# Patient Record
Sex: Female | Born: 1960
Health system: Southern US, Community
[De-identification: ages and names within clinical notes are randomized; demographics above are authoritative.]

## PROBLEM LIST (undated history)

## (undated) DIAGNOSIS — I10 Essential (primary) hypertension: Secondary | ICD-10-CM

## (undated) DIAGNOSIS — E669 Obesity, unspecified: Secondary | ICD-10-CM

## (undated) DIAGNOSIS — M329 Systemic lupus erythematosus, unspecified: Secondary | ICD-10-CM

## (undated) DIAGNOSIS — R000 Tachycardia, unspecified: Secondary | ICD-10-CM

## (undated) DIAGNOSIS — G4733 Obstructive sleep apnea (adult) (pediatric): Secondary | ICD-10-CM

## (undated) DIAGNOSIS — Z720 Tobacco use: Secondary | ICD-10-CM

## (undated) DIAGNOSIS — I77810 Thoracic aortic ectasia: Secondary | ICD-10-CM

## (undated) DIAGNOSIS — H9190 Unspecified hearing loss, unspecified ear: Secondary | ICD-10-CM

## (undated) DIAGNOSIS — E785 Hyperlipidemia, unspecified: Secondary | ICD-10-CM

## (undated) DIAGNOSIS — I2510 Atherosclerotic heart disease of native coronary artery without angina pectoris: Secondary | ICD-10-CM

## (undated) DIAGNOSIS — M797 Fibromyalgia: Secondary | ICD-10-CM

## (undated) DIAGNOSIS — I272 Pulmonary hypertension, unspecified: Secondary | ICD-10-CM

## (undated) HISTORY — DX: Tachycardia, unspecified: R00.0

## (undated) HISTORY — DX: Hyperlipidemia, unspecified: E78.5

## (undated) HISTORY — DX: Obesity, unspecified: E66.9

## (undated) HISTORY — PX: CHOLECYSTECTOMY: SHX55

## (undated) HISTORY — DX: Obstructive sleep apnea (adult) (pediatric): G47.33

## (undated) HISTORY — DX: Thoracic aortic ectasia: I77.810

## (undated) HISTORY — DX: Atherosclerotic heart disease of native coronary artery without angina pectoris: I25.10

## (undated) HISTORY — DX: Pulmonary hypertension, unspecified: I27.20

---

## 1998-08-03 ENCOUNTER — Emergency Department (HOSPITAL_COMMUNITY): Admission: EM | Admit: 1998-08-03 | Discharge: 1998-08-03 | Payer: Self-pay | Admitting: Emergency Medicine

## 1998-08-03 ENCOUNTER — Encounter: Payer: Self-pay | Admitting: Emergency Medicine

## 2012-12-16 ENCOUNTER — Other Ambulatory Visit: Payer: Self-pay | Admitting: Rheumatology

## 2012-12-16 ENCOUNTER — Ambulatory Visit
Admission: RE | Admit: 2012-12-16 | Discharge: 2012-12-16 | Disposition: A | Discharge status: Home / Self Care | Source: Ambulatory Visit | Attending: Rheumatology | Admitting: Rheumatology

## 2012-12-16 DIAGNOSIS — M79605 Pain in left leg: Secondary | ICD-10-CM

## 2012-12-16 DIAGNOSIS — R609 Edema, unspecified: Secondary | ICD-10-CM

## 2013-07-15 ENCOUNTER — Other Ambulatory Visit: Payer: Self-pay | Admitting: Pain Medicine

## 2013-07-15 ENCOUNTER — Ambulatory Visit
Admission: RE | Admit: 2013-07-15 | Discharge: 2013-07-15 | Disposition: A | Discharge status: Home / Self Care | Source: Ambulatory Visit | Attending: Pain Medicine | Admitting: Pain Medicine

## 2013-07-15 DIAGNOSIS — M25511 Pain in right shoulder: Secondary | ICD-10-CM

## 2013-07-15 DIAGNOSIS — M25562 Pain in left knee: Secondary | ICD-10-CM

## 2013-07-15 DIAGNOSIS — M542 Cervicalgia: Secondary | ICD-10-CM

## 2013-07-15 DIAGNOSIS — M25561 Pain in right knee: Secondary | ICD-10-CM

## 2013-08-09 ENCOUNTER — Other Ambulatory Visit: Payer: Self-pay | Admitting: Pain Medicine

## 2013-08-09 DIAGNOSIS — R51 Headache: Principal | ICD-10-CM

## 2013-08-09 DIAGNOSIS — R519 Headache, unspecified: Secondary | ICD-10-CM

## 2013-08-10 ENCOUNTER — Inpatient Hospital Stay
Admission: RE | Admit: 2013-08-10 | Discharge: 2013-08-10 | Disposition: A | Discharge status: Home / Self Care | Source: Ambulatory Visit | Attending: Pain Medicine | Admitting: Pain Medicine

## 2013-08-16 ENCOUNTER — Encounter (INDEPENDENT_AMBULATORY_CARE_PROVIDER_SITE_OTHER): Payer: Self-pay

## 2013-08-16 ENCOUNTER — Ambulatory Visit
Admission: RE | Admit: 2013-08-16 | Discharge: 2013-08-16 | Disposition: A | Discharge status: Home / Self Care | Source: Ambulatory Visit | Attending: Pain Medicine | Admitting: Pain Medicine

## 2013-08-16 DIAGNOSIS — R51 Headache: Principal | ICD-10-CM

## 2013-08-16 DIAGNOSIS — R519 Headache, unspecified: Secondary | ICD-10-CM

## 2013-10-03 ENCOUNTER — Other Ambulatory Visit: Payer: Self-pay | Admitting: Physician Assistant

## 2013-10-03 ENCOUNTER — Ambulatory Visit
Admission: RE | Admit: 2013-10-03 | Discharge: 2013-10-03 | Disposition: A | Discharge status: Home / Self Care | Source: Ambulatory Visit | Attending: Physician Assistant | Admitting: Physician Assistant

## 2013-10-03 DIAGNOSIS — R52 Pain, unspecified: Secondary | ICD-10-CM

## 2013-11-30 ENCOUNTER — Other Ambulatory Visit (HOSPITAL_COMMUNITY): Payer: Self-pay | Admitting: Rheumatology

## 2013-11-30 DIAGNOSIS — R609 Edema, unspecified: Secondary | ICD-10-CM

## 2013-12-01 ENCOUNTER — Ambulatory Visit (HOSPITAL_COMMUNITY): Attending: Rheumatology | Admitting: Radiology

## 2013-12-01 DIAGNOSIS — R609 Edema, unspecified: Secondary | ICD-10-CM | POA: Insufficient documentation

## 2013-12-01 DIAGNOSIS — I10 Essential (primary) hypertension: Secondary | ICD-10-CM | POA: Insufficient documentation

## 2013-12-01 NOTE — Progress Notes (Signed)
Echocardiogram performed.  

## 2014-01-12 ENCOUNTER — Ambulatory Visit (HOSPITAL_COMMUNITY)
Admission: RE | Admit: 2014-01-12 | Discharge: 2014-01-12 | Disposition: A | Discharge status: Home / Self Care | Source: Ambulatory Visit | Attending: Internal Medicine | Admitting: Internal Medicine

## 2014-01-12 ENCOUNTER — Encounter (HOSPITAL_COMMUNITY): Payer: Self-pay

## 2014-01-12 VITALS — BP 164/102 | HR 120 | Wt 253.8 lb

## 2014-01-12 DIAGNOSIS — I471 Supraventricular tachycardia: Secondary | ICD-10-CM

## 2014-01-12 DIAGNOSIS — I272 Pulmonary hypertension, unspecified: Secondary | ICD-10-CM

## 2014-01-12 DIAGNOSIS — R000 Tachycardia, unspecified: Secondary | ICD-10-CM | POA: Insufficient documentation

## 2014-01-12 DIAGNOSIS — I270 Primary pulmonary hypertension: Secondary | ICD-10-CM

## 2014-01-12 DIAGNOSIS — R0683 Snoring: Secondary | ICD-10-CM

## 2014-01-12 HISTORY — DX: Essential (primary) hypertension: I10

## 2014-01-12 HISTORY — DX: Fibromyalgia: M79.7

## 2014-01-12 HISTORY — DX: Morbid (severe) obesity due to excess calories: E66.01

## 2014-01-12 HISTORY — DX: Tobacco use: Z72.0

## 2014-01-12 HISTORY — DX: Unspecified hearing loss, unspecified ear: H91.90

## 2014-01-12 HISTORY — DX: Systemic lupus erythematosus, unspecified: M32.9

## 2014-01-12 MED ORDER — POTASSIUM CHLORIDE ER 20 MEQ PO TBCR: 20.0000 meq | EXTENDED_RELEASE_TABLET | Freq: Every day | ORAL | Status: DC

## 2014-01-12 MED ORDER — LISINOPRIL 40 MG PO TABS: 40.0000 mg | ORAL_TABLET | Freq: Every day | ORAL | Status: DC

## 2014-01-12 MED ORDER — FUROSEMIDE 20 MG PO TABS: 20.0000 mg | ORAL_TABLET | Freq: Every day | ORAL | Status: DC

## 2014-01-12 NOTE — Progress Notes (Signed)
Patient ID: Letta KocherRobin Short, female   DOB: 08/14/1960, 53 y.o.   MRN: [id number]  Referring Physician: Dr. Dareen PianoAnderson   HPI:  [name] is a 53 y/o morbidly obese woman with severe HTN, SLE, previous tobacco use (quit 7/15) and fibromyalgia referred by Dr. Dareen PianoAnderson for further evaluation of pulmonary HTN on echo.   Says she gained a lot of weight on prednisone 10 years. Since that time has become more short of breath. And also has had LE edema. Saw PCP and started on lasix. Says she can walk whole grocery store without stopping. + Orthopnea. Snores heavily and wakes herself up. No CP.   Used to smoke 1/2 ppd but quit 7/15    Echo 11/21/13: LVEF 50-55% mild lvh with grade 1 DD. RV normal. Trivial TR. RVSP 32   Review of Systems: [y] = yes, [ ]  = no   General: Weight gain [Y ]; Weight loss [ ] ; Anorexia [ ] ; Fatigue [Y]; Fever [ ] ; Chills [ ] ; Weakness [ ]   Cardiac: Chest pain/pressure [ ] ; Resting SOB [ ] ; Exertional SOB [Y ]; Orthopnea [Y ]; Pedal Edema [Y ]; Palpitations [ ] ; Syncope [ ] ; Presyncope [ ] ; Paroxysmal nocturnal dyspnea[ ]   Pulmonary: Cough [ ] ; Wheezing[ ] ; Hemoptysis[ ] ; Sputum [ ] ; Snoring [ ]   GI: Vomiting[ ] ; Dysphagia[ ] ; Melena[ ] ; Hematochezia [ ] ; Heartburn[ ] ; Abdominal pain [ ] ; Constipation [ ] ; Diarrhea [ ] ; BRBPR [ ]   GU: Hematuria[ ] ; Dysuria [ ] ; Nocturia[ ]   Vascular: Pain in legs with walking [ ] ; Pain in feet with lying flat [ ] ; Non-healing sores [ ] ; Stroke [ ] ; TIA [ ] ; Slurred speech [ ] ;  Neuro: Headaches[ ] ; Vertigo[ ] ; Seizures[ ] ; Paresthesias[ ] ;Blurred vision [ ] ; Diplopia [ ] ; Vision changes [ ]   Ortho/Skin: Arthritis [Y ]; Joint pain [Y ]; Muscle pain [ Y]; Joint swelling [ ] ; Back Pain [ ] ; Rash [Y ]  Psych: Depression[Y ]; Anxiety[ Y]  Heme: Bleeding problems [ ] ; Clotting disorders [ ] ; Anemia [ ]   Endocrine: Diabetes [ ] ; Thyroid dysfunction[ ]    Past Medical History  Diagnosis Date  . Morbid obesity   . Hypertension   . SLE (systemic lupus  erythematosus)   . Tobacco use   . Fibromyalgia     Current Outpatient Prescriptions  Medication Sig Dispense Refill  . chlorzoxazone (PARAFON) 500 MG tablet Take 500 mg by mouth 4 (four) times daily as needed for muscle spasms.      . cholecalciferol (VITAMIN D) 1000 UNITS tablet Take 1,000 Units by mouth daily.      . DULoxetine (CYMBALTA) 60 MG capsule Take 60 mg by mouth 2 (two) times daily.      . Fish Oil-Cholecalciferol (FISH OIL + D3 PO) Take by mouth.      . furosemide (LASIX) 20 MG tablet Take 20 mg by mouth daily.      . hydroxychloroquine (PLAQUENIL) 200 MG tablet Take 200 mg by mouth daily.      Marland Kitchen. lisinopril (PRINIVIL,ZESTRIL) 20 MG tablet Take 20 mg by mouth daily.      . mirtazapine (REMERON) 30 MG tablet Take 15 mg by mouth at bedtime.      . nortriptyline (PAMELOR) 50 MG capsule Take 50 mg by mouth at bedtime.      Marland Kitchen. nystatin (MYCOSTATIN) 100000 UNIT/ML suspension Take 5 mLs by mouth 4 (four) times daily.      . potassium chloride (K-DUR) 10 MEQ tablet Take  10 mEq by mouth daily.      . predniSONE (DELTASONE) 10 MG tablet Take 10 mg by mouth 2 (two) times daily with a meal.      . tapentadol (NUCYNTA) 50 MG TABS tablet Take 25 mg by mouth every 8 (eight) hours.      . traZODone (DESYREL) 50 MG tablet Take 50 mg by mouth at bedtime.       No current facility-administered medications for this encounter.    No Known Allergies    History   Social History  . Marital Status: Legally Separated    Spouse Name: N/A    Number of Children: 1  . Years of Education: N/A   Occupational History  . Disabled    Social History Main Topics  . Smoking status: Former Games developermoker  . Smokeless tobacco: Not on file  . Alcohol Use: No  . Drug Use: No  . Sexual Activity: Not on file   Other Topics Concern  . Not on file   Social History Narrative  . No narrative on file      Family History  Problem Relation Age of Onset  . Coronary artery disease Father   Mother with  SLE  Filed Vitals:   01/12/14 1202  BP: 164/102  Pulse: 120  Weight: 253 lb 12.8 oz (115.123 kg)  SpO2: 97%   Ambulatory sats 94-97% with hall walk.  PHYSICAL EXAM: General:  Obese female No respiratory difficulty HEENT: normal Neck: supple.JVP 9  Carotids 2+ bilat; no bruits. No lymphadenopathy or thryomegaly appreciated. Airway IV Cor: PMI nonpalpable  Tachy regular. No rubs, gallops or murmurs. Lungs: clear Abdomen: obese soft, nontender, nondistended. No hepatosplenomegaly. No bruits or masses. Good bowel sounds. Extremities: no cyanosis, clubbing, rash, 1+ edema Neuro: alert & oriented x 3, cranial nerves grossly intact. moves all 4 extremities w/o difficulty. Affect pleasant.  ECG:   ASSESSMENT & PLAN: 1. Morbid obesity 2. Severe HTN 3. Pulmonary HTN by echo 4. Probable OSA  She does have mild pulmonary HTN by echo but I suspect this is more related to her obesity, diastolic HF, severe HTN and OSA than her lupus. She is currently fluid overloaded and quite hypertensive. Will do the following:  1) Increase lasix to 40 daily. Increase KCL 20 to 20 daily 2) Increase lisinopril to 40 daily 3) Refer for sleep study 4) Place support hose 5) Encouraged her to lose weight with low carb diet 6) Check PFTs  7) BMET in 1 week 8) Watch fluid intake  See back 3-4 weeks for titration of anti-HTN meds. Can repeat echo in several months and if pulmonary pressures still up can consider RHC.   Reuel BoomDaniel Bensimhon,MD 12:34 PM

## 2014-01-12 NOTE — Patient Instructions (Signed)
Increase Furosemide (Lasix) to 40 mg daily  Increase Potassium to 20 meq daily  Increase Lisinopril to 40 mg daily  Labs in 1 week  You have been referred to Pulmonary for a sleep evaluation, Dr Shelle Ironlance 02/21/14 at 3 pm, please arrive at 2:45 for paperwork  Your physician has recommended that you have a pulmonary function test. Pulmonary Function Tests are a group of tests that measure how well air moves in and out of your lungs.  Your physician recommends that you schedule a follow-up appointment in: 3-4 weeks

## 2014-01-24 ENCOUNTER — Ambulatory Visit (HOSPITAL_COMMUNITY)
Admission: RE | Admit: 2014-01-24 | Discharge: 2014-01-24 | Disposition: A | Discharge status: Home / Self Care | Source: Ambulatory Visit | Attending: Cardiology | Admitting: Cardiology

## 2014-01-24 ENCOUNTER — Ambulatory Visit (HOSPITAL_COMMUNITY)
Admission: RE | Admit: 2014-01-24 | Discharge: 2014-01-24 | Disposition: A | Discharge status: Home / Self Care | Source: Ambulatory Visit | Attending: Internal Medicine | Admitting: Internal Medicine

## 2014-01-24 DIAGNOSIS — I5022 Chronic systolic (congestive) heart failure: Secondary | ICD-10-CM | POA: Diagnosis present

## 2014-01-24 DIAGNOSIS — I270 Primary pulmonary hypertension: Secondary | ICD-10-CM | POA: Diagnosis not present

## 2014-01-24 DIAGNOSIS — I272 Pulmonary hypertension, unspecified: Secondary | ICD-10-CM

## 2014-01-24 LAB — PULMONARY FUNCTION TEST
DL/VA % pred: 105 %
DL/VA: 5.21 ml/min/mmHg/L
DLCO UNC: 22.22 ml/min/mmHg
DLCO cor % pred: 86 %
DLCO cor: 22.22 ml/min/mmHg
DLCO unc % pred: 86 %
FEF 25-75 Post: 3.02 L/sec
FEF 25-75 Pre: 2.65 L/sec
FEF2575-%Change-Post: 13 %
FEF2575-%PRED-PRE: 99 %
FEF2575-%Pred-Post: 113 %
FEV1-%Change-Post: 1 %
FEV1-%Pred-Post: 88 %
FEV1-%Pred-Pre: 86 %
FEV1-POST: 2.48 L
FEV1-Pre: 2.44 L
FEV1FVC-%Change-Post: 1 %
FEV1FVC-%Pred-Pre: 104 %
FEV6-%CHANGE-POST: 0 %
FEV6-%PRED-PRE: 84 %
FEV6-%Pred-Post: 84 %
FEV6-PRE: 2.94 L
FEV6-Post: 2.95 L
FEV6FVC-%Change-Post: 0 %
FEV6FVC-%PRED-POST: 103 %
FEV6FVC-%Pred-Pre: 102 %
FVC-%Change-Post: 0 %
FVC-%PRED-POST: 82 %
FVC-%Pred-Pre: 82 %
FVC-POST: 2.95 L
FVC-Pre: 2.95 L
PRE FEV6/FVC RATIO: 100 %
Post FEV1/FVC ratio: 84 %
Post FEV6/FVC ratio: 100 %
Pre FEV1/FVC ratio: 83 %
RV % PRED: 66 %
RV: 1.27 L
TLC % pred: 82 %
TLC: 4.27 L

## 2014-01-24 LAB — BASIC METABOLIC PANEL
ANION GAP: 10 (ref 5–15)
BUN: 13 mg/dL (ref 6–23)
CHLORIDE: 100 meq/L (ref 96–112)
CO2: 27 meq/L (ref 19–32)
Calcium: 8.9 mg/dL (ref 8.4–10.5)
Creatinine, Ser: 0.83 mg/dL (ref 0.50–1.10)
GFR calc Af Amer: >90 mL/min (ref >90)
GFR calc non Af Amer: 79 mL/min — ABNORMAL LOW (ref >90)
Glucose, Bld: 85 mg/dL (ref 70–99)
Potassium: 5.8 mEq/L — ABNORMAL HIGH (ref 3.7–5.3)
Sodium: 137 mEq/L (ref 137–147)

## 2014-01-24 MED ORDER — ALBUTEROL SULFATE (2.5 MG/3ML) 0.083% IN NEBU
2.5000 mg | INHALATION_SOLUTION | Freq: Once | RESPIRATORY_TRACT | Status: AC
  Administered 2014-01-24: 2.5 mg via RESPIRATORY_TRACT

## 2014-02-01 ENCOUNTER — Ambulatory Visit (HOSPITAL_COMMUNITY)
Admission: RE | Admit: 2014-02-01 | Discharge: 2014-02-01 | Disposition: A | Discharge status: Home / Self Care | Source: Ambulatory Visit | Attending: Cardiology | Admitting: Cardiology

## 2014-02-01 VITALS — BP 140/84 | HR 98 | Wt 257.5 lb

## 2014-02-01 DIAGNOSIS — H9190 Unspecified hearing loss, unspecified ear: Secondary | ICD-10-CM | POA: Diagnosis not present

## 2014-02-01 DIAGNOSIS — I5032 Chronic diastolic (congestive) heart failure: Secondary | ICD-10-CM | POA: Insufficient documentation

## 2014-02-01 DIAGNOSIS — I272 Other secondary pulmonary hypertension: Secondary | ICD-10-CM | POA: Diagnosis not present

## 2014-02-01 DIAGNOSIS — Z79899 Other long term (current) drug therapy: Secondary | ICD-10-CM | POA: Diagnosis not present

## 2014-02-01 DIAGNOSIS — M797 Fibromyalgia: Secondary | ICD-10-CM | POA: Insufficient documentation

## 2014-02-01 DIAGNOSIS — I5030 Unspecified diastolic (congestive) heart failure: Secondary | ICD-10-CM | POA: Insufficient documentation

## 2014-02-01 DIAGNOSIS — I10 Essential (primary) hypertension: Secondary | ICD-10-CM | POA: Insufficient documentation

## 2014-02-01 DIAGNOSIS — M329 Systemic lupus erythematosus, unspecified: Secondary | ICD-10-CM | POA: Diagnosis not present

## 2014-02-01 DIAGNOSIS — Z87891 Personal history of nicotine dependence: Secondary | ICD-10-CM | POA: Diagnosis not present

## 2014-02-01 LAB — BASIC METABOLIC PANEL
Anion gap: 14 (ref 5–15)
BUN: 9 mg/dL (ref 6–23)
CHLORIDE: 101 meq/L (ref 96–112)
CO2: 23 meq/L (ref 19–32)
Calcium: 9.6 mg/dL (ref 8.4–10.5)
Creatinine, Ser: 0.75 mg/dL (ref 0.50–1.10)
GFR calc Af Amer: >90 mL/min (ref >90)
GFR calc non Af Amer: >90 mL/min (ref >90)
GLUCOSE: 89 mg/dL (ref 70–99)
POTASSIUM: 5 meq/L (ref 3.7–5.3)
Sodium: 138 mEq/L (ref 137–147)

## 2014-02-01 LAB — PRO B NATRIURETIC PEPTIDE: PRO B NATRI PEPTIDE: 55.1 pg/mL (ref 0–125)

## 2014-02-01 MED ORDER — FUROSEMIDE 40 MG PO TABS: ORAL_TABLET | ORAL | Status: DC

## 2014-02-01 MED ORDER — FUROSEMIDE 40 MG PO TABS: 40.0000 mg | ORAL_TABLET | Freq: Every day | ORAL | Status: DC

## 2014-02-01 MED ORDER — AMLODIPINE BESYLATE 5 MG PO TABS: 5.0000 mg | ORAL_TABLET | Freq: Every day | ORAL | Status: DC

## 2014-02-01 NOTE — Patient Instructions (Signed)
Start Amlodipine 5 mg daily  Increase Furosemide (Lasix) to 40 mg (1 tab) in AM and 20 mg (1/2 tab) in PM  Labs today  Your physician recommends that you schedule a follow-up appointment in: 1 month

## 2014-02-01 NOTE — Progress Notes (Signed)
Patient ID: Kylie KocherRobin [name], female   DOB: 11/14/1960, 53 y.o.   MRN: [id number]  Referring Physician: Dr. Dareen PianoAnderson  HPI:  Kylie [name] is a 53 y/o morbidly obese woman with severe HTN, SLE, previous tobacco use (quit 7/15) and fibromyalgia referred by Dr. Dareen PianoAnderson for further evaluation of pulmonary HTN on echo.   Says she gained a lot of weight on prednisone 10 years. Since that time has become more [name]. And also has had LE edema. Saw PCP and started on lasix.   Used to smoke 1/2 ppd but quit 7/15   At last appointment, Lasix was increased to 40 mg daily due to volume overload and lisinopril was increased to control blood pressure.  She says that her lower extremity edema is improved.  She is wearing support hose.  She is [name] walking up steps or hills.  She can walk on flat ground without dyspnea.  SBP still runs in the 150s when she checks at home.   PFTs in 11/15 were normal.   Echo 11/21/13: LVEF 50-55% mild lvh with grade 1 DD. RV normal. Trivial TR. RVSP 32  Labs (11/15): K 5.8 (?hemolyzed), creatinine 0.83  Review of Systems: All systems reviewed and negative except as per HPI.   Past Medical History  Diagnosis Date  . Morbid obesity   . Hypertension   . SLE (systemic lupus erythematosus)   . Tobacco use   . Fibromyalgia   . Hearing loss     Current Outpatient Prescriptions  Medication Sig Dispense Refill  . chlorzoxazone (PARAFON) 500 MG tablet Take 500 mg by mouth 4 (four) times daily as needed for muscle spasms.    . cholecalciferol (VITAMIN D) 1000 UNITS tablet Take 1,000 Units by mouth daily.    . DULoxetine (CYMBALTA) 60 MG capsule Take 60 mg by mouth 2 (two) times daily.    . Fish Oil-Cholecalciferol (FISH OIL + D3 PO) Take by mouth.    . furosemide (LASIX) 40 MG tablet Take 1 tab in AM and 1/2 tab in PM 45 tablet 3  . hydroxychloroquine (PLAQUENIL) 200 MG tablet Take 200 mg by mouth daily.    . indomethacin (INDOCIN) 25 MG capsule Take 25 mg by mouth 3  (three) times daily as needed.    Marland Kitchen. lisinopril (PRINIVIL,ZESTRIL) 40 MG tablet Take 1 tablet (40 mg total) by mouth daily.    . mirtazapine (REMERON) 30 MG tablet Take 15 mg by mouth at bedtime.    . nortriptyline (PAMELOR) 50 MG capsule Take 50 mg by mouth at bedtime.    Marland Kitchen. nystatin (MYCOSTATIN) 100000 UNIT/ML suspension Take 5 mLs by mouth 4 (four) times daily.    . potassium chloride 20 MEQ TBCR Take 20 mEq by mouth daily.    . predniSONE (DELTASONE) 10 MG tablet Take 10 mg by mouth daily with breakfast.     . tapentadol (NUCYNTA) 50 MG TABS tablet Take 25 mg by mouth every 8 (eight) hours.    . traZODone (DESYREL) 50 MG tablet Take 50 mg by mouth at bedtime.    Marland Kitchen. amLODipine (NORVASC) 5 MG tablet Take 1 tablet (5 mg total) by mouth daily. 30 tablet 3   No current facility-administered medications for this encounter.    No Known Allergies    History   Social History  . Marital Status: Legally Separated    Spouse Name: N/A    Number of Children: 1  . Years of Education: N/A   Occupational History  .  Disabled    Social History Main Topics  . Smoking status: Former Games developermoker  . Smokeless tobacco: Not on file  . Alcohol Use: No  . Drug Use: No  . Sexual Activity: Not on file   Other Topics Concern  . Not on file   Social History Narrative  . No narrative on file      Family History  Problem Relation Age of Onset  . Coronary artery disease Father   Mother with SLE  Filed Vitals:   02/01/14 0959  BP: 140/84  Pulse: 98  Weight: 257 lb 8 oz (116.801 kg)  SpO2: 100%   Ambulatory sats 94-97% with hall walk at 10/15 appt.  PHYSICAL EXAM: General:  Obese female No respiratory difficulty HEENT: normal Neck: supple.  JVP 8 cm. Carotids 2+ bilat; no bruits. No lymphadenopathy or thryomegaly appreciated.  Cor: PMI nonpalpable  Regular S1S2. No rubs, gallops or murmurs. Lungs: clear Abdomen: obese soft, nontender, nondistended. No hepatosplenomegaly. No bruits or masses.  Good bowel sounds. Extremities: no cyanosis, clubbing, rash, 1+ edema 1/2 up lower legs bilaterally Neuro: alert & oriented x 3, cranial nerves grossly intact. moves all 4 extremities w/o difficulty. Affect pleasant.   ASSESSMENT & PLAN: 1. Morbid obesity 2. Severe HTN 3. Pulmonary HTN by echo (mild) 4. Probable OSA 5. Chronic diastolic CHF  She does have mild pulmonary HTN by echo but I suspect this is more related to her obesity, diastolic HF, severe HTN and OSA than her lupus. At last appointment, Lasix was increased and lisinopril was increased to control BP better.  She still has some volume overload on exam and BP still runs high.  I plan the following:   1) Increase lasix to 40 qam, 20 qpm. BMET/BNP today (suspect K on last BMET was hemolyzed).  2) Continue lisinopril, add amlodipine 5 mg daily and watch for worsening peripheral edema. She will take her BP daily and record, bringing BPs to next appt.  3) Awaiting sleep study.  4) Continue use of support hose.  5) Encouraged her to lose weight with low carb diet  See back 1 month for titration of anti-HTN meds and reassessment of volume. Can repeat echo in several months and if pulmonary pressures still up can consider RHC.   Michelina Mexicano,MD 02/01/2014

## 2014-02-13 ENCOUNTER — Telehealth (HOSPITAL_COMMUNITY): Payer: Self-pay | Admitting: Vascular Surgery

## 2014-02-13 DIAGNOSIS — I5022 Chronic systolic (congestive) heart failure: Secondary | ICD-10-CM

## 2014-02-13 NOTE — Telephone Encounter (Signed)
REFILL Lisinopril

## 2014-02-14 MED ORDER — LISINOPRIL 40 MG PO TABS: 40.0000 mg | ORAL_TABLET | Freq: Every day | ORAL | Status: DC

## 2014-02-21 ENCOUNTER — Institutional Professional Consult (permissible substitution): Admitting: Pulmonary Disease

## 2014-03-03 ENCOUNTER — Ambulatory Visit (HOSPITAL_COMMUNITY)
Admission: RE | Admit: 2014-03-03 | Discharge: 2014-03-03 | Disposition: A | Discharge status: Home / Self Care | Source: Ambulatory Visit | Attending: Internal Medicine | Admitting: Internal Medicine

## 2014-03-03 VITALS — BP 122/78 | HR 109 | Wt 260.8 lb

## 2014-03-03 DIAGNOSIS — Z79899 Other long term (current) drug therapy: Secondary | ICD-10-CM | POA: Insufficient documentation

## 2014-03-03 DIAGNOSIS — Z87891 Personal history of nicotine dependence: Secondary | ICD-10-CM | POA: Diagnosis not present

## 2014-03-03 DIAGNOSIS — I10 Essential (primary) hypertension: Secondary | ICD-10-CM | POA: Diagnosis not present

## 2014-03-03 DIAGNOSIS — G4733 Obstructive sleep apnea (adult) (pediatric): Secondary | ICD-10-CM | POA: Insufficient documentation

## 2014-03-03 DIAGNOSIS — I272 Other secondary pulmonary hypertension: Secondary | ICD-10-CM | POA: Insufficient documentation

## 2014-03-03 DIAGNOSIS — I5032 Chronic diastolic (congestive) heart failure: Secondary | ICD-10-CM | POA: Diagnosis present

## 2014-03-03 LAB — BASIC METABOLIC PANEL
Anion gap: 13 (ref 5–15)
BUN: 10 mg/dL (ref 6–23)
CALCIUM: 9.5 mg/dL (ref 8.4–10.5)
CO2: 28 mEq/L (ref 19–32)
Chloride: 98 mEq/L (ref 96–112)
Creatinine, Ser: 0.73 mg/dL (ref 0.50–1.10)
GLUCOSE: 85 mg/dL (ref 70–99)
POTASSIUM: 3.7 meq/L (ref 3.7–5.3)
SODIUM: 139 meq/L (ref 137–147)

## 2014-03-03 MED ORDER — FUROSEMIDE 40 MG PO TABS
40.0000 mg | ORAL_TABLET | Freq: Two times a day (BID) | ORAL | Status: DC
Start: 2014-03-03 — End: 2014-09-26

## 2014-03-03 NOTE — Patient Instructions (Signed)
Increase Furosemide (Lasix) to 40 mg Twice daily   Labs today  Lab in 2 weeks (bmet)  Limit your Sodium intake to less than 2000 mg daily, see education sheet  You have been referred to Dr [name] for a sleep study  We will contact you in 2 months to schedule your next appointment.

## 2014-03-05 NOTE — Progress Notes (Signed)
Patient ID: Kylie [name], female   DOB: 04/22/1960, 53 y.o.   MRN: [id number]  Referring Physician: Dr. Dareen PianoAnderson  HPI:  Kylie [name] is a 53 y/o morbidly obese woman with severe HTN, SLE, previous tobacco use (quit 7/15) and fibromyalgia referred by Dr. Dareen PianoAnderson for further evaluation of pulmonary HTN on echo.   Says she gained a lot of weight on prednisone 10 years. Since that time has become more [name]. And also has had LE edema. Saw PCP and started on lasix.   Used to smoke 1/2 ppd but quit 7/15   At last appt, Lasix increased to 40 qam, 20 qpm.  She actually has gained 3 lbs.  No dyspnea walking on flat ground, mild dyspnea going up steps.  BP is controlled.   ECG: NSR, normal  PFTs in 11/15 were normal.   Echo 11/21/13: LVEF 50-55% mild lvh with grade 1 DD. RV normal. Trivial TR. RVSP 32  Labs (11/15): K 5.8 (?hemolyzed) => 5.0, creatinine 0.83 => 0.75, BNP 55  Review of Systems: All systems reviewed and negative except as per HPI.   Past Medical History  Diagnosis Date  . Morbid obesity   . Hypertension   . SLE (systemic lupus erythematosus)   . Tobacco use   . Fibromyalgia   . Hearing loss     Current Outpatient Prescriptions  Medication Sig Dispense Refill  . amLODipine (NORVASC) 5 MG tablet Take 1 tablet (5 mg total) by mouth daily. 30 tablet 3  . chlorzoxazone (PARAFON) 500 MG tablet Take 500 mg by mouth 4 (four) times daily as needed for muscle spasms.    . cholecalciferol (VITAMIN D) 1000 UNITS tablet Take 1,000 Units by mouth daily.    . DULoxetine (CYMBALTA) 60 MG capsule Take 60 mg by mouth daily.     . Fish Oil-Cholecalciferol (FISH OIL + D3 PO) Take by mouth.    . furosemide (LASIX) 40 MG tablet Take 1 tablet (40 mg total) by mouth 2 (two) times daily. 60 tablet 3  . hydroxychloroquine (PLAQUENIL) 200 MG tablet Take 400 mg by mouth daily.     . indomethacin (INDOCIN) 25 MG capsule Take 25 mg by mouth 3 (three) times daily as needed.    Marland Kitchen. lisinopril  (PRINIVIL,ZESTRIL) 40 MG tablet Take 1 tablet (40 mg total) by mouth daily. 30 tablet 6  . mirtazapine (REMERON) 30 MG tablet Take 15 mg by mouth at bedtime.    . nortriptyline (PAMELOR) 50 MG capsule Take 50 mg by mouth at bedtime.    Marland Kitchen. nystatin (MYCOSTATIN) 100000 UNIT/ML suspension Take 5 mLs by mouth 4 (four) times daily.    . predniSONE (DELTASONE) 10 MG tablet Take 10 mg by mouth daily with breakfast.     . tapentadol (NUCYNTA) 50 MG TABS tablet Take 25 mg by mouth every 8 (eight) hours.    . traZODone (DESYREL) 50 MG tablet Take 50 mg by mouth at bedtime.     No current facility-administered medications for this encounter.    Allergies  Allergen Reactions  . Amoxicillin   . Sulfa Antibiotics       History   Social History  . Marital Status: Legally Separated    Spouse Name: N/A    Number of Children: 1  . Years of Education: N/A   Occupational History  . Disabled    Social History Main Topics  . Smoking status: Former Games developermoker  . Smokeless tobacco: Not on file  . Alcohol Use: No  .  Drug Use: No  . Sexual Activity: Not on file   Other Topics Concern  . Not on file   Social History Narrative  . No narrative on file      Family History  Problem Relation Age of Onset  . Coronary artery disease Father   Mother with SLE  Filed Vitals:   03/03/14 0931  BP: 122/78  Pulse: 109  Weight: 260 lb 12 oz (118.275 kg)  SpO2: 98%   Ambulatory sats 94-97% with hall walk at 10/15 appt.  PHYSICAL EXAM: General:  Obese female No respiratory difficulty HEENT: normal Neck: supple.  JVP 8 cm. Carotids 2+ bilat; no bruits. No lymphadenopathy or thryomegaly appreciated.  Cor: PMI nonpalpable  Regular S1S2. No rubs, gallops or murmurs. Lungs: clear Abdomen: obese soft, nontender, nondistended. No hepatosplenomegaly. No bruits or masses. Good bowel sounds. Extremities: no cyanosis, clubbing, rash, 1+ edema 1/2 up lower legs bilaterally Neuro: alert & oriented x 3, cranial  nerves grossly intact. moves all 4 extremities w/o difficulty. Affect pleasant.   ASSESSMENT & PLAN: 1. Morbid obesity 2. Severe HTN 3. Pulmonary HTN by echo (mild) 4. Probable OSA 5. Chronic diastolic CHF  She does have mild pulmonary HTN by echo but I suspect this is more related to her obesity, diastolic HF, severe HTN and OSA than her lupus. At last appointment, Lasix was increased and amlodipine was added.  BP is now controlled but she remains volume overloaded.   1) Increase lasix to 40 mg bid. BMET today and in 2 wks.  Watch sodium intake.  2) Continue current BP meds.  3) Awaiting sleep study.  4) Continue use of support hose.  5) Encouraged her to lose weight with low carb diet  See back 2 months for titration of anti-HTN meds and reassessment of volume. Can repeat echo in several months and if pulmonary pressures still up can consider RHC.   [name],MD 03/05/2014

## 2014-03-13 ENCOUNTER — Ambulatory Visit (HOSPITAL_COMMUNITY)
Admission: RE | Admit: 2014-03-13 | Discharge: 2014-03-13 | Disposition: A | Discharge status: Home / Self Care | Source: Ambulatory Visit | Attending: Cardiology | Admitting: Cardiology

## 2014-03-13 DIAGNOSIS — I5032 Chronic diastolic (congestive) heart failure: Secondary | ICD-10-CM | POA: Insufficient documentation

## 2014-03-13 DIAGNOSIS — I5022 Chronic systolic (congestive) heart failure: Secondary | ICD-10-CM

## 2014-03-14 ENCOUNTER — Other Ambulatory Visit (HOSPITAL_COMMUNITY)

## 2014-03-29 ENCOUNTER — Institutional Professional Consult (permissible substitution): Admitting: Pulmonary Disease

## 2014-04-14 ENCOUNTER — Encounter: Payer: Self-pay | Admitting: Cardiology

## 2014-04-14 ENCOUNTER — Ambulatory Visit (INDEPENDENT_AMBULATORY_CARE_PROVIDER_SITE_OTHER): Admitting: Cardiology

## 2014-04-14 VITALS — BP 120/82 | HR 110 | Ht 65.0 in | Wt 264.8 lb

## 2014-04-14 DIAGNOSIS — I10 Essential (primary) hypertension: Secondary | ICD-10-CM

## 2014-04-14 DIAGNOSIS — E669 Obesity, unspecified: Secondary | ICD-10-CM

## 2014-04-14 DIAGNOSIS — R000 Tachycardia, unspecified: Secondary | ICD-10-CM

## 2014-04-14 DIAGNOSIS — I5032 Chronic diastolic (congestive) heart failure: Secondary | ICD-10-CM

## 2014-04-14 DIAGNOSIS — I272 Pulmonary hypertension, unspecified: Secondary | ICD-10-CM

## 2014-04-14 DIAGNOSIS — I270 Primary pulmonary hypertension: Secondary | ICD-10-CM

## 2014-04-14 HISTORY — DX: Pulmonary hypertension, unspecified: I27.20

## 2014-04-14 HISTORY — DX: Obesity, unspecified: E66.9

## 2014-04-14 HISTORY — DX: Tachycardia, unspecified: R00.0

## 2014-04-14 MED ORDER — DILTIAZEM HCL ER COATED BEADS 180 MG PO TB24: 180.0000 mg | ORAL_TABLET | Freq: Every day | ORAL | Status: DC

## 2014-04-14 NOTE — Patient Instructions (Addendum)
Your physician has recommended that you have a sleep study. This test records several body functions during sleep, including: brain activity, eye movement, oxygen and carbon dioxide blood levels, heart rate and rhythm, breathing rate and rhythm, the flow of air through your mouth and nose, snoring, body muscle movements, and chest and belly movement.  Your physician has recommended you make the following change in your medication:  1) STOP Norvasc 2) START Cardizem CD 180 mg daily  Your physician recommends that you schedule a follow-up appointment in: 2 weeks with Advanced Practice Provider to assess tolerance of medication change   [name] will see you as needed.

## 2014-04-14 NOTE — Progress Notes (Signed)
Cardiology Office Note   Date:  04/14/2014   ID:  Kylie [name], DOB 04/02/1960, MRN [id number]  PCP:  No primary care provider on file.  Cardiologist:   Dr. Marca Anconaalton McLean Sleep Medicine:  [name], MD   No chief complaint on file.  HPI:  Kylie [name] is a 54 y/o morbidly obese woman with severe HTN, SLE, previous tobacco use (quit 7/15) and fibromyalgia who was recently referred by Dr. Dareen PianoAnderson to [facility]  for further evaluation of pulmonary HTN on echo.   Says she gained a lot of weight on prednisone over 10 years. Since that time has become more [name]. And also has had LE edema. Saw PCP and started on lasix.   Used to smoke 1/2 ppd but quit 7/15   ECG: NSR, normal  PFTs in 11/15 were normal.   Echo 11/21/13: LVEF 50-55% mild lvh with grade 1 DD. RV normal. Trivial TR. RVSP 32  She has chronic diastolic CHF and mild pulnonary HTN by recent echo with PASP 32mmH felt multifactorial from diastolic HF, HTN, obesity and lupus.  Given her morbid obesity there was concern that she may have OSA as well and is now referred for further evaluation.  She says that she snores very loudy and will awaken with the snoring.  She also complains of very dry mouth and tongue soreness in the am.  She feels very unrested in the am. She will also awaken gasping for breath at night and wakes up sometimes in the am with headaches.  She has excessive daytime sleepiness and occasionally has to take a nap.     Past Medical History  Diagnosis Date  . Morbid obesity   . Hypertension   . SLE (systemic lupus erythematosus)   . Tobacco use   . Fibromyalgia   . Hearing loss   . Obesity (BMI 30-39.9) 04/14/2014  . Pulmonary HTN 04/14/2014    Past Surgical History  Procedure Laterality Date  . Cholecystectomy    . Cesarean section       Current Outpatient Prescriptions  Medication Sig Dispense Refill  . amLODipine (NORVASC) 5 MG tablet Take 1 tablet (5 mg total) by mouth daily.  30 tablet 3  . chlorzoxazone (PARAFON) 500 MG tablet Take 500 mg by mouth 4 (four) times daily as needed for muscle spasms.    . cholecalciferol (VITAMIN D) 1000 UNITS tablet Take 1,000 Units by mouth daily.    . DULoxetine (CYMBALTA) 60 MG capsule Take 60 mg by mouth daily.     . Fish Oil-Cholecalciferol (FISH OIL + D3 PO) Take by mouth.    . furosemide (LASIX) 40 MG tablet Take 1 tablet (40 mg total) by mouth 2 (two) times daily. 60 tablet 3  . hydroxychloroquine (PLAQUENIL) 200 MG tablet Take 400 mg by mouth daily.     . indomethacin (INDOCIN) 25 MG capsule Take 25 mg by mouth 3 (three) times daily as needed.    Marland Kitchen. lisinopril (PRINIVIL,ZESTRIL) 40 MG tablet Take 1 tablet (40 mg total) by mouth daily. 30 tablet 6  . mirtazapine (REMERON) 30 MG tablet Take 15 mg by mouth at bedtime.    . nortriptyline (PAMELOR) 50 MG capsule Take 50 mg by mouth at bedtime.    Marland Kitchen. nystatin (MYCOSTATIN) 100000 UNIT/ML suspension Take 5 mLs by mouth 4 (four) times daily.    . predniSONE (DELTASONE) 10 MG tablet Take 10 mg by mouth daily with breakfast.     . tapentadol (  NUCYNTA) 50 MG TABS tablet Take 25 mg by mouth every 8 (eight) hours.    . traZODone (DESYREL) 50 MG tablet Take 50 mg by mouth at bedtime.     No current facility-administered medications for this visit.    Allergies:   Amoxicillin and Sulfa antibiotics    Social History:  The patient  reports that she has quit smoking. She does not have any smokeless tobacco history on file. She reports that she does not drink alcohol or use illicit drugs.   Family History:  The patient's family history includes Coronary artery disease in her father.    ROS:  Please see the history of present illness.   Otherwise, review of systems are positive for none.   All other systems are reviewed and negative.    PHYSICAL EXAM: VS:  BP 120/82 mmHg  Pulse 110  Ht  (1.651 m)  Wt 264 lb 12.8 oz (120.112 kg)  BMI 44.06 kg/m2  SpO2 97% , BMI Body mass index is  44.06 kg/(m^2). GEN: Well nourished, well developed, in no acute distress HEENT: normal Neck: no JVD, carotid bruits, or masses Cardiac: RRR; tachycardic  no murmurs, rubs, or gallops,no edema  Respiratory:  clear to auscultation bilaterally, normal work of breathing GI: soft, nontender, nondistended, + BS MS: no deformity or atrophy Skin: warm and dry, no rash Neuro:  Strength and sensation are intact Psych: euthymic mood, full affect   EKG:  EKG is ordered today showing sinus tachycardia at 106bpm with no ST changes    Recent Labs: 02/01/2014: Pro B Natriuretic peptide (BNP) 55.1 03/03/2014: BUN 10; Creatinine 0.73; Potassium 3.7; Sodium 139    Lipid Panel No results found for: CHOL, TRIG, HDL, CHOLHDL, VLDL, LDLCALC, LDLDIRECT    Wt Readings from Last 3 Encounters:  04/14/14 264 lb 12.8 oz (120.112 kg)  03/03/14 260 lb 12 oz (118.275 kg)  02/01/14 257 lb 8 oz (116.801 kg)      Other studies Reviewed: Additional studies/ records that were reviewed today include: 2D echo and OV notes from CHF clinic. Review of the above records demonstrates: mild pulmonary HTN   ASSESSMENT AND PLAN:  1.  Mild pulmonary HTN most likely multifactorial from obesity, lupus, diastolic HF and HTN, but also some concern for possible OSA given her obesity. 2.  Morbid obesity 3.  HTN - controlled.  Continue lisinopril.  Changing from Amlodipine to cardizem due to tachcyardia 4.  Sinus tachcyardia - Stop amlodipine and start Cardizem CD  daily. I will have her followupw with PA in 2 weeks to make sure she is doing ok on med changes  Current medicines are reviewed at length with the patient today.  The patient does not have concerns regarding medicines.  The following changes have been made:  no change  Labs/ tests ordered today include: Split night PSG     Disposition:   FU with me PRN pending results of sleep study   Harlon Flor, MD  04/14/2014 11:40 AM    [facility] [address] [place], [place], [place]  [address] Phone: [phone]; Fax: [phone]

## 2014-05-01 ENCOUNTER — Ambulatory Visit: Admitting: Physician Assistant

## 2014-05-03 ENCOUNTER — Institutional Professional Consult (permissible substitution): Admitting: Pulmonary Disease

## 2014-05-05 ENCOUNTER — Telehealth (HOSPITAL_COMMUNITY): Payer: Self-pay

## 2014-05-05 NOTE — Telephone Encounter (Signed)
Patient called c/o SOB when lying down.  States she has to sit up and cough and feels "normal" then.  Breathing while sitting, standing, and ambulating okay.  Advised to take extra lasix pill x 2 das, then call us Monday morning to reassess.  Ave FilterBradley, Stefhanie Kachmar Genevea

## 2014-05-08 ENCOUNTER — Telehealth (HOSPITAL_COMMUNITY): Payer: Self-pay | Admitting: Vascular Surgery

## 2014-05-08 NOTE — Telephone Encounter (Signed)
Pt called she took the extra fluid pills over the weekend, she said he helped a little.Kylie [name]. She wants to know if there is something else she needs to do.. Please advise

## 2014-05-08 NOTE — Telephone Encounter (Signed)
Pt called to report she is feeling much better after taking extra lasix on Fri and Sat

## 2014-05-10 ENCOUNTER — Telehealth (HOSPITAL_COMMUNITY): Payer: Self-pay | Admitting: Vascular Surgery

## 2014-05-10 NOTE — Telephone Encounter (Signed)
That sounds ok. Cut back sodium and fluid intake.

## 2014-05-10 NOTE — Telephone Encounter (Signed)
Pt called her foot and leg is swollen .Marland Kitchen. Please advise

## 2014-05-10 NOTE — Telephone Encounter (Signed)
Pt reports increased LE edema Reports extra lasix helped over the weekend however swelling returned on Monday Pt reports she has been doing better with her diet however she may drink more than 2L in a day Advised she should keep fluid intake to LESS than 2L in a day  Ok to take extra lasix x 2 days and return to office on Friday  05/12/14 for labs  should we doing anything in addition?

## 2014-05-12 ENCOUNTER — Ambulatory Visit (HOSPITAL_COMMUNITY)
Admission: RE | Admit: 2014-05-12 | Discharge: 2014-05-12 | Disposition: A | Discharge status: Home / Self Care | Source: Ambulatory Visit | Attending: Internal Medicine | Admitting: Internal Medicine

## 2014-05-12 DIAGNOSIS — I5032 Chronic diastolic (congestive) heart failure: Secondary | ICD-10-CM | POA: Diagnosis present

## 2014-05-12 DIAGNOSIS — I5022 Chronic systolic (congestive) heart failure: Secondary | ICD-10-CM

## 2014-05-12 LAB — BASIC METABOLIC PANEL
ANION GAP: 7 (ref 5–15)
BUN: 9 mg/dL (ref 6–23)
CHLORIDE: 97 mmol/L (ref 96–112)
CO2: 32 mmol/L (ref 19–32)
CREATININE: 0.86 mg/dL (ref 0.50–1.10)
Calcium: 8.9 mg/dL (ref 8.4–10.5)
GFR calc Af Amer: 87 mL/min — ABNORMAL LOW (ref >90)
GFR calc non Af Amer: 75 mL/min — ABNORMAL LOW (ref >90)
Glucose, Bld: 105 mg/dL — ABNORMAL HIGH (ref 70–99)
Potassium: 3.1 mmol/L — ABNORMAL LOW (ref 3.5–5.1)
Sodium: 136 mmol/L (ref 135–145)

## 2014-05-21 ENCOUNTER — Other Ambulatory Visit: Payer: Self-pay | Admitting: Cardiology

## 2014-05-23 ENCOUNTER — Ambulatory Visit (HOSPITAL_COMMUNITY)
Admission: RE | Admit: 2014-05-23 | Discharge: 2014-05-23 | Disposition: A | Discharge status: Home / Self Care | Source: Ambulatory Visit | Attending: Cardiology | Admitting: Cardiology

## 2014-05-23 ENCOUNTER — Encounter (HOSPITAL_COMMUNITY): Payer: Self-pay | Admitting: Cardiology

## 2014-05-23 ENCOUNTER — Telehealth (HOSPITAL_COMMUNITY): Payer: Self-pay | Admitting: Cardiology

## 2014-05-23 ENCOUNTER — Encounter (HOSPITAL_COMMUNITY): Payer: Self-pay

## 2014-05-23 VITALS — BP 124/80 | HR 96 | Wt 270.5 lb

## 2014-05-23 DIAGNOSIS — I272 Other secondary pulmonary hypertension: Secondary | ICD-10-CM | POA: Diagnosis not present

## 2014-05-23 DIAGNOSIS — M797 Fibromyalgia: Secondary | ICD-10-CM | POA: Insufficient documentation

## 2014-05-23 DIAGNOSIS — I270 Primary pulmonary hypertension: Secondary | ICD-10-CM

## 2014-05-23 DIAGNOSIS — Z79899 Other long term (current) drug therapy: Secondary | ICD-10-CM | POA: Insufficient documentation

## 2014-05-23 DIAGNOSIS — I5032 Chronic diastolic (congestive) heart failure: Secondary | ICD-10-CM | POA: Diagnosis not present

## 2014-05-23 DIAGNOSIS — M329 Systemic lupus erythematosus, unspecified: Secondary | ICD-10-CM | POA: Insufficient documentation

## 2014-05-23 DIAGNOSIS — Z882 Allergy status to sulfonamides status: Secondary | ICD-10-CM | POA: Diagnosis not present

## 2014-05-23 DIAGNOSIS — I10 Essential (primary) hypertension: Secondary | ICD-10-CM | POA: Diagnosis not present

## 2014-05-23 DIAGNOSIS — Z87891 Personal history of nicotine dependence: Secondary | ICD-10-CM | POA: Diagnosis not present

## 2014-05-23 MED ORDER — POTASSIUM CHLORIDE ER 10 MEQ PO TBCR: 40.0000 meq | EXTENDED_RELEASE_TABLET | Freq: Every day | ORAL | Status: DC

## 2014-05-23 MED ORDER — CARVEDILOL 6.25 MG PO TABS: 6.2500 mg | ORAL_TABLET | Freq: Two times a day (BID) | ORAL | Status: DC

## 2014-05-23 NOTE — Telephone Encounter (Signed)
Pt scheduled for RHC on 05/31/14 with Dr.McLean Cpt code [id number] With pts current insurance- [employer] No pre cert required

## 2014-05-23 NOTE — Patient Instructions (Signed)
START Potassium 40 MeQ daily STOP Diltiazem START Coreg 6.25 mg, one tab twice a day  Your physician has requested that you have a cardiac catheterization. Cardiac catheterization is used to diagnose and/or treat various heart conditions. Doctors may recommend this procedure for a number of different reasons. The most common reason is to evaluate chest pain. Chest pain can be a symptom of coronary artery disease (CAD), and cardiac catheterization can show whether plaque is narrowing or blocking your heart's arteries. This procedure is also used to evaluate the valves, as well as measure the blood flow and oxygen levels in different parts of your heart. For further information please visit [email or web address] Please follow instruction sheet, as given.  Your physician recommends that you schedule a follow-up appointment in: 1 month  Do the following things EVERYDAY: 1) Weigh yourself in the morning before breakfast. Write it down and keep it in a log. 2) Take your medicines as prescribed 3) Eat low salt foods-Limit salt (sodium) to 2000 mg per day.  4) Stay as active as you can everyday 5) Limit all fluids for the day to less than 2 liters 6)

## 2014-05-24 NOTE — Progress Notes (Signed)
Patient ID: Kylie [name], female   DOB: 06/02/1960, 54 y.o.   MRN: [id number]  Referring Physician: Dr. Anderson  HPI:  Kylie [name] is a 54 y/o morbidly obese woman with severe HTN, SLE, previous tobacco use (quit 7/15) and fibromyalgia referred by Dr. Anderson for further evaluation of pulmonary HTN on echo.   Says she gained a lot of weight on prednisone for SLE.  She has developed exertional dyspnea and also has had LE edema. Saw PCP and started on lasix.   Used to smoke 1/2 ppd but quit 7/15   At last appt, Lasix increased to 40 mg bid.  She actually has gained 10 lbs.  No dyspnea walking on flat ground, some dyspnea going up steps.  BP is controlled.  She has worsened lower extremity edema.  She is on both amlodipine and diltiazem CD.  Diltiazem was added since my last visit with her.  She thinks that her lower extremity edema worsened after she started diltiazem.  No chest pain.  No orthopnea/PND.    PFTs in 11/15 were normal.   Echo 11/21/13: LVEF 50-55% mild lvh with grade 1 DD. RV normal. Trivial TR. RVSP 32  Labs (11/15): K 5.8 (?hemolyzed) => 5.0, creatinine 0.83 => 0.75, BNP 55 Labs (2/16): K 3.1, creatinine 0.86  Review of Systems: All systems reviewed and negative except as per HPI.   Past Medical History  Diagnosis Date  . Morbid obesity   . Hypertension   . SLE (systemic lupus erythematosus)   . Tobacco use   . Fibromyalgia   . Hearing loss   . Obesity (BMI 30-39.9) 04/14/2014  . Pulmonary HTN 04/14/2014  . Sinus tachycardia 04/14/2014    Current Outpatient Prescriptions  Medication Sig Dispense Refill  . amLODipine (NORVASC) 5 MG tablet TAKE ONE TABLET BY MOUTH ONE TIME DAILY 30 tablet 2  . chlorzoxazone (PARAFON) 500 MG tablet Take 500 mg by mouth 4 (four) times daily as needed for muscle spasms.    . cholecalciferol (VITAMIN D) 1000 UNITS tablet Take 1,000 Units by mouth daily.    . DULoxetine (CYMBALTA) 60 MG capsule Take 60 mg by mouth daily.     . furosemide (LASIX)  40 MG tablet Take 1 tablet (40 mg total) by mouth 2 (two) times daily. 60 tablet 3  . hydroxychloroquine (PLAQUENIL) 200 MG tablet Take 400 mg by mouth daily.     . indomethacin (INDOCIN) 25 MG capsule Take 25 mg by mouth 3 (three) times daily as needed.    . lisinopril (PRINIVIL,ZESTRIL) 40 MG tablet Take 1 tablet (40 mg total) by mouth daily. 30 tablet 6  . mirtazapine (REMERON) 30 MG tablet Take 15 mg by mouth at bedtime.    . nortriptyline (PAMELOR) 50 MG capsule Take 50 mg by mouth at bedtime.    . nystatin (MYCOSTATIN) 100000 UNIT/ML suspension Take 5 mLs by mouth 4 (four) times daily.    . predniSONE (DELTASONE) 10 MG tablet Take 10 mg by mouth daily with breakfast.     . tapentadol (NUCYNTA) 50 MG TABS tablet Take 25 mg by mouth every 8 (eight) hours.    . traZODone (DESYREL) 50 MG tablet Take 50 mg by mouth at bedtime.    . carvedilol (COREG) 6.25 MG tablet Take 1 tablet (6.25 mg total) by mouth 2 (two) times daily with a meal. 60 tablet 3  . Fish Oil-Cholecalciferol (FISH OIL + D3 PO) Take by mouth.    . potassium chloride (K-DUR) 10 MEQ tablet   Take 4 tablets (40 mEq total) by mouth daily. 120 tablet 3   No current facility-administered medications for this encounter.    Allergies  Allergen Reactions  . Amoxicillin   . Sulfa Antibiotics       History   Social History  . Marital Status: Legally Separated    Spouse Name: N/A  . Number of Children: 1  . Years of Education: 12   Occupational History  . Disabled    Social History Main Topics  . Smoking status: Former Smoker  . Smokeless tobacco: Not on file  . Alcohol Use: No  . Drug Use: No  . Sexual Activity: Not on file   Other Topics Concern  . Not on file   Social History Narrative      Family History  Problem Relation Age of Onset  . Coronary artery disease Father   Mother with SLE  Filed Vitals:   05/23/14 1150  BP: 124/80  Pulse: 96  Weight: 270 lb 8 oz (122.698 kg)  SpO2: 99%   Ambulatory  sats 94-97% with hall walk at 10/15 appt.  PHYSICAL EXAM: General:  Obese female No respiratory difficulty HEENT: normal Neck: Thick, JVP difficult. Carotids 2+ bilat; no bruits. No lymphadenopathy or thryomegaly appreciated.  Cor: PMI nonpalpable  Regular S1S2. No rubs, gallops or murmurs. Lungs: clear Abdomen: obese soft, nontender, nondistended. No hepatosplenomegaly. No bruits or masses. Good bowel sounds. Extremities: no cyanosis, clubbing, rash, 2+ edema to knees bilaterally Neuro: alert & oriented x 3, cranial nerves grossly intact. moves all 4 extremities w/o difficulty. Affect pleasant.  ASSESSMENT & PLAN: 1. Morbid obesity 2. Severe HTN 3. Pulmonary HTN by echo (mild) 4. Probable OSA 5. Chronic diastolic CHF  She does have mild pulmonary HTN by echo but I suspect this is more related to her obesity, diastolic HF, severe HTN and OSA than her lupus. At last appointment, Lasix was increased again.  Diltiazem CD was added but she was already on amlodipine.  Since then, she has worse lower extremity edema.  BP is now controlled.   1) Continue current Lasix.  At this point, I think that we need a right heart cath to sort out her filling pressures and assess PA pressure.  I am reluctant to increase Lasix again. I will arrange for RHC next week.  2) Stop diltiazem CD, continue amlodipine for now.  I will have her use Coreg 6.25 mg bid instead of diltiazem for BP control.  3) Wear knee-high compression stockings.  4) Awaiting sleep study: scheduled for 4/7.  5) Encouraged her to lose weight with low carb diet  Nesbit Michon 05/24/2014  

## 2014-05-29 ENCOUNTER — Ambulatory Visit (INDEPENDENT_AMBULATORY_CARE_PROVIDER_SITE_OTHER): Admitting: Physician Assistant

## 2014-05-29 ENCOUNTER — Encounter (HOSPITAL_COMMUNITY): Payer: Self-pay | Admitting: Pharmacy Technician

## 2014-05-29 ENCOUNTER — Encounter: Payer: Self-pay | Admitting: Physician Assistant

## 2014-05-29 VITALS — BP 115/72 | HR 94 | Ht 65.0 in

## 2014-05-29 DIAGNOSIS — E669 Obesity, unspecified: Secondary | ICD-10-CM

## 2014-05-29 DIAGNOSIS — I272 Pulmonary hypertension, unspecified: Secondary | ICD-10-CM

## 2014-05-29 DIAGNOSIS — I10 Essential (primary) hypertension: Secondary | ICD-10-CM

## 2014-05-29 DIAGNOSIS — I270 Primary pulmonary hypertension: Secondary | ICD-10-CM

## 2014-05-29 DIAGNOSIS — I5022 Chronic systolic (congestive) heart failure: Secondary | ICD-10-CM

## 2014-05-29 DIAGNOSIS — I5032 Chronic diastolic (congestive) heart failure: Secondary | ICD-10-CM

## 2014-05-29 LAB — CBC
HCT: 40.7 % (ref 36.0–46.0)
Hemoglobin: 13.7 g/dL (ref 12.0–15.0)
MCHC: 33.5 g/dL (ref 30.0–36.0)
MCV: 88.2 fl (ref 78.0–100.0)
Platelets: 234 10*3/uL (ref 150.0–400.0)
RBC: 4.62 Mil/uL (ref 3.87–5.11)
RDW: 13 % (ref 11.5–15.5)
WBC: 8.3 10*3/uL (ref 4.0–10.5)

## 2014-05-29 LAB — BASIC METABOLIC PANEL
BUN: 13 mg/dL (ref 6–23)
CALCIUM: 9.2 mg/dL (ref 8.4–10.5)
CO2: 34 meq/L — AB (ref 19–32)
CREATININE: 0.79 mg/dL (ref 0.40–1.20)
Chloride: 101 mEq/L (ref 96–112)
GFR: 80.58 mL/min (ref >60.00)
GLUCOSE: 86 mg/dL (ref 70–99)
Potassium: 3.7 mEq/L (ref 3.5–5.1)
Sodium: 138 mEq/L (ref 135–145)

## 2014-05-29 LAB — PROTIME-INR
INR: 0.9 ratio (ref 0.8–1.0)
PROTHROMBIN TIME: 10.4 s (ref 9.6–13.1)

## 2014-05-29 NOTE — Assessment & Plan Note (Signed)
Patient continues to have trouble with edema despite ongoing Lasix, TED hose and low sodium diet. I will not make any changes because she has a right heart cath in 2 days. Dr. Shirlee LatchMcLean can adjust her medications at that time. 2 g sodium diet. Check labs today for right heart catheterization on Wednesday.

## 2014-05-29 NOTE — Patient Instructions (Signed)
Your physician recommends that you continue on your current medications as directed. Please refer to the Current Medication list given to you today.     LABS WORK TO BE DONE  BMP CBC PT/ INR    FOLLOW UP WILL BE MADE AFTER CATH

## 2014-05-29 NOTE — Assessment & Plan Note (Signed)
Await results of right heart cath on Wednesday 3/16/ 16.

## 2014-05-29 NOTE — Assessment & Plan Note (Signed)
Blood pressure better?

## 2014-05-29 NOTE — Assessment & Plan Note (Signed)
Weight loss recommended 

## 2014-05-29 NOTE — Progress Notes (Signed)
Cardiology Office Note   Date:  05/29/2014   ID:  Kylie [name], DOB 1960/07/10, MRN [id number]  PCP:  Salley Slaughter, NP  Cardiologist:  Armanda Magic, M.D. CHF clinic: [facility]ca Ancona, M.D.  Chief Complaint: Edema    History of Present Illness: Kylie [name] is a 54 y.o. female who presents for follow-up of medication changes. She saw Dr. Mayford Knife on 04/14/14 for mild pulmonary hypertension felt secondary to obesity, lupus, diastolic heart failure, and hypertension as well as concerns for obstructive sleep apnea. She was changed from amlodipine to Cardizem due to tachycardia. She was then referred to Dr. Shirlee Latch in the heart failure clinic because of temp and weight gain despite increasing her Lasix to 40 mg twice a day. She had never stopped her amlodipine and was on it with the diltiazem. He   stopped her diltiazem, continued her amlodipine and added Coreg 6.25 mg twice a day instead of diltiazem for blood pressure control. He recommended a right heart catheterization to sort out her filling pressures and assess PA pressure. This is scheduled for this Wednesday. She is also scheduled for a sleep study on 06/22/14.  Patient has a history of severe hypertension, SLE, previous tobacco use quit in 09/2013, and fibromyalgia. She has had a lot of weight gain on prednisone for SLE.  Patient continues to have some dyspnea on exertion and edema. She says the swelling went down since stopping the Cardizem and she is wearing TED hose. Her weight is actually up 3 more pounds today. She says she has blood work done at the hospital on 05/19/14 but there is nothing in the computer to indicate this. Her last labs were on 05/12/14 the potassium was low at 3.1.   Past Medical History  Diagnosis Date  . Morbid obesity   . Hypertension   . SLE (systemic lupus erythematosus)   . Tobacco use   . Fibromyalgia   . Hearing loss   . Obesity (BMI 30-39.9) 04/14/2014  . Pulmonary HTN 04/14/2014  . Sinus tachycardia 04/14/2014     Past Surgical History  Procedure Laterality Date  . Cholecystectomy    . Cesarean section       Current Outpatient Prescriptions  Medication Sig Dispense Refill  . amLODipine (NORVASC) 5 MG tablet TAKE ONE TABLET BY MOUTH ONE TIME DAILY 30 tablet 2  . carvedilol (COREG) 6.25 MG tablet Take 1 tablet (6.25 mg total) by mouth 2 (two) times daily with a meal. 60 tablet 3  . chlorzoxazone (PARAFON) 500 MG tablet Take 500 mg by mouth 4 (four) times daily as needed for muscle spasms.    . cholecalciferol (VITAMIN D) 1000 UNITS tablet Take 1,000 Units by mouth daily.    . DULoxetine (CYMBALTA) 60 MG capsule Take 60 mg by mouth daily.     . Fish Oil-Cholecalciferol (FISH OIL + D3 PO) Take by mouth.    . furosemide (LASIX) 40 MG tablet Take 1 tablet (40 mg total) by mouth 2 (two) times daily. 60 tablet 3  . hydroxychloroquine (PLAQUENIL) 200 MG tablet Take 400 mg by mouth daily.     . indomethacin (INDOCIN) 25 MG capsule Take 25 mg by mouth 3 (three) times daily as needed.    Marland Kitchen lisinopril (PRINIVIL,ZESTRIL) 40 MG tablet Take 1 tablet (40 mg total) by mouth daily. 30 tablet 6  . mirtazapine (REMERON) 30 MG tablet Take 15 mg by mouth at bedtime.    . nortriptyline (PAMELOR) 50 MG capsule Take 50 mg by mouth at  bedtime.    Marland Kitchen. nystatin (MYCOSTATIN) 100000 UNIT/ML suspension Take 5 mLs by mouth 4 (four) times daily.    . potassium chloride (K-DUR) 10 MEQ tablet Take 4 tablets (40 mEq total) by mouth daily. 120 tablet 3  . predniSONE (DELTASONE) 10 MG tablet Take 10 mg by mouth daily with breakfast.     . tapentadol (NUCYNTA) 50 MG TABS tablet Take 25 mg by mouth every 8 (eight) hours.    . traZODone (DESYREL) 50 MG tablet Take 50 mg by mouth at bedtime.     No current facility-administered medications for this visit.    Allergies:   Amoxicillin and Sulfa antibiotics    Social History:  The patient  reports that she has quit smoking. She does not have any smokeless tobacco history on file. She  reports that she does not drink alcohol or use illicit drugs.   Family History:  The patient's family history includes Coronary artery disease in her father.    ROS:  Please see the history of present illness.   Otherwise, review of systems are positive for weight gain from the prednisone palpitations, facial swelling hearing loss orthopnea dyspnea on exertion depression chronic back pain and balance problems headaches and dizziness..   All other systems are reviewed and negative.    PHYSICAL EXAM: BP 115/72 mmHg  Pulse 94  Ht 5\' 5"  (1.651 m)  SpO2 97% weight 273 lbs. GEN: Obese, well developed, in no acute distress Neck: no JVD, HJR, carotid bruits, or masses Cardiac: RRR; distant heart sounds no gallop ,murmurs, rubs, thrill or heave,no edema,   Respiratory:  Decreased breath sounds but clear to auscultation bilaterally, normal work of breathing GI: soft, nontender, nondistended, + BS MS: no deformity or atrophy Extremities: +2-3 edema, TED hose on otherwise without cyanosis, clubbing, decreased distal pulses bilaterally.  Skin: warm and dry, no rash Neuro:  Strength and sensation are intact Psych: euthymic mood, full affect   EKG:  EKG is not ordered today.    Recent Labs: 02/01/2014: Pro B Natriuretic peptide (BNP) 55.1 05/12/2014: BUN 9; Creatinine 0.86; Potassium 3.1*; Sodium 136    Lipid Panel No results found for: CHOL, TRIG, HDL, CHOLHDL, VLDL, LDLCALC, LDLDIRECT    Wt Readings from Last 3 Encounters:  05/23/14 270 lb 8 oz (122.698 kg)  04/14/14 264 lb 12.8 oz (120.112 kg)  03/03/14 260 lb 12 oz (118.275 kg)      Other studies Reviewed: Additional studies/ records that were reviewed today include and review of the records demonstrates:  2-D echo 12/01/13:  Study Conclusions  - Left ventricle: The cavity size was normal. Wall thickness was   increased in a pattern of mild LVH. Systolic function was normal.   The estimated ejection fraction was in the range  of 50% to 55%.   Wall motion was normal; there were no regional wall motion   abnormalities. Doppler parameters are consistent with abnormal   left ventricular relaxation (grade 1 diastolic dysfunction). - Aortic valve: There was trivial regurgitation. - Pulmonary arteries: Systolic pressure was mildly increased. PA   peak pressure: 32 mm Hg (S).  Impressions:  - Low normal LV function; grade 1 diastolic dysfunction; trace AI,   MR and TR; mildly elevated pulmonary pressures.    ASSESSMENT AND PLAN: Chronic diastolic CHF (congestive heart failure) Patient continues to have trouble with edema despite ongoing Lasix, TED hose and low sodium diet. I will not make any changes because she has a right heart cath in  2 days. Dr. Shirlee Latch can adjust her medications at that time. 2 g sodium diet. Check labs today for right heart catheterization on Wednesday.   Obesity (BMI 30-39.9) Weight loss recommended.   Essential hypertension Blood pressure better.   Pulmonary HTN Await results of right heart cath on Wednesday 3/16/ 16.      Elson Clan, PA-C  05/29/2014 10:36 AM    [facility] [address] [place], [place], [place]  [address] Phone: [phone]; Fax: [phone]

## 2014-05-31 ENCOUNTER — Ambulatory Visit (HOSPITAL_COMMUNITY)
Admission: RE | Admit: 2014-05-31 | Discharge: 2014-05-31 | Disposition: A | Discharge status: Home / Self Care | Source: Ambulatory Visit | Attending: Cardiology | Admitting: Cardiology

## 2014-05-31 ENCOUNTER — Encounter (HOSPITAL_COMMUNITY)
Admission: RE | Disposition: A | Discharge status: Home / Self Care | Payer: Self-pay | Source: Ambulatory Visit | Attending: Cardiology

## 2014-05-31 ENCOUNTER — Encounter (HOSPITAL_COMMUNITY): Payer: Self-pay | Admitting: Cardiology

## 2014-05-31 DIAGNOSIS — Z880 Allergy status to penicillin: Secondary | ICD-10-CM | POA: Diagnosis not present

## 2014-05-31 DIAGNOSIS — Z87891 Personal history of nicotine dependence: Secondary | ICD-10-CM | POA: Diagnosis not present

## 2014-05-31 DIAGNOSIS — I10 Essential (primary) hypertension: Secondary | ICD-10-CM | POA: Insufficient documentation

## 2014-05-31 DIAGNOSIS — Z79899 Other long term (current) drug therapy: Secondary | ICD-10-CM | POA: Insufficient documentation

## 2014-05-31 DIAGNOSIS — Z882 Allergy status to sulfonamides status: Secondary | ICD-10-CM | POA: Diagnosis not present

## 2014-05-31 DIAGNOSIS — I509 Heart failure, unspecified: Secondary | ICD-10-CM

## 2014-05-31 HISTORY — PX: RIGHT HEART CATHETERIZATION: SHX5447

## 2014-05-31 LAB — POCT I-STAT 3, VENOUS BLOOD GAS (G3P V)
ACID-BASE EXCESS: 1 mmol/L (ref 0.0–2.0)
BICARBONATE: 26.8 meq/L — AB (ref 20.0–24.0)
O2 Saturation: 70 %
PH VEN: 7.383 — AB (ref 7.250–7.300)
TCO2: 28 mmol/L (ref 0–100)
pCO2, Ven: 45 mmHg (ref 45.0–50.0)
pO2, Ven: 38 mmHg (ref 30.0–45.0)

## 2014-05-31 SURGERY — RIGHT HEART CATH
Anesthesia: LOCAL

## 2014-05-31 MED ORDER — LIDOCAINE HCL (PF) 1 % IJ SOLN
INTRAMUSCULAR | Status: AC
  Filled 2014-05-31: qty 30

## 2014-05-31 MED ORDER — FENTANYL CITRATE 0.05 MG/ML IJ SOLN
INTRAMUSCULAR | Status: AC
  Filled 2014-05-31: qty 2

## 2014-05-31 MED ORDER — MIDAZOLAM HCL 2 MG/2ML IJ SOLN
INTRAMUSCULAR | Status: AC
  Filled 2014-05-31: qty 2

## 2014-05-31 MED ORDER — SODIUM CHLORIDE 0.9 % IV SOLN: 250.0000 mL | INTRAVENOUS | Status: DC | PRN

## 2014-05-31 MED ORDER — SODIUM CHLORIDE 0.9 % IV SOLN: INTRAVENOUS | Status: DC

## 2014-05-31 MED ORDER — HEPARIN (PORCINE) IN NACL 2-0.9 UNIT/ML-% IJ SOLN
INTRAMUSCULAR | Status: AC
  Filled 2014-05-31: qty 500

## 2014-05-31 MED ORDER — SODIUM CHLORIDE 0.9 % IJ SOLN: 3.0000 mL | INTRAMUSCULAR | Status: DC | PRN

## 2014-05-31 MED ORDER — SODIUM CHLORIDE 0.9 % IJ SOLN: 3.0000 mL | Freq: Two times a day (BID) | INTRAMUSCULAR | Status: DC

## 2014-05-31 NOTE — Interval H&P Note (Signed)
History and Physical Interval Note:  05/31/2014 8:43 AM  Kylie [name]  has presented today for surgery, with the diagnosis of chf  The various methods of treatment have been discussed with the patient and family. After consideration of risks, benefits and other options for treatment, the patient has consented to  Procedure(s): RIGHT HEART CATH (N/A) as a surgical intervention .  The patient's history has been reviewed, patient examined, no change in status, stable for surgery.  I have reviewed the patient's chart and labs.  Questions were answered to the patient's satisfaction.     Dalton Chesapeake EnergyMcLean

## 2014-05-31 NOTE — CV Procedure (Signed)
    Cardiac Catheterization Procedure Note  Name: Kylie [name] MRN: [id number] DOB: 05/08/1960  Procedure: Right Heart Cath  Indication: CHF, ?pulmonary hypertension   Procedural Details: The right brachial area was prepped, draped, and anesthetized with 1% lidocaine. There was a peripheral IV in the right brachial area.  This was replaced with a 5 French venous sheath. A Swan-Ganz catheter was used for the right heart catheterization. Standard protocol was followed for recording of right heart pressures and sampling of oxygen saturations. Fick cardiac output was calculated. There were no immediate procedural complications. The patient was transferred to the post catheterization recovery area for further monitoring.  Procedural Findings: Hemodynamics (mmHg) RA mean 7 RV 33/8 PA 26/15, mean 21 PCWP mean 15  Oxygen saturations: PA 70% AO 97%  Cardiac Output (Fick) 5.95  Cardiac Index (Fick) 2.64   Final Conclusions:  No pulmonary hypertension and filling pressures not significantly elevated.  Would continue current Lasix.  Suspect peripheral edema at this point is present in large part due to venous insufficiency and CCB use.  She has stopped diltiazem.  Continue to use compression stockings.   Kylie [name] 05/31/2014, 9:06 AM

## 2014-05-31 NOTE — Procedures (Signed)
Pt stable post one hour. Dressed and taken via wheelchair to car. Discharge instructions given to Pt and her friend.

## 2014-05-31 NOTE — H&P (View-Only) (Signed)
Patient ID: Kylie [name], female   DOB: 05-03-1960, 54 y.o.   MRN: [id number]  Referring Physician: Dr. Dareen Piano  HPI:  Kylie [name] is a 54 y/o morbidly obese woman with severe HTN, SLE, previous tobacco use (quit 7/15) and fibromyalgia referred by Dr. Dareen Piano for further evaluation of pulmonary HTN on echo.   Says she gained a lot of weight on prednisone for SLE.  She has developed exertional dyspnea and also has had LE edema. Saw PCP and started on lasix.   Used to smoke 1/2 ppd but quit 7/15   At last appt, Lasix increased to 40 mg bid.  She actually has gained 10 lbs.  No dyspnea walking on flat ground, some dyspnea going up steps.  BP is controlled.  She has worsened lower extremity edema.  She is on both amlodipine and diltiazem CD.  Diltiazem was added since my last visit with her.  She thinks that her lower extremity edema worsened after she started diltiazem.  No chest pain.  No orthopnea/PND.    PFTs in 11/15 were normal.   Echo 11/21/13: LVEF 50-55% mild lvh with grade 1 DD. RV normal. Trivial TR. RVSP 32  Labs (11/15): K 5.8 (?hemolyzed) => 5.0, creatinine 0.83 => 0.75, BNP 55 Labs (2/16): K 3.1, creatinine 0.86  Review of Systems: All systems reviewed and negative except as per HPI.   Past Medical History  Diagnosis Date  . Morbid obesity   . Hypertension   . SLE (systemic lupus erythematosus)   . Tobacco use   . Fibromyalgia   . Hearing loss   . Obesity (BMI 30-39.9) 04/14/2014  . Pulmonary HTN 04/14/2014  . Sinus tachycardia 04/14/2014    Current Outpatient Prescriptions  Medication Sig Dispense Refill  . amLODipine (NORVASC) 5 MG tablet TAKE ONE TABLET BY MOUTH ONE TIME DAILY 30 tablet 2  . chlorzoxazone (PARAFON) 500 MG tablet Take 500 mg by mouth 4 (four) times daily as needed for muscle spasms.    . cholecalciferol (VITAMIN D) 1000 UNITS tablet Take 1,000 Units by mouth daily.    . DULoxetine (CYMBALTA) 60 MG capsule Take 60 mg by mouth daily.     . furosemide (LASIX)  40 MG tablet Take 1 tablet (40 mg total) by mouth 2 (two) times daily. 60 tablet 3  . hydroxychloroquine (PLAQUENIL) 200 MG tablet Take 400 mg by mouth daily.     . indomethacin (INDOCIN) 25 MG capsule Take 25 mg by mouth 3 (three) times daily as needed.    Marland Kitchen lisinopril (PRINIVIL,ZESTRIL) 40 MG tablet Take 1 tablet (40 mg total) by mouth daily. 30 tablet 6  . mirtazapine (REMERON) 30 MG tablet Take 15 mg by mouth at bedtime.    . nortriptyline (PAMELOR) 50 MG capsule Take 50 mg by mouth at bedtime.    Marland Kitchen nystatin (MYCOSTATIN) 100000 UNIT/ML suspension Take 5 mLs by mouth 4 (four) times daily.    . predniSONE (DELTASONE) 10 MG tablet Take 10 mg by mouth daily with breakfast.     . tapentadol (NUCYNTA) 50 MG TABS tablet Take 25 mg by mouth every 8 (eight) hours.    . traZODone (DESYREL) 50 MG tablet Take 50 mg by mouth at bedtime.    . carvedilol (COREG) 6.25 MG tablet Take 1 tablet (6.25 mg total) by mouth 2 (two) times daily with a meal. 60 tablet 3  . Fish Oil-Cholecalciferol (FISH OIL + D3 PO) Take by mouth.    . potassium chloride (K-DUR) 10 MEQ tablet  Take 4 tablets (40 mEq total) by mouth daily. 120 tablet 3   No current facility-administered medications for this encounter.    Allergies  Allergen Reactions  . Amoxicillin   . Sulfa Antibiotics       History   Social History  . Marital Status: Legally Separated    Spouse Name: N/A  . Number of Children: 1  . Years of Education: 12   Occupational History  . Disabled    Social History Main Topics  . Smoking status: Former Games developermoker  . Smokeless tobacco: Not on file  . Alcohol Use: No  . Drug Use: No  . Sexual Activity: Not on file   Other Topics Concern  . Not on file   Social History Narrative      Family History  Problem Relation Age of Onset  . Coronary artery disease Father   Mother with SLE  Filed Vitals:   05/23/14 1150  BP: 124/80  Pulse: 96  Weight: 270 lb 8 oz (122.698 kg)  SpO2: 99%   Ambulatory  sats 94-97% with hall walk at 10/15 appt.  PHYSICAL EXAM: General:  Obese female No respiratory difficulty HEENT: normal Neck: Thick, JVP difficult. Carotids 2+ bilat; no bruits. No lymphadenopathy or thryomegaly appreciated.  Cor: PMI nonpalpable  Regular S1S2. No rubs, gallops or murmurs. Lungs: clear Abdomen: obese soft, nontender, nondistended. No hepatosplenomegaly. No bruits or masses. Good bowel sounds. Extremities: no cyanosis, clubbing, rash, 2+ edema to knees bilaterally Neuro: alert & oriented x 3, cranial nerves grossly intact. moves all 4 extremities w/o difficulty. Affect pleasant.  ASSESSMENT & PLAN: 1. Morbid obesity 2. Severe HTN 3. Pulmonary HTN by echo (mild) 4. Probable OSA 5. Chronic diastolic CHF  She does have mild pulmonary HTN by echo but I suspect this is more related to her obesity, diastolic HF, severe HTN and OSA than her lupus. At last appointment, Lasix was increased again.  Diltiazem CD was added but she was already on amlodipine.  Since then, she has worse lower extremity edema.  BP is now controlled.   1) Continue current Lasix.  At this point, I think that we need a right heart cath to sort out her filling pressures and assess PA pressure.  I am reluctant to increase Lasix again. I will arrange for RHC next week.  2) Stop diltiazem CD, continue amlodipine for now.  I will have her use Coreg 6.25 mg bid instead of diltiazem for BP control.  3) Wear knee-high compression stockings.  4) Awaiting sleep study: scheduled for 4/7.  5) Encouraged her to lose weight with low carb diet  Marca AnconaDalton Castiel Lauricella 05/24/2014

## 2014-06-22 ENCOUNTER — Ambulatory Visit (HOSPITAL_BASED_OUTPATIENT_CLINIC_OR_DEPARTMENT_OTHER): Attending: Cardiology | Admitting: *Deleted

## 2014-06-22 VITALS — Ht 65.0 in | Wt 270.0 lb

## 2014-06-22 DIAGNOSIS — I270 Primary pulmonary hypertension: Secondary | ICD-10-CM | POA: Diagnosis not present

## 2014-06-22 DIAGNOSIS — I272 Other secondary pulmonary hypertension: Secondary | ICD-10-CM | POA: Insufficient documentation

## 2014-06-22 DIAGNOSIS — R0683 Snoring: Secondary | ICD-10-CM | POA: Diagnosis not present

## 2014-06-22 DIAGNOSIS — G4733 Obstructive sleep apnea (adult) (pediatric): Secondary | ICD-10-CM | POA: Insufficient documentation

## 2014-06-22 DIAGNOSIS — G4730 Sleep apnea, unspecified: Secondary | ICD-10-CM | POA: Diagnosis present

## 2014-06-29 ENCOUNTER — Telehealth: Payer: Self-pay | Admitting: Cardiology

## 2014-06-29 DIAGNOSIS — G4733 Obstructive sleep apnea (adult) (pediatric): Secondary | ICD-10-CM

## 2014-06-29 NOTE — Sleep Study (Signed)
   NAME: Kylie [name] DATE OF BIRTH:  06/19/1960 MEDICAL RECORD NUMBER [id number]  LOCATION: [facility]  PHYSICIAN: Addilynn Mowrer R  DATE OF STUDY: 06/22/2014  SLEEP STUDY TYPE: Nocturnal Polysomnogram               REFERRING PHYSICIAN: Quintella Reicherturner, Dalonda Simoni R, MD  INDICATION FOR STUDY: pulmonary HTN, loud snoring, witnessed apnea  EPWORTH SLEEPINESS SCORE: 0 HEIGHT: 5\' 5"  (165.1 cm)  WEIGHT: 270 lb (122.471 kg)    Body mass index is 44.93 kg/(m^2).  NECK SIZE: 17 in.  MEDICATIONS: Reviewed in the chart  SLEEP ARCHITECTURE: The patient slept for a total of 237 minutes out of a total sleep period of 278 minutes.  There was 25 minutes of slow wave sleep and 62 minutes of REM sleep.  The onset to sleep latency was prolonged at 125 minutes and the onset to REM sleep latency was 29 minutes.  The sleep efficiency was reduced at 58%.    RESPIRATORY DATA: The patient had a total of 45 apneas, of which, 30 were obstructive and 15 were central.  There were a total of 142 hypopneas.  The overall AHI was 36 events per hour consistent with severe obstructive sleep apnea.  Most events occurred during equally between REM and NREM sleep and in the supine position.  There was mild snoring.    OXYGEN DATA: The average oxygen saturation was 91% and the lowest oxygen saturation was 78%.    CARDIAC DATA: The patient maintained NSR.  The average heart rate was 88 bpm.  The lowest heart rate was 62 bpm.    MOVEMENT/PARASOMNIA: There were no periodic limb movements and no REM sleep behavior disorders.  IMPRESSION/ RECOMMENDATION:   1.  Severe Obstructive Sl Most events occurred during equally between REM and NREM sleep and in the supine position.   2.  Mild snoring was noted. 3.  Reduced sleep efficiency with increased frequency of awakenings due to respiratory events.   4.  Oxygen desaturations as low as 78% associated with respiratory events. 5.  Given the degree of obstructive sleep  apnea/hypopnea syndrome, oxygen desaturations and excessive daytime sleepiness as well as pulmonary HTN, recommend proceeding with CPAP titration.  Signed:  Quintella ReichertURNER,Mikhala Kenan R Diplomate, American Board of Sleep Medicine  ELECTRONICALLY SIGNED ON:  06/29/2014, 10:45 AM [facility] PH: [phone]   FX: [phone] ACCREDITED BY THE AMERICAN ACADEMY OF SLEEP MEDICINE

## 2014-06-29 NOTE — Telephone Encounter (Signed)
Please let patient know that they have sleep apnea and recommend CPAP titration. Please set up titration in the sleep lab. 

## 2014-06-30 NOTE — Addendum Note (Signed)
Addended by: Arcola JanskyOOK, BETHANY M on: 06/30/2014 08:46 AM   Modules accepted: Orders

## 2014-06-30 NOTE — Telephone Encounter (Signed)
Patient is aware of results and is okay to proceed with CPAP Titration. Orders have been put in, and a message sent to scheduling.

## 2014-07-19 ENCOUNTER — Encounter (HOSPITAL_COMMUNITY)

## 2014-07-27 ENCOUNTER — Encounter (HOSPITAL_COMMUNITY)

## 2014-08-21 ENCOUNTER — Ambulatory Visit (HOSPITAL_COMMUNITY)
Admission: RE | Admit: 2014-08-21 | Discharge: 2014-08-21 | Disposition: A | Discharge status: Home / Self Care | Source: Ambulatory Visit | Attending: Internal Medicine | Admitting: Internal Medicine

## 2014-08-21 VITALS — BP 130/84 | HR 89 | Wt 270.1 lb

## 2014-08-21 DIAGNOSIS — I5022 Chronic systolic (congestive) heart failure: Secondary | ICD-10-CM | POA: Diagnosis not present

## 2014-08-21 DIAGNOSIS — I270 Primary pulmonary hypertension: Secondary | ICD-10-CM | POA: Diagnosis not present

## 2014-08-21 DIAGNOSIS — I272 Other secondary pulmonary hypertension: Secondary | ICD-10-CM | POA: Insufficient documentation

## 2014-08-21 DIAGNOSIS — I10 Essential (primary) hypertension: Secondary | ICD-10-CM | POA: Diagnosis not present

## 2014-08-21 DIAGNOSIS — Z79899 Other long term (current) drug therapy: Secondary | ICD-10-CM | POA: Diagnosis not present

## 2014-08-21 DIAGNOSIS — Z7952 Long term (current) use of systemic steroids: Secondary | ICD-10-CM | POA: Diagnosis not present

## 2014-08-21 DIAGNOSIS — I5032 Chronic diastolic (congestive) heart failure: Secondary | ICD-10-CM | POA: Diagnosis not present

## 2014-08-21 DIAGNOSIS — G4733 Obstructive sleep apnea (adult) (pediatric): Secondary | ICD-10-CM | POA: Diagnosis not present

## 2014-08-21 DIAGNOSIS — Z87891 Personal history of nicotine dependence: Secondary | ICD-10-CM | POA: Insufficient documentation

## 2014-08-21 DIAGNOSIS — M797 Fibromyalgia: Secondary | ICD-10-CM | POA: Diagnosis not present

## 2014-08-21 MED ORDER — POTASSIUM CHLORIDE ER 10 MEQ PO TBCR: 20.0000 meq | EXTENDED_RELEASE_TABLET | Freq: Every day | ORAL | Status: DC

## 2014-08-21 MED ORDER — SPIRONOLACTONE 25 MG PO TABS: 12.5000 mg | ORAL_TABLET | Freq: Every day | ORAL | Status: DC

## 2014-08-21 MED ORDER — LISINOPRIL 40 MG PO TABS: 40.0000 mg | ORAL_TABLET | Freq: Every day | ORAL | Status: DC

## 2014-08-21 NOTE — Progress Notes (Signed)
Patient ID: Kylie [name], female   DOB: 1960/11/29, 54 y.o.   MRN: [id number]  Referring Physician: Dr. Dareen Piano  HPI: Kylie [name] is a 54 y/o morbidly obese woman with severe HTN, SLE, previous tobacco use (quit 7/15) and fibromyalgia referred by Dr. Dareen Piano for further evaluation of pulmonary HTN on echo.   Says she gained a lot of weight on prednisone for SLE.  She has developed exertional dyspnea and also has had LE edema. Saw PCP and started on lasix.   Used to smoke 1/2 ppd but quit 7/15   She returns for HF follow up. Last visit diltiazem stopped and she was started on coreg. Had sleep study --> to start CPAP. Has follow up in June. Taking all medications but stopped lisinopril on her own. SOB with steps. Denies PND. Sleeps with HOB up little.  Does not weight at home because she does not have scale. Drinking > 2 liters  Fluid.    PFTs in 11/15 were normal.  RHC 05/2014  RA mean 7 RV 33/8 PA 26/15, mean 21 PCWP mean 15 Oxygen saturations: PA 70% AO 97% Cardiac Output (Fick) 5.95  Cardiac Index (Fick) 2.64   Echo 11/21/13: LVEF 50-55% mild lvh with grade 1 DD. RV normal. Trivial TR. RVSP 32  Labs (11/15): K 5.8 (?hemolyzed) => 5.0, creatinine 0.83 => 0.75, BNP 55 Labs (2/16): K 3.1, creatinine 0.86 Labs (05/29/2014) : K 3.7 Creatinine 0.79   Review of Systems: All systems reviewed and negative except as per HPI.   Past Medical History  Diagnosis Date  . Morbid obesity   . Hypertension   . SLE (systemic lupus erythematosus)   . Tobacco use   . Fibromyalgia   . Hearing loss   . Obesity (BMI 30-39.9) 04/14/2014  . Pulmonary HTN 04/14/2014  . Sinus tachycardia 04/14/2014    Current Outpatient Prescriptions  Medication Sig Dispense Refill  . amLODipine (NORVASC) 5 MG tablet Take 5 mg by mouth daily.    . carvedilol (COREG) 6.25 MG tablet Take 1 tablet (6.25 mg total) by mouth 2 (two) times daily with a meal. 60 tablet 3  . chlorzoxazone (PARAFON) 500 MG tablet Take 500 mg  by mouth 4 (four) times daily as needed for muscle spasms.    . cholecalciferol (VITAMIN D) 1000 UNITS tablet Take 1,000 Units by mouth daily.    . DULoxetine (CYMBALTA) 60 MG capsule Take 60 mg by mouth daily.     . Fish Oil-Cholecalciferol (FISH OIL + D3 PO) Take 1 capsule by mouth daily.     . furosemide (LASIX) 40 MG tablet Take 1 tablet (40 mg total) by mouth 2 (two) times daily. 60 tablet 3  . hydroxychloroquine (PLAQUENIL) 200 MG tablet Take 400 mg by mouth daily.     . indomethacin (INDOCIN) 25 MG capsule Take 25 mg by mouth 3 (three) times daily as needed (for headache).     . mirtazapine (REMERON) 30 MG tablet Take 15 mg by mouth at bedtime.    . nortriptyline (PAMELOR) 50 MG capsule Take 50 mg by mouth at bedtime.    Marland Kitchen nystatin (MYCOSTATIN) 100000 UNIT/ML suspension Take 5 mLs by mouth 4 (four) times daily.    . potassium chloride (K-DUR) 10 MEQ tablet Take 4 tablets (40 mEq total) by mouth daily. 120 tablet 3  . predniSONE (DELTASONE) 10 MG tablet Take 5 mg by mouth daily with breakfast.     . traZODone (DESYREL) 50 MG tablet Take 50 mg by mouth at  bedtime.    . vitamin C (ASCORBIC ACID) 500 MG tablet Take 500 mg by mouth daily.    . vitamin E 400 UNIT capsule Take 400 Units by mouth daily.    Marland Kitchen. lisinopril (PRINIVIL,ZESTRIL) 40 MG tablet Take 1 tablet (40 mg total) by mouth daily. (Patient not taking: Reported on 08/21/2014) 30 tablet 6  . tapentadol (NUCYNTA) 50 MG TABS tablet Take 25 mg by mouth every 8 (eight) hours.     No current facility-administered medications for this encounter.    Allergies  Allergen Reactions  . Amoxicillin Other (See Comments)    Patient doesn't recall  . Sulfa Antibiotics Other (See Comments)    Patient doesn't recall      History   Social History  . Marital Status: Legally Separated    Spouse Name: N/A  . Number of Children: 1  . Years of Education: 12   Occupational History  . Disabled    Social History Main Topics  . Smoking status:  Former Games developermoker  . Smokeless tobacco: Not on file  . Alcohol Use: No  . Drug Use: No  . Sexual Activity: Not on file   Other Topics Concern  . Not on file   Social History Narrative      Family History  Problem Relation Age of Onset  . Coronary artery disease Father   Mother with SLE  Filed Vitals:   08/21/14 1128  BP: 130/84  Pulse: 89  Weight: 270 lb 1.9 oz (122.526 kg)  SpO2: 94%     PHYSICAL EXAM: General:  Obese female No respiratory difficulty HEENT: normal Neck: Thick, JVP difficult. Carotids 2+ bilat; no bruits. No lymphadenopathy or thryomegaly appreciated.  Cor: PMI nonpalpable  Regular S1S2. No rubs, gallops or murmurs. Lungs: clear Abdomen: obese soft, nontender, nondistended. No hepatosplenomegaly. No bruits or masses. Good bowel sounds. Extremities: no cyanosis, clubbing, rash, 2+ edema to knees bilaterally Neuro: alert & oriented x 3, cranial nerves grossly intact. moves all 4 extremities w/o difficulty. Affect pleasant.  ASSESSMENT & PLAN: 1. Morbid obesity- needs to lose weight.  2. Severe HTN - On coreg - Was off lisinopril because she doesn't think it helped. Will restart lisinopril 40 mg daily.  3. Pulmonary HTN by echo (mild)- thought to be related to obesity  Has OSA. To start CPAP.  4. OSA- Had sleep study.  Waiting to start on CPAP.    5. Chronic diastolic CHF Volume status mildly elevated. Continue lasix 40 mg twice a day Add 12.5 mg sprio daily. Check BMET in 10 days. .  Continue ted hose.  Reinforced daily weights, low salt diet, and limiting fluid intake to < 2 liters per day.   Follow up 3 months    CLEGG,AMY 08/21/2014   Patient seen and examined with Tonye BecketAmy Clegg, NP. We discussed all aspects of the encounter. I agree with the assessment and plan as stated above.   Volume status up slightly. Will add spiro 12.5. Reinforced need to watch fluid intake. RHC reviewed and looks ok. Restart lisinopril for HTN. Check BMET 1  week.  Bensimhon, Daniel,MD 1:30 PM

## 2014-08-21 NOTE — Patient Instructions (Signed)
Labs needed in 2 weeks (BMET)  DECREASE Potassium to 20 MeQ daily START Spironolactone 12.5 mg daily RESTART Lisionpril 40 mg daily  Your physician recommends that you schedule a follow-up appointment in: 3 months  Do the following things EVERYDAY: 1) Weigh yourself in the morning before breakfast. Write it down and keep it in a log. 2) Take your medicines as prescribed 3) Eat low salt foods-Limit salt (sodium) to 2000 mg per day.  4) Stay as active as you can everyday 5) Limit all fluids for the day to less than 2 liters 6)

## 2014-08-21 NOTE — Progress Notes (Addendum)
Advanced Heart Failure Medication Review by a Pharmacist  Does the patient  feel that his/her medications are working for him/her?  yes  Has the patient been experiencing any side effects to the medications prescribed?  no  Does the patient measure his/her own blood pressure or blood glucose at home?  no   Does the patient have any problems obtaining medications due to transportation or finances?   no  Understanding of regimen: good Understanding of indications: good Potential of compliance: good    Pharmacist comments: Patient presents to heart failure clinic today and her medications are reviewed with a pharmacist. She brings an updated medication list with her today - diltiazem d/ced after pt had swelling and amlodipine started by Dr. Shirlee LatchMcLean in March 2016. Pt reports that she stopped taking lisinopril as well but does not remember why.  Kylie Ouellet E. Kylie [name], Pharm.D Clinical Pharmacy Resident Pager: [phone] 08/21/2014 11:36 AM

## 2014-09-01 ENCOUNTER — Other Ambulatory Visit (HOSPITAL_COMMUNITY): Payer: Self-pay | Admitting: *Deleted

## 2014-09-01 MED ORDER — AMLODIPINE BESYLATE 5 MG PO TABS: 5.0000 mg | ORAL_TABLET | Freq: Every day | ORAL | Status: DC

## 2014-09-06 ENCOUNTER — Ambulatory Visit (HOSPITAL_COMMUNITY)
Admission: RE | Admit: 2014-09-06 | Discharge: 2014-09-06 | Disposition: A | Discharge status: Home / Self Care | Source: Ambulatory Visit | Attending: Internal Medicine | Admitting: Internal Medicine

## 2014-09-06 DIAGNOSIS — I5032 Chronic diastolic (congestive) heart failure: Secondary | ICD-10-CM | POA: Diagnosis not present

## 2014-09-06 DIAGNOSIS — I5022 Chronic systolic (congestive) heart failure: Secondary | ICD-10-CM | POA: Diagnosis not present

## 2014-09-06 LAB — BASIC METABOLIC PANEL
ANION GAP: 8 (ref 5–15)
BUN: 9 mg/dL (ref 6–20)
CALCIUM: 9.5 mg/dL (ref 8.9–10.3)
CO2: 30 mmol/L (ref 22–32)
CREATININE: 0.97 mg/dL (ref 0.44–1.00)
Chloride: 98 mmol/L — ABNORMAL LOW (ref 101–111)
Glucose, Bld: 89 mg/dL (ref 65–99)
Potassium: 4.2 mmol/L (ref 3.5–5.1)
SODIUM: 136 mmol/L (ref 135–145)

## 2014-09-07 ENCOUNTER — Ambulatory Visit (HOSPITAL_BASED_OUTPATIENT_CLINIC_OR_DEPARTMENT_OTHER): Attending: Cardiology

## 2014-09-07 VITALS — Ht 65.0 in | Wt 270.0 lb

## 2014-09-07 DIAGNOSIS — G4733 Obstructive sleep apnea (adult) (pediatric): Secondary | ICD-10-CM | POA: Diagnosis present

## 2014-09-07 DIAGNOSIS — G4730 Sleep apnea, unspecified: Secondary | ICD-10-CM

## 2014-09-07 DIAGNOSIS — I493 Ventricular premature depolarization: Secondary | ICD-10-CM | POA: Insufficient documentation

## 2014-09-07 DIAGNOSIS — R0683 Snoring: Secondary | ICD-10-CM | POA: Insufficient documentation

## 2014-09-10 ENCOUNTER — Telehealth: Payer: Self-pay | Admitting: Cardiology

## 2014-09-10 NOTE — Telephone Encounter (Signed)
Pt had successful PAP titration. Please setup appointment in 10 weeks. Please let [facility] know that order for PAP is in EPIC.   

## 2014-09-10 NOTE — Sleep Study (Signed)
   NAME: Kylie [name] DATE OF BIRTH:  1960/03/29 MEDICAL RECORD NUMBER [id number]  LOCATION: [facility]  PHYSICIAN: TURNER,TRACI R  DATE OF STUDY: 09/07/2014  SLEEP STUDY TYPE: Positive Airway Pressure Titration               REFERRING PHYSICIAN: Quintella Reichert, MD  INDICATION FOR STUDY: Obstructive sleep apnea  EPWORTH SLEEPINESS SCORE: 0 HEIGHT: 5\' 5"  (165.1 cm)  WEIGHT: 270 lb (122.471 kg)    Body mass index is 44.93 kg/(m^2).  NECK SIZE: 17 in.  MEDICATIONS: Reviewed in the chart  SLEEP ARCHITECTURE: The patient slept for a total of 241 minutes out of a total sleep period time of 270 minutes.  There was 5 minutes of slow wave sleep and 104 minutes of REM sleep.  The onset to sleep latency was prolonged at 100 minutes.  The onset to REM sleep latency was normal at 102 minutes.  The sleep efficiency was reduced at 65%.   RESPIRATORY DATA: CPAP was started at 5cm H2O and titrated for respiratory events and snoring to 12cm H2O.  The patient was able to achieve REM supine sleep at an optimum pressure of 10cm H2O without any further respiratory events.  The AHI was 1.8 events at a pressure of 10cm H2O.  Snoring was eliminated with CPAP.  OXYGEN DATA: The average oxygen saturation was 93%.  The lowest oxygen saturation was 85%.  The time spent with oxygen saturations < 88% was 0.5 minutes.    CARDIAC DATA: The patient maintained NSR with rare PVC's during the study.  The average heart rate was 82 bpm and the lowest heart rate was 42 bpm.   MOVEMENT/PARASOMNIA: There were no periodic limb movements or REM sleep behavior disorders noted.  IMPRESSION/ RECOMMENDATION:   1.  Successful CPAP titration to 10cm H2O. The patient was able to achieve REM supine sleep at an optimum pressure of 10cm H2O without any further respiratory events.  The AHI was 1.8 events at a pressure of 10cm H2O. 2.  Rare PVC's were noted during the study. 3.  Snoring was eliminated with CPAP. 4.   Recommend ResMed CPAP with heated humidity, C flex of 3 and medium [employer] Simplus full face mask with CPAP set at 10cm H2O.     5.  Treatment would also include careful attention to proper sleep hygiene, weight reduction for elevated BMI, avoidance of sleeping in the supine position and avoidance of alcohol within four hours of bedtime.  Specific treatment decisions should be tailored to each patient based upon the clinical situation and all treatment options should be considered.  The patient should be instructed to avoid driving if sleepy and careful clinical follow up is needed to ensure that the patient's symptoms are improving with therapy and the PAP adherence is supported and measured if prescribed.    Signed: Quintella Reichert Diplomate, American Board of Sleep Medicine  ELECTRONICALLY SIGNED ON:  09/10/2014, 6:56 PM [facility] PH: [phone]   FX: [phone] ACCREDITED BY THE AMERICAN ACADEMY OF SLEEP MEDICINE

## 2014-09-10 NOTE — Addendum Note (Signed)
Addended by: Armanda Magic R on: 09/10/2014 07:04 PM   Modules accepted: Orders

## 2014-09-11 NOTE — Telephone Encounter (Signed)
Patient is aware of results. She stated verbal understanding.

## 2014-09-11 NOTE — Telephone Encounter (Addendum)
Left message for patient to call me for full details.  [facility] notified. Once she received her machine I will schedule 10 week followup

## 2014-09-26 ENCOUNTER — Other Ambulatory Visit: Payer: Self-pay | Admitting: Cardiology

## 2014-11-24 ENCOUNTER — Encounter: Payer: Self-pay | Admitting: Cardiology

## 2014-12-27 ENCOUNTER — Other Ambulatory Visit: Payer: Self-pay | Admitting: Internal Medicine

## 2014-12-28 ENCOUNTER — Telehealth: Payer: Self-pay

## 2014-12-29 ENCOUNTER — Other Ambulatory Visit (HOSPITAL_COMMUNITY): Payer: Self-pay | Admitting: *Deleted

## 2015-01-04 ENCOUNTER — Telehealth: Payer: Self-pay | Admitting: Cardiology

## 2015-01-04 ENCOUNTER — Other Ambulatory Visit: Payer: Self-pay | Admitting: Cardiology

## 2015-01-04 NOTE — Telephone Encounter (Signed)
New message    Patient calling has questions regarding cpap machine.

## 2015-01-05 NOTE — Telephone Encounter (Signed)
Patient st she needs a new mask. Informed patient [facility] will be contacted to see why she cannot get new supplies. Patient st she never made an appointment after her sleep study. Scheduled patient 11/23 with [name].  [facility].

## 2015-01-24 ENCOUNTER — Ambulatory Visit
Admission: RE | Admit: 2015-01-24 | Discharge: 2015-01-24 | Disposition: A | Discharge status: Home / Self Care | Payer: Medicare HMO | Source: Ambulatory Visit | Attending: Anesthesiology | Admitting: Anesthesiology

## 2015-01-24 ENCOUNTER — Other Ambulatory Visit: Payer: Self-pay | Admitting: Anesthesiology

## 2015-01-24 DIAGNOSIS — M25551 Pain in right hip: Secondary | ICD-10-CM

## 2015-02-05 ENCOUNTER — Other Ambulatory Visit: Payer: Self-pay | Admitting: *Deleted

## 2015-02-05 NOTE — Telephone Encounter (Signed)
Pt needs amlodipine refill

## 2015-02-07 ENCOUNTER — Encounter: Payer: Self-pay | Admitting: Cardiology

## 2015-02-07 ENCOUNTER — Ambulatory Visit (INDEPENDENT_AMBULATORY_CARE_PROVIDER_SITE_OTHER): Payer: Medicare HMO | Admitting: Cardiology

## 2015-02-07 VITALS — BP 124/70 | HR 81 | Ht 65.0 in | Wt 247.0 lb

## 2015-02-07 DIAGNOSIS — G4733 Obstructive sleep apnea (adult) (pediatric): Secondary | ICD-10-CM | POA: Diagnosis not present

## 2015-02-07 DIAGNOSIS — I10 Essential (primary) hypertension: Secondary | ICD-10-CM | POA: Diagnosis not present

## 2015-02-07 DIAGNOSIS — E669 Obesity, unspecified: Secondary | ICD-10-CM

## 2015-02-07 HISTORY — DX: Obstructive sleep apnea (adult) (pediatric): G47.33

## 2015-02-07 NOTE — Patient Instructions (Signed)

## 2015-02-07 NOTE — Progress Notes (Signed)
Cardiology Office Note   Date:  02/07/2015   ID:  Kylie [name], DOB 07-19-1960, MRN [id number]  PCP:  Salley Slaughter, NP    No chief complaint on file.     History of Present Illness: Kylie [name] is a 54 y.o. female who presents for followup of OSA.  She has chronic diastolic CHF and mild pulnonary HTN by recent echo with PASP felt multifactorial from diastolic HF, HTN, obesity and lupus. Given her morbid obesity there was concern that she may have OSA.  She snores very loudy and will awaken with the snoring. She also complains of very dry mouth and tongue soreness in the am. She feels very unrested in the am. She will also awaken gasping for breath at night and wakes up sometimes in the am with headaches. She has excessive daytime sleepiness and occasionally has to take a nap.  She underwent PSG showing severe OSA with an AHI of 36/hr with oxygen desaturations as low as 78%.  She underwent CPAP titration to 10cm H2O and AHI was 1.8/hr.  She is now here for followup.  She is doing well.  She tolerates her full face mask and feels the pressure is adequate.  Since going on CPAP, she feels more rested in the am and has less daytime sleepiness.  She has no problems with nasal congestion but does have some mouth dryness.She does not get any aerobic exercise.    Past Medical History  Diagnosis Date  . Morbid obesity (HCC)   . Hypertension   . SLE (systemic lupus erythematosus) (HCC)   . Tobacco use   . Fibromyalgia   . Hearing loss   . Obesity (BMI 30-39.9) 04/14/2014  . Pulmonary HTN (HCC) 04/14/2014  . Sinus tachycardia (HCC) 04/14/2014  . OSA (obstructive sleep apnea) 02/07/2015    Severe with AHI 36/hr now on CPAP at 10cm H2O    Past Surgical History  Procedure Laterality Date  . Cholecystectomy    . Cesarean section    . Right heart catheterization N/A 05/31/2014    Procedure: RIGHT HEART CATH;  Surgeon: Laurey Morale, MD;  Location: [facility] CATH LAB;   Service: Cardiovascular;  Laterality: N/A;     Current Outpatient Prescriptions  Medication Sig Dispense Refill  . amLODipine (NORVASC) 5 MG tablet TAKE ONE TABLET BY MOUTH ONE TIME DAILY 30 tablet 0  . carvedilol (COREG) 6.25 MG tablet TAKE 1 TABLET (6.25 MG TOTAL) BY MOUTH 2 (TWO) TIMES DAILY WITH A MEAL  . 60 tablet 1  . chlorzoxazone (PARAFON) 500 MG tablet Take 500 mg by mouth 4 (four) times daily as needed for muscle spasms.    . cholecalciferol (VITAMIN D) 1000 UNITS tablet Take 1,000 Units by mouth daily.    . DULoxetine (CYMBALTA) 60 MG capsule Take 60 mg by mouth daily.     . Fish Oil-Cholecalciferol (FISH OIL + D3 PO) Take 1 capsule by mouth daily.     . furosemide (LASIX) 40 MG tablet TAKE ONE TABLET BY MOUTH TWICE DAILY 60 tablet 1  . hydroxychloroquine (PLAQUENIL) 200 MG tablet Take 400 mg by mouth daily.     . indomethacin (INDOCIN) 25 MG capsule Take 25 mg by mouth 3 (three) times daily as needed (for headache).     . lisinopril (PRINIVIL,ZESTRIL) 40 MG tablet Take 1 tablet (40 mg total) by mouth daily. 30 tablet 6  . mirtazapine (  REMERON) 30 MG tablet Take 15 mg by mouth at bedtime.    . nortriptyline (PAMELOR) 50 MG capsule Take 50 mg by mouth at bedtime.    Marland Kitchen nystatin (MYCOSTATIN) 100000 UNIT/ML suspension Take 5 mLs by mouth 4 (four) times daily.    . potassium chloride (K-DUR) 10 MEQ tablet Take 2 tablets (20 mEq total) by mouth daily. 60 tablet 3  . predniSONE (DELTASONE) 10 MG tablet Take 5 mg by mouth daily with breakfast.     . spironolactone (ALDACTONE) 25 MG tablet Take 0.5 tablets (12.5 mg total) by mouth daily. 15 tablet 3  . tapentadol (NUCYNTA) 50 MG TABS tablet Take 50 mg by mouth every 6 (six) hours.     . traZODone (DESYREL) 50 MG tablet Take 50 mg by mouth at bedtime.    . vitamin C (ASCORBIC ACID) 500 MG tablet Take 500 mg by mouth daily.    . vitamin E 400 UNIT capsule Take 400 Units by mouth daily.     No current facility-administered medications for  this visit.    Allergies:   Amoxicillin and Sulfa antibiotics    Social History:  The patient  reports that she has quit smoking. She does not have any smokeless tobacco history on file. She reports that she does not drink alcohol or use illicit drugs.   Family History:  The patient's family history includes Coronary artery disease in her father.    ROS:  Please see the history of present illness.   Otherwise, review of systems are positive for none.   All other systems are reviewed and negative.    PHYSICAL EXAM: VS:  BP 124/70 mmHg  Pulse 81  Ht  (1.651 m)  Wt 112.038 kg (247 lb)  BMI 41.10 kg/m2  SpO2 98% , BMI Body mass index is 41.1 kg/(m^2). GEN: Well nourished, well developed, in no acute distress HEENT: normal Neck: no JVD, carotid bruits, or masses Cardiac: RRR; no murmurs, rubs, or gallops,no edema  Respiratory:  clear to auscultation bilaterally, normal work of breathing GI: soft, nontender, nondistended, + BS MS: no deformity or atrophy Skin: warm and dry, no rash Neuro:  Strength and sensation are intact Psych: euthymic mood, full affect   EKG:  EKG is not ordered today.    Recent Labs: 05/29/2014: Hemoglobin 13.7; Platelets 234.0 09/06/2014: BUN 9; Creatinine, Ser 0.97; Potassium 4.2; Sodium 136    Lipid Panel No results found for: CHOL, TRIG, HDL, CHOLHDL, VLDL, LDLCALC, LDLDIRECT    Wt Readings from Last 3 Encounters:  02/07/15 112.038 kg (247 lb)  09/07/14 122.471 kg (270 lb)  08/21/14 122.526 kg (270 lb 1.9 oz)       ASSESSMENT AND PLAN:  1.  Severe OSA with an AHI of 36/hr now on CPAP at 10cm H2O.  Her d/l today showed an AHI of 1.4/hr and 73% compliance in using more than 4 hours nightly. Patient has been using and benefiting from CPAP use and will continue to benefit from therapy. I have also instructed the patient on proper sleep hygiene, avoidance of sleeping in the supine position and avoidance of alcohol within 4 hours of bedtime.   The patient was also instructed to avoid driving if sleepy.   2.  Obesity - I have encouraged her to try to get into a routine exercise program along with low fat low carb diet. 3.  HTN - controlled on amlodipine/BB/ACE I   Current medicines are reviewed at length with the patient today.  The patient does not have concerns regarding medicines.  The following changes have been made:  no change  Labs/ tests ordered today: See above Assessment and Plan No orders of the defined types were placed in this encounter.     Disposition:   FU with me in 1 year  Signed, Quintella ReichertURNER,Calob Baskette R, MD  02/07/2015 3:05 PM    [facility] [address], [place], [place]  [address] Phone: [phone]) [phone]; Fax: [phone]

## 2015-02-20 ENCOUNTER — Encounter: Payer: Self-pay | Admitting: Cardiology

## 2015-08-20 ENCOUNTER — Other Ambulatory Visit (HOSPITAL_COMMUNITY): Payer: Self-pay | Admitting: *Deleted

## 2015-08-20 DIAGNOSIS — I5022 Chronic systolic (congestive) heart failure: Secondary | ICD-10-CM

## 2015-08-20 MED ORDER — LISINOPRIL 40 MG PO TABS: 40.0000 mg | ORAL_TABLET | Freq: Every day | ORAL | Status: DC

## 2015-08-30 ENCOUNTER — Other Ambulatory Visit (HOSPITAL_COMMUNITY): Payer: Self-pay | Admitting: *Deleted

## 2015-08-30 ENCOUNTER — Telehealth (HOSPITAL_COMMUNITY): Payer: Self-pay | Admitting: Vascular Surgery

## 2015-08-30 DIAGNOSIS — I5022 Chronic systolic (congestive) heart failure: Secondary | ICD-10-CM

## 2015-08-30 MED ORDER — LISINOPRIL 40 MG PO TABS: 40.0000 mg | ORAL_TABLET | Freq: Every day | ORAL | Status: DC

## 2015-08-30 MED ORDER — LISINOPRIL 40 MG PO TABS
40.0000 mg | ORAL_TABLET | Freq: Every day | ORAL | Status: DC
Start: 2015-08-30 — End: 2015-08-30

## 2015-08-30 NOTE — Telephone Encounter (Signed)
[facility]

## 2015-10-02 ENCOUNTER — Ambulatory Visit (HOSPITAL_COMMUNITY)
Admission: RE | Admit: 2015-10-02 | Discharge: 2015-10-02 | Disposition: A | Discharge status: Home / Self Care | Payer: Medicare HMO | Source: Ambulatory Visit | Attending: Cardiology | Admitting: Cardiology

## 2015-10-02 VITALS — BP 126/80 | HR 95 | Wt 233.8 lb

## 2015-10-02 DIAGNOSIS — R600 Localized edema: Secondary | ICD-10-CM | POA: Diagnosis not present

## 2015-10-02 DIAGNOSIS — R000 Tachycardia, unspecified: Secondary | ICD-10-CM | POA: Diagnosis not present

## 2015-10-02 DIAGNOSIS — G4733 Obstructive sleep apnea (adult) (pediatric): Secondary | ICD-10-CM | POA: Diagnosis not present

## 2015-10-02 DIAGNOSIS — I5032 Chronic diastolic (congestive) heart failure: Secondary | ICD-10-CM | POA: Diagnosis present

## 2015-10-02 DIAGNOSIS — R0602 Shortness of breath: Secondary | ICD-10-CM | POA: Insufficient documentation

## 2015-10-02 DIAGNOSIS — Z79899 Other long term (current) drug therapy: Secondary | ICD-10-CM | POA: Insufficient documentation

## 2015-10-02 DIAGNOSIS — H9190 Unspecified hearing loss, unspecified ear: Secondary | ICD-10-CM | POA: Diagnosis not present

## 2015-10-02 DIAGNOSIS — Z882 Allergy status to sulfonamides status: Secondary | ICD-10-CM | POA: Diagnosis not present

## 2015-10-02 DIAGNOSIS — I272 Other secondary pulmonary hypertension: Secondary | ICD-10-CM | POA: Insufficient documentation

## 2015-10-02 DIAGNOSIS — M797 Fibromyalgia: Secondary | ICD-10-CM | POA: Diagnosis not present

## 2015-10-02 DIAGNOSIS — E669 Obesity, unspecified: Secondary | ICD-10-CM

## 2015-10-02 DIAGNOSIS — I110 Hypertensive heart disease with heart failure: Secondary | ICD-10-CM | POA: Diagnosis not present

## 2015-10-02 DIAGNOSIS — Z880 Allergy status to penicillin: Secondary | ICD-10-CM | POA: Diagnosis not present

## 2015-10-02 DIAGNOSIS — M329 Systemic lupus erythematosus, unspecified: Secondary | ICD-10-CM | POA: Diagnosis not present

## 2015-10-02 DIAGNOSIS — Z8249 Family history of ischemic heart disease and other diseases of the circulatory system: Secondary | ICD-10-CM | POA: Diagnosis not present

## 2015-10-02 DIAGNOSIS — Z87891 Personal history of nicotine dependence: Secondary | ICD-10-CM | POA: Insufficient documentation

## 2015-10-02 DIAGNOSIS — I10 Essential (primary) hypertension: Secondary | ICD-10-CM

## 2015-10-02 DIAGNOSIS — Z6838 Body mass index (BMI) 38.0-38.9, adult: Secondary | ICD-10-CM | POA: Diagnosis not present

## 2015-10-02 LAB — BASIC METABOLIC PANEL
ANION GAP: 6 (ref 5–15)
BUN: 6 mg/dL (ref 6–20)
CHLORIDE: 105 mmol/L (ref 101–111)
CO2: 25 mmol/L (ref 22–32)
Calcium: 9.2 mg/dL (ref 8.9–10.3)
Creatinine, Ser: 0.77 mg/dL (ref 0.44–1.00)
GFR calc Af Amer: >60 mL/min (ref >60)
Glucose, Bld: 105 mg/dL — ABNORMAL HIGH (ref 65–99)
POTASSIUM: 3.6 mmol/L (ref 3.5–5.1)
SODIUM: 136 mmol/L (ref 135–145)

## 2015-10-02 NOTE — Patient Instructions (Signed)
Routine lab work today. Will notify you of abnormal results  Follow up and Echo in 3 months with Dr.Bensimhon

## 2015-10-02 NOTE — Progress Notes (Signed)
Patient ID: Kylie KocherRobin [name], female   DOB: 02/03/1961, 55 y.o.   MRN: [id number]    Advanced Heart Failure Clinic Note   Referring Physician: Dr. Dareen PianoAnderson PCP: Dr Clelia CroftShaw Primary HF/PAH: Dr. Gala RomneyBensimhon   HPI: Kylie BallRobin is a 55 y/o morbidly obese woman with severe HTN, SLE, previous tobacco use (quit 7/15) and fibromyalgia referred by Dr. Dareen PianoAnderson for further evaluation of pulmonary HTN on echo.   Says she gained a lot of weight on prednisone for SLE.  She has developed exertional dyspnea and also has had LE edema. Saw PCP and started on lasix.   She returns today for regular follow up.  Last seen in HF clinic 08/2014. Restarted on lisinopril at last visit, which she had stopped, thinking it "wasn't helping." Has now stopped everything BUT her lisinopril. Back to smoking.  Smoking 1/3 ppd. Now using CPAP nightly, only taking lisinopril and lasix at home. Breathing has been stable.  Gets around a grocery store without SOB, and walks regularly with her friends. Has mild SOB walking up steps to her apartment, but otherwise OK. Weight at home 230 lbs. Drinks more than 2 L.    PFTs in 11/15 were normal.  RHC 05/2014  RA mean 7 RV 33/8 PA 26/15, mean 21 PCWP mean 15 Oxygen saturations: PA 70% AO 97% Cardiac Output (Fick) 5.95  Cardiac Index (Fick) 2.64   Echo 11/21/13: LVEF 50-55% mild lvh with grade 1 DD. RV normal. Trivial TR. RVSP 32  Labs (11/15): K 5.8 (?hemolyzed) => 5.0, creatinine 0.83 => 0.75, BNP 55 Labs (2/16): K 3.1, creatinine 0.86 Labs (05/29/2014) : K 3.7 Creatinine 0.79   Review of Systems: All systems reviewed and negative except as per HPI.   Past Medical History  Diagnosis Date  . Morbid obesity (HCC)   . Hypertension   . SLE (systemic lupus erythematosus) (HCC)   . Tobacco use   . Fibromyalgia   . Hearing loss   . Obesity (BMI 30-39.9) 04/14/2014  . Pulmonary HTN (HCC) 04/14/2014  . Sinus tachycardia (HCC) 04/14/2014  . OSA (obstructive sleep apnea) 02/07/2015    Severe  with AHI 36/hr now on CPAP at 10cm H2O    Current Outpatient Prescriptions  Medication Sig Dispense Refill  . chlorzoxazone (PARAFON) 500 MG tablet Take 500 mg by mouth 4 (four) times daily as needed for muscle spasms.    . furosemide (LASIX) 40 MG tablet TAKE ONE TABLET BY MOUTH TWICE DAILY 60 tablet 1  . hydroxychloroquine (PLAQUENIL) 200 MG tablet Take 400 mg by mouth daily.     Marland Kitchen. lisinopril (PRINIVIL,ZESTRIL) 40 MG tablet Take 1 tablet (40 mg total) by mouth daily. 30 tablet 6  . traZODone (DESYREL) 50 MG tablet Take 50 mg by mouth at bedtime.     No current facility-administered medications for this encounter.    Allergies  Allergen Reactions  . Amoxicillin Other (See Comments)    Patient doesn't recall  . Sulfa Antibiotics Other (See Comments)    Patient doesn't recall      Social History   Social History  . Marital Status: Legally Separated    Spouse Name: N/A  . Number of Children: 1  . Years of Education: 12   Occupational History  . Disabled    Social History Main Topics  . Smoking status: Former Games developermoker  . Smokeless tobacco: Not on file  . Alcohol Use: No  . Drug Use: No  . Sexual Activity: Not on file   Other Topics Concern  .  Not on file   Social History Narrative      Family History  Problem Relation Age of Onset  . Coronary artery disease Father   Mother with SLE  Filed Vitals:   10/02/15 1223  BP: 126/80  Pulse: 95  Weight: 233 lb 12 oz (106.028 kg)  SpO2: 97%   Wt Readings from Last 3 Encounters:  10/02/15 233 lb 12 oz (106.028 kg)  02/07/15 247 lb (112.038 kg)  09/07/14 270 lb (122.471 kg)     PHYSICAL EXAM: General:  Obese female, NAD HEENT: normal Neck: Thick, JVP difficult, does not appear elevated. Carotids 2+ bilat; no bruits. No thyromegaly or nodule noted.  Cor: PMI nonpalpable  Regular S1S2. No M/G/R appreciated. Lungs: CTAB, normal effort.  Abdomen: obese, soft, non-tender, non-distended, no HSM. No bruits or masses.  +BS  Extremities: no cyanosis, clubbing, rash, trace ankle edema at most.  Neuro: alert & oriented x 3, cranial nerves grossly intact. moves all 4 extremities w/o difficulty. Affect pleasant.  ASSESSMENT & PLAN: 1. Morbid obesity - needs to lose weight. She states she has started trying to exercise.  2. HTN - Much improved from previous. - Continue lisinopril 40 mg daily.  3. Pulmonary HTN by echo (mild)- thought to be related to obesity  Has OSA, now on CPAP - Will repeat Echo.  4. OSA-  - She is now on CPAP which seems to have significantly helped her HTN.  - Continue CPAP nightly.    5. Chronic diastolic CHF Volume status stable  - Continue lasix 40 mg twice a day. Can take extra as needed for weight gain 3 lbs overnight or 5 lbs within one week.  - Reinforced daily weights, low salt diet, and limiting fluid intake to < 2 liters per day.   BMET today. Follow up 3 months with Echo (Would do Echo sooner, but lives ~45 minutes away, is symptomatically stable, and wished to combine appointments)  Graciella Freer, PA-C 10/02/2015

## 2015-11-21 ENCOUNTER — Other Ambulatory Visit (HOSPITAL_COMMUNITY): Payer: Self-pay | Admitting: *Deleted

## 2015-11-21 MED ORDER — FUROSEMIDE 40 MG PO TABS: 40.0000 mg | ORAL_TABLET | Freq: Two times a day (BID) | ORAL | 1 refills | Status: DC

## 2015-12-01 ENCOUNTER — Other Ambulatory Visit: Payer: Self-pay | Admitting: Cardiology

## 2016-02-11 ENCOUNTER — Encounter: Payer: Self-pay | Admitting: Cardiology

## 2016-02-18 ENCOUNTER — Ambulatory Visit (INDEPENDENT_AMBULATORY_CARE_PROVIDER_SITE_OTHER): Payer: Medicare HMO | Admitting: Cardiology

## 2016-02-18 ENCOUNTER — Encounter: Payer: Self-pay | Admitting: Cardiology

## 2016-02-18 ENCOUNTER — Encounter (INDEPENDENT_AMBULATORY_CARE_PROVIDER_SITE_OTHER): Payer: Self-pay

## 2016-02-18 VITALS — BP 118/80 | HR 85 | Ht 65.0 in | Wt 237.0 lb

## 2016-02-18 DIAGNOSIS — G4733 Obstructive sleep apnea (adult) (pediatric): Secondary | ICD-10-CM | POA: Diagnosis not present

## 2016-02-18 DIAGNOSIS — I10 Essential (primary) hypertension: Secondary | ICD-10-CM

## 2016-02-18 DIAGNOSIS — E669 Obesity, unspecified: Secondary | ICD-10-CM | POA: Diagnosis not present

## 2016-02-18 NOTE — Addendum Note (Signed)
Addended by: Gunnar FusiKEMP, Yassmine Tamm A on: 02/18/2016 11:19 AM   Modules accepted: Orders

## 2016-02-18 NOTE — Progress Notes (Signed)
Cardiology Office Note    Date:  02/18/2016   ID:  Kylie [name], DOB 02/18/1961, MRN [id number]  PCP:  Salley SlaughterBOONE, ANGELA, NP  Cardiologist:  Nicholes Mangoan Bensimhon, MD   Chief Complaint  Patient presents with  . Sleep Apnea  . Hypertension    History of Present Illness:  Kylie [name] is a 55 y.o. female with a history of chronic diastolic CHF with mild pulnonary HTN by recent echo with PASP 32mmH felt multifactorial from diastolic HF, HTN, obesity and lupus. She also has severe OSA with an AHI of 36/hr with oxygen desaturations as low as 78%.  She is on CPAP at 10cm H2O.  She is now here for followup.  She is doing well.  She tolerates her full face mask and feels the pressure is adequate.  Since going on CPAP, she feels more rested in the am and has less daytime sleepiness.  She has no problems with nasal congestion but does have some mouth dryness.She does not get any aerobic exercise.    Past Medical History:  Diagnosis Date  . Fibromyalgia   . Hearing loss   . Hypertension   . Morbid obesity (HCC)   . Obesity (BMI 30-39.9) 04/14/2014  . OSA (obstructive sleep apnea) 02/07/2015   Severe with AHI 36/hr now on CPAP at 10cm H2O  . Pulmonary HTN 04/14/2014  . Sinus tachycardia 04/14/2014  . SLE (systemic lupus erythematosus) (HCC)   . Tobacco use     Past Surgical History:  Procedure Laterality Date  . CESAREAN SECTION    . CHOLECYSTECTOMY    . RIGHT HEART CATHETERIZATION N/A 05/31/2014   Procedure: RIGHT HEART CATH;  Surgeon: [name], MD;  [facility];  Service: Cardiovascular;  Laterality: N/A;    Current Medications: Outpatient Medications Prior to Visit  Medication Sig Dispense Refill  . chlorzoxazone (PARAFON) 500 MG tablet Take 500 mg by mouth 4 (four) times daily as needed for muscle spasms.    . furosemide (LASIX) 40 MG tablet Take 1 Tablet by mouth 2 times a day 60 tablet 3  . hydroxychloroquine (PLAQUENIL) 200 MG tablet Take 400 mg by mouth daily.     Marland Kitchen.  lisinopril (PRINIVIL,ZESTRIL) 40 MG tablet Take 1 tablet (40 mg total) by mouth daily. 30 tablet 6  . traZODone (DESYREL) 50 MG tablet Take 50 mg by mouth at bedtime.     No facility-administered medications prior to visit.      Allergies:   Amoxicillin and Sulfa antibiotics   Social History   Social History  . Marital status: Legally Separated    Spouse name: N/A  . Number of children: 1  . Years of education: 2912   Occupational History  . Disabled    Social History Main Topics  . Smoking status: Former Games developermoker  . Smokeless tobacco: Never Used  . Alcohol use No  . Drug use: No  . Sexual activity: Not Asked   Other Topics Concern  . None   Social History Narrative  . None     Family History:  The patient's family history includes Coronary artery disease in her father.   ROS:   Please see the history of present illness.    Review of Systems  Musculoskeletal: Positive for back pain and muscle cramps.  Psychiatric/Behavioral: Positive for depression.   All other systems reviewed and are negative.  No flowsheet data found.     PHYSICAL EXAM:   VS:  BP 118/80   Pulse  85   Ht 5\' 5"  (1.651 m)   Wt 237 lb (107.5 kg)   BMI 39.44 kg/m    GEN: Well nourished, well developed, in no acute distress  HEENT: normal  Neck: no JVD, carotid bruits, or masses Cardiac: RRR; no murmurs, rubs, or gallops,no edema.  Intact distal pulses bilaterally.  Respiratory:  clear to auscultation bilaterally, normal work of breathing GI: soft, nontender, nondistended, + BS MS: no deformity or atrophy  Skin: warm and dry, no rash Neuro:  Alert and Oriented x 3, Strength and sensation are intact Psych: euthymic mood, full affect  Wt Readings from Last 3 Encounters:  02/18/16 237 lb (107.5 kg)  10/02/15 233 lb 12 oz (106 kg)  02/07/15 247 lb (112 kg)      Studies/Labs Reviewed:   EKG:  EKG is ordered today and showed NSR at 85bpm with no ST changes  Recent Labs: 10/02/2015: BUN 6;  Creatinine, Ser 0.77; Potassium 3.6; Sodium 136   Lipid Panel No results found for: CHOL, TRIG, HDL, CHOLHDL, VLDL, LDLCALC, LDLDIRECT  Additional studies/ records that were reviewed today include:  none    ASSESSMENT:    1. OSA (obstructive sleep apnea)   2. Essential hypertension   3. Obesity (BMI 30-39.9)      PLAN:  In order of problems listed above:  OSA - the patient is tolerating PAP therapy well without any problems.  The patient has been using and benefiting from CPAP use and will continue to benefit from therapy. I will get a download from her DME 2.  HTN - BP controlled on current meds.  3.  Obesity - I have encouraged him to get into a routine exercise program and cut back on carbs and portions.     Medication Adjustments/Labs and Tests Ordered: Current medicines are reviewed at length with the patient today.  Concerns regarding medicines are outlined above.  Medication changes, Labs and Tests ordered today are listed in the Patient Instructions below.  There are no Patient Instructions on file for this visit.   Signed, Armanda Magicraci Cosette Prindle, MD  02/18/2016 10:46 AM    [facility] [address], [place], [place]  [address] Phone: [phone]; Fax: [phone]

## 2016-02-18 NOTE — Patient Instructions (Signed)

## 2016-02-19 ENCOUNTER — Other Ambulatory Visit: Payer: Self-pay | Admitting: *Deleted

## 2016-03-05 ENCOUNTER — Telehealth: Payer: Self-pay | Admitting: *Deleted

## 2016-03-05 NOTE — Telephone Encounter (Signed)
Spoke to the patient and she has not receive her new mask yet because she needs to pay an outstanding balance on her account. She should have It paid in February and at that time she will call me so I can get a download

## 2016-04-15 ENCOUNTER — Other Ambulatory Visit: Payer: Self-pay | Admitting: Internal Medicine

## 2016-04-15 DIAGNOSIS — I5022 Chronic systolic (congestive) heart failure: Secondary | ICD-10-CM

## 2016-04-23 ENCOUNTER — Encounter: Payer: Self-pay | Admitting: Cardiology

## 2016-04-24 ENCOUNTER — Telehealth: Payer: Self-pay | Admitting: *Deleted

## 2016-04-24 NOTE — Telephone Encounter (Signed)
-----   Message from Reesa Cheworothea G Ellee Wawrzyniak, CMA sent at 03/05/2016  9:31 AM EST ----- Regarding: download in february Goes under Letta KocherRobin Short gets her new mask in February she will call me when she gets her balance paid and gets her new mask

## 2016-04-24 NOTE — Telephone Encounter (Signed)
The patient is still paying on her outstanding balance so she has not received her new mask yet, she will call when she has gotten it paid

## 2016-04-28 ENCOUNTER — Telehealth: Payer: Self-pay | Admitting: *Deleted

## 2016-04-28 NOTE — Telephone Encounter (Signed)
-----   Message from Quintella Reichertraci R Turner, MD sent at 04/25/2016  9:01 PM EST ----- Good AHI on CPAP but needs to improve compliance

## 2016-04-28 NOTE — Telephone Encounter (Signed)
Called and left a message on vm to return my call 

## 2016-04-30 ENCOUNTER — Telehealth: Payer: Self-pay | Admitting: *Deleted

## 2016-04-30 NOTE — Telephone Encounter (Signed)
-----   Message from Quintella Reichertraci R Turner, MD sent at 04/25/2016  9:01 PM EST ----- Good AHI on CPAP but needs to improve compliance

## 2016-04-30 NOTE — Telephone Encounter (Signed)
Called the patient to give her the sleep study results and recommendations, she verbalized understanding and agreed

## 2016-06-09 ENCOUNTER — Encounter: Payer: Self-pay | Admitting: Internal Medicine

## 2016-07-01 ENCOUNTER — Ambulatory Visit (INDEPENDENT_AMBULATORY_CARE_PROVIDER_SITE_OTHER): Payer: Medicare HMO | Admitting: Nurse Practitioner

## 2016-07-01 ENCOUNTER — Other Ambulatory Visit: Payer: Self-pay

## 2016-07-01 ENCOUNTER — Encounter: Payer: Self-pay | Admitting: Nurse Practitioner

## 2016-07-01 DIAGNOSIS — R195 Other fecal abnormalities: Secondary | ICD-10-CM

## 2016-07-01 MED ORDER — PEG 3350-KCL-NA BICARB-NACL 420 G PO SOLR: 4000.0000 mL | ORAL | 0 refills | Status: DC

## 2016-07-01 NOTE — Progress Notes (Signed)
cc'ed to pcp °

## 2016-07-01 NOTE — Assessment & Plan Note (Addendum)
Positive heamasure per primary care. The patient has noted hematochezia intermittently over the past couple few weeks. Denies melena. Has never had a colonoscopy before. Currently due for first-ever colonoscopy. Also due for colonoscopy due to heme positive stool. We'll proceed with colonoscopy as per guidelines.  Proceed with colonoscopy with Dr. Darrick Penna in the near future. The risks, benefits, and alternatives have been discussed in detail with the patient. They state understanding and desire to proceed.   The patient is on trazodone. No other anticoagulants, anxiolytics, chronic pain medications, or antidepressants. We will add 12.5 mg preprocedure Phenergan to promote adequate sedation.

## 2016-07-01 NOTE — Progress Notes (Addendum)
Primary Care Physician:  Kirstie Peri, MD Primary Gastroenterologist:  Dr. Darrick Penna  Chief Complaint  Patient presents with  . Rectal Bleeding    HPI:   Kylie [name] is a 56 y.o. female who presents on referral from primary care for rectal bleeding. Patient was last seen by primary care 05/28/2016 for positive hemasure. No history of colonoscopy found in our system. Patient is a difficult historian. Today she states she has seen a little bit of blood in her stool but not a lot and not a lot. Last time she saw blood was last month. Occasional has some constipation. Denies melena. Is trying to change her dietary intake. Stools occasionally brown and occasionally green. Has never had a colonoscopy before. Denies abdominal pain. Has intermittent "not often" lower abdominal pain. Pain improves with a bowel movement. Denies N/V. Occasionally "burps up food that dioesn't agree with her" more salads then other foods, denes dyspepsia symptoms Denies chest pain, dyspnea, dizziness, lightheadedness, syncope, near syncope. Denies any other upper or lower GI symptoms.  Past Medical History:  Diagnosis Date  . Fibromyalgia   . Hearing loss   . Hypertension   . Morbid obesity (HCC)   . Obesity (BMI 30-39.9) 04/14/2014  . OSA (obstructive sleep apnea) 02/07/2015   Severe with AHI 36/hr now on CPAP at 10cm H2O  . Pulmonary HTN (HCC) 04/14/2014  . Sinus tachycardia 04/14/2014  . SLE (systemic lupus erythematosus) (HCC)   . Tobacco use     Past Surgical History:  Procedure Laterality Date  . CESAREAN SECTION    . CHOLECYSTECTOMY    . RIGHT HEART CATHETERIZATION N/A 05/31/2014   Procedure: RIGHT HEART CATH;  Surgeon: Laurey Morale, MD;  Location: [facility] CATH LAB;  Service: Cardiovascular;  Laterality: N/A;    Current Outpatient Prescriptions  Medication Sig Dispense Refill  . chlorzoxazone (PARAFON) 500 MG tablet Take 500 mg by mouth 4 (four) times daily as needed for muscle spasms.    . furosemide  (LASIX) 40 MG tablet Take 1 Tablet by mouth 2 times a day 60 tablet 3  . hydroxychloroquine (PLAQUENIL) 200 MG tablet Take 400 mg by mouth daily.     Marland Kitchen lisinopril (PRINIVIL,ZESTRIL) 40 MG tablet Take 1 Tablet by mouth once daily 30 tablet 3  . nortriptyline (PAMELOR) 50 MG capsule Take 1 capsule by mouth at bedtime.    . predniSONE (DELTASONE) 10 MG tablet Take 5 mg by mouth daily.  1  . traZODone (DESYREL) 50 MG tablet Take 100 mg by mouth at bedtime.      No current facility-administered medications for this visit.     Allergies as of 07/01/2016 - Review Complete 07/01/2016  Allergen Reaction Noted  . Amoxicillin Other (See Comments) 03/03/2014  . Sulfa antibiotics Other (See Comments) 03/03/2014    Family History  Problem Relation Age of Onset  . Coronary artery disease Father     Social History   Social History  . Marital status: Legally Separated    Spouse name: N/A  . Number of children: 1  . Years of education: 87   Occupational History  . Disabled    Social History Main Topics  . Smoking status: Former Games developer  . Smokeless tobacco: Never Used  . Alcohol use No  . Drug use: No  . Sexual activity: Not on file   Other Topics Concern  . Not on file   Social History Narrative  . No narrative on file    Review of Systems:  General: Negative for anorexia, weight loss, fever, chills, fatigue, weakness. ENT: Negative for hoarseness, difficulty swallowing. CV: Negative for chest pain, angina, palpitations,  peripheral edema.  Respiratory: Negative for dyspnea at rest, cough, sputum, wheezing.  GI: See history of present illness. MS: Negative for joint pain, low back pain.  Derm: Negative for rash or itching.  Endo: Negative for unusual weight change.  Heme: Negative for bruising or bleeding. Allergy: Negative for rash or hives.    Physical Exam: BP 123/85   Pulse 83   Temp 98.3 F (36.8 C) (Oral)   Ht 5' (1.524 m)   Wt 231 lb 3.2 oz (104.9 kg)   BMI  45.15 kg/m  General:   Obese female. Alert and oriented. Pleasant and cooperative. Well-nourished and well-developed.  Head:  Normocephalic and atraumatic. Eyes:  Without icterus, sclera clear and conjunctiva pink.  Ears:  Difficulty hearing Cardiovascular:  S1, S2 present without murmurs appreciated. Normal pulses noted. Extremities without clubbing or edema. Respiratory:  Clear to auscultation bilaterally. No wheezes, rales, or rhonchi. No distress.  Gastrointestinal:  +BS, soft, non-tender and non-distended. No HSM noted. No guarding or rebound. No masses appreciated.  Rectal:  Deferred  Musculoskalatal:  Symmetrical without gross deformities. Neurologic:  Alert and oriented x4;  grossly normal neurologically. Psych:  Alert and cooperative. Normal mood and affect. Heme/Lymph/Immune: No excessive bruising noted.    07/01/2016 8:55 AM   Disclaimer: This note was dictated with voice recognition software. Similar sounding words can inadvertently be transcribed and may not be corrected upon review.

## 2016-07-01 NOTE — Patient Instructions (Signed)
1. We will schedule your procedure for you. 2. Return for follow-up based on postprocedure recommendations. 3. Call us if you have any questions.

## 2016-07-01 NOTE — Patient Instructions (Signed)
Called [employer] for PA for TCS. No PA needed. Ref# [id number].

## 2016-07-09 ENCOUNTER — Ambulatory Visit (HOSPITAL_COMMUNITY)
Admission: RE | Admit: 2016-07-09 | Discharge: 2016-07-09 | Disposition: A | Discharge status: Home / Self Care | Payer: Medicare HMO | Source: Ambulatory Visit | Attending: Gastroenterology | Admitting: Gastroenterology

## 2016-07-09 ENCOUNTER — Encounter (HOSPITAL_COMMUNITY)
Admission: RE | Disposition: A | Discharge status: Home / Self Care | Payer: Self-pay | Source: Ambulatory Visit | Attending: Gastroenterology

## 2016-07-09 ENCOUNTER — Encounter (HOSPITAL_COMMUNITY): Payer: Self-pay | Admitting: *Deleted

## 2016-07-09 DIAGNOSIS — Z881 Allergy status to other antibiotic agents status: Secondary | ICD-10-CM | POA: Insufficient documentation

## 2016-07-09 DIAGNOSIS — K648 Other hemorrhoids: Secondary | ICD-10-CM | POA: Insufficient documentation

## 2016-07-09 DIAGNOSIS — M329 Systemic lupus erythematosus, unspecified: Secondary | ICD-10-CM | POA: Diagnosis not present

## 2016-07-09 DIAGNOSIS — I10 Essential (primary) hypertension: Secondary | ICD-10-CM | POA: Diagnosis not present

## 2016-07-09 DIAGNOSIS — R195 Other fecal abnormalities: Secondary | ICD-10-CM

## 2016-07-09 DIAGNOSIS — K621 Rectal polyp: Secondary | ICD-10-CM | POA: Insufficient documentation

## 2016-07-09 DIAGNOSIS — Z8249 Family history of ischemic heart disease and other diseases of the circulatory system: Secondary | ICD-10-CM | POA: Insufficient documentation

## 2016-07-09 DIAGNOSIS — Z9889 Other specified postprocedural states: Secondary | ICD-10-CM | POA: Insufficient documentation

## 2016-07-09 DIAGNOSIS — G4733 Obstructive sleep apnea (adult) (pediatric): Secondary | ICD-10-CM | POA: Insufficient documentation

## 2016-07-09 DIAGNOSIS — K625 Hemorrhage of anus and rectum: Secondary | ICD-10-CM | POA: Insufficient documentation

## 2016-07-09 DIAGNOSIS — K635 Polyp of colon: Secondary | ICD-10-CM | POA: Diagnosis not present

## 2016-07-09 DIAGNOSIS — Z882 Allergy status to sulfonamides status: Secondary | ICD-10-CM | POA: Diagnosis not present

## 2016-07-09 DIAGNOSIS — Z6841 Body Mass Index (BMI) 40.0 and over, adult: Secondary | ICD-10-CM | POA: Diagnosis not present

## 2016-07-09 DIAGNOSIS — F17210 Nicotine dependence, cigarettes, uncomplicated: Secondary | ICD-10-CM | POA: Diagnosis not present

## 2016-07-09 DIAGNOSIS — R000 Tachycardia, unspecified: Secondary | ICD-10-CM | POA: Diagnosis not present

## 2016-07-09 DIAGNOSIS — Z9049 Acquired absence of other specified parts of digestive tract: Secondary | ICD-10-CM | POA: Diagnosis not present

## 2016-07-09 DIAGNOSIS — M797 Fibromyalgia: Secondary | ICD-10-CM | POA: Diagnosis not present

## 2016-07-09 DIAGNOSIS — Q438 Other specified congenital malformations of intestine: Secondary | ICD-10-CM | POA: Insufficient documentation

## 2016-07-09 DIAGNOSIS — I272 Pulmonary hypertension, unspecified: Secondary | ICD-10-CM | POA: Insufficient documentation

## 2016-07-09 HISTORY — PX: COLONOSCOPY: SHX5424

## 2016-07-09 SURGERY — COLONOSCOPY
Anesthesia: Moderate Sedation

## 2016-07-09 MED ORDER — SODIUM CHLORIDE 0.9 % IV SOLN
INTRAVENOUS | Status: DC
  Administered 2016-07-09: 08:00:00 via INTRAVENOUS

## 2016-07-09 MED ORDER — MIDAZOLAM HCL 5 MG/5ML IJ SOLN
INTRAMUSCULAR | Status: AC
  Filled 2016-07-09: qty 10

## 2016-07-09 MED ORDER — HYDROCORTISONE 1 % RE CREA: TOPICAL_CREAM | RECTAL | 0 refills | Status: DC

## 2016-07-09 MED ORDER — MEPERIDINE HCL 100 MG/ML IJ SOLN
INTRAMUSCULAR | Status: AC
  Filled 2016-07-09: qty 2

## 2016-07-09 MED ORDER — SODIUM CHLORIDE 0.9% FLUSH
INTRAVENOUS | Status: AC
  Filled 2016-07-09: qty 10

## 2016-07-09 MED ORDER — PROMETHAZINE HCL 25 MG/ML IJ SOLN
12.5000 mg | Freq: Once | INTRAMUSCULAR | Status: AC
  Administered 2016-07-09: 12.5 mg via INTRAVENOUS

## 2016-07-09 MED ORDER — MEPERIDINE HCL 100 MG/ML IJ SOLN
INTRAMUSCULAR | Status: DC | PRN
  Administered 2016-07-09 (×3): 25 mg via INTRAVENOUS

## 2016-07-09 MED ORDER — SIMETHICONE 40 MG/0.6ML PO SUSP
ORAL | Status: AC
  Filled 2016-07-09: qty 30

## 2016-07-09 MED ORDER — PROMETHAZINE HCL 25 MG/ML IJ SOLN
INTRAMUSCULAR | Status: AC
  Filled 2016-07-09: qty 1

## 2016-07-09 MED ORDER — MIDAZOLAM HCL 5 MG/5ML IJ SOLN
INTRAMUSCULAR | Status: DC | PRN
Start: 2016-07-09 — End: 2016-07-09
  Administered 2016-07-09 (×3): 2 mg via INTRAVENOUS

## 2016-07-09 NOTE — Procedures (Signed)
REFERRING PROVIDER: [facility]  PROCEDURE: COLONOSCOPY WITH COLD FORCEPS POLYPECTOMY  INDICATION: RECTAL BLEEDING  FINDINGS: 1. NORMAL ILEUM 2. NORMAL COLON 3. TWO 2-3 MM SESSILE RECTAL POLYPS REMOVED VIA COLD FORCEPS. 4. REDUNDANT LEFT COLON  RECOMMENDATIONS: CONTINUE YOUR WEIGHT LOSS EFFORTS. LOSE TEN POUNDS. DRINK WATER TO KEEP YOUR URINE LIGHT YELLOW. FOLLOW A HIGH FIBER DIET. AVOID ITEMS THAT CAUSE BLOATING & GAS.  USE PREPARATION H OR PROCTO-PAK FOUR TIMES  A DAY IF NEEDED TO RELIEVE RECTAL PAIN/PRESSURE/BLEEDING. BIOPSY RESULTS WILL BE AVAILABLE IN MY CHART AFTER APR 27 AND MY OFFICE WILL CONTACT YOU IN 10-14 DAYS WITH YOUR RESULTS.  Next colonoscopy in 5-10 years.    PROCEDURE TECHNIQUE: PHYSICAL EXAM WAS PERFORMED AND CONSENT OBTAINED. SCOPE ADVANCED TO THE ILEUM WITH THE ASSISTANCE OF COLOWRAP.  COLD FORCEPS POLYPECTOMY PERFORMED. THE SCOPE WAS REMOVED BY CAREFULLY EXAMINING THE COLOR, TEXTURE, AND ANATOMY OF THE MUCOSA.  PT WAS RECOVERED AND DISCHARGED HOME IN SATISFACTORY CONDITION.

## 2016-07-09 NOTE — H&P (Signed)
Primary Care Physician:  Kirstie Peri, MD Primary Gastroenterologist:  Dr. Darrick Penna  Pre-Procedure History & Physical: HPI:  Kylie [name] is a 56 y.o. female here for  BRBPR.  Past Medical History:  Diagnosis Date  . Fibromyalgia   . Hearing loss   . Hypertension   . Morbid obesity (HCC)   . Obesity (BMI 30-39.9) 04/14/2014  . OSA (obstructive sleep apnea) 02/07/2015   Severe with AHI 36/hr now on CPAP at 10cm H2O  . Pulmonary HTN (HCC) 04/14/2014  . Sinus tachycardia 04/14/2014  . SLE (systemic lupus erythematosus) (HCC)   . Tobacco use     Past Surgical History:  Procedure Laterality Date  . CESAREAN SECTION    . CHOLECYSTECTOMY    . RIGHT HEART CATHETERIZATION N/A 05/31/2014   Procedure: RIGHT HEART CATH;  Surgeon: Laurey Morale, MD;  Location: [facility] CATH LAB;  Service: Cardiovascular;  Laterality: N/A;    Prior to Admission medications   Medication Sig Start Date End Date Taking? Authorizing Provider  chlorzoxazone (PARAFON) 500 MG tablet Take 500 mg by mouth 4 (four) times daily as needed for muscle spasms.   Yes Historical Provider, MD  furosemide (LASIX) 40 MG tablet Take 1 Tablet by mouth 2 times a day 12/04/15  Yes Laurey Morale, MD  hydroxychloroquine (PLAQUENIL) 200 MG tablet Take 600 mg by mouth daily.    Yes Historical Provider, MD  lisinopril (PRINIVIL,ZESTRIL) 40 MG tablet Take 1 Tablet by mouth once daily 04/15/16  Yes Dolores Patty, MD  nortriptyline (PAMELOR) 50 MG capsule Take 1 capsule by mouth at bedtime. 01/21/16  Yes Historical Provider, MD  predniSONE (DELTASONE) 10 MG tablet Take 5 mg by mouth daily. 01/10/16  Yes Historical Provider, MD  traZODone (DESYREL) 50 MG tablet Take 100 mg by mouth at bedtime.    Yes Historical Provider, MD    Allergies as of 07/01/2016 - Review Complete 07/01/2016  Allergen Reaction Noted  . Amoxicillin Other (See Comments) 03/03/2014  . Sulfa antibiotics Other (See Comments) 03/03/2014    Family History  Problem  Relation Age of Onset  . Coronary artery disease Father     Social History   Social History  . Marital status: Legally Separated    Spouse name: N/A  . Number of children: 1  . Years of education: 48   Occupational History  . Disabled    Social History Main Topics  . Smoking status: Current Every Day Smoker    Packs/day: 0.25    Years: 5.00    Types: Cigarettes  . Smokeless tobacco: Never Used  . Alcohol use No  . Drug use: No  . Sexual activity: Not on file   Other Topics Concern  . Not on file   Social History Narrative  . No narrative on file    Review of Systems: See HPI, otherwise negative ROS   Physical Exam: BP 114/73   Pulse 80   Temp 98.2 F (36.8 C) (Oral)   Resp 15   Ht 5' (1.524 m)   Wt 231 lb (104.8 kg)   SpO2 100%   BMI 45.11 kg/m  General:   Alert,  pleasant and cooperative in NAD Head:  Normocephalic and atraumatic. Neck:  Supple; Lungs:  Clear throughout to auscultation.    Heart:  Regular rate and rhythm. Abdomen:  Soft, nontender and nondistended. Normal bowel sounds, without guarding, and without rebound.   Neurologic:  Alert and  oriented x4;  grossly normal neurologically.  Impression/Plan:  BRBPR  PLAN: TCS TODAY. DISCUSSED PROCEDURE, BENEFITS, & RISKS: < 1% chance of medication reaction, bleeding, perforation, or rupture of spleen/liver.

## 2016-07-09 NOTE — Discharge Instructions (Signed)
You had 2 polyps removed. You have internal hemorrhoids, WHICH ARE THE CAUSE FOR YOUR RECTAL BLEEDING.    CONTINUE YOUR WEIGHT LOSS EFFORTS. LOSE TEN POUNDS.  DRINK WATER TO KEEP YOUR URINE LIGHT YELLOW.  FOLLOW A HIGH FIBER DIET. AVOID ITEMS THAT CAUSE BLOATING & GAS. SEE INFO BELOW.  USE PREPARATION H OR PROCTO-PAK FOUR TIMES  A DAY IF NEEDED TO RELIEVE RECTAL PAIN/PRESSURE/BLEEDING.  YOUR BIOPSY RESULTS WILL BE AVAILABLE IN MY CHART AFTER APR 27 AND MY OFFICE WILL CONTACT YOU IN 10-14 DAYS WITH YOUR RESULTS.   Next colonoscopy in 5-10 years.    Colonoscopy Care After Read the instructions outlined below and refer to this sheet in the next week. These discharge instructions provide you with general information on caring for yourself after you leave the hospital. While your treatment has been planned according to the most current medical practices available, unavoidable complications occasionally occur. If you have any problems or questions after discharge, call DR. Kacper Cartlidge, [phone].  ACTIVITY  You may resume your regular activity, but move at a slower pace for the next 24 hours.   Take frequent rest periods for the next 24 hours.   Walking will help get rid of the air and reduce the bloated feeling in your belly (abdomen).   No driving for 24 hours (because of the medicine (anesthesia) used during the test).   You may shower.   Do not sign any important legal documents or operate any machinery for 24 hours (because of the anesthesia used during the test).    NUTRITION  Drink plenty of fluids.   You may resume your normal diet as instructed by your doctor.   Begin with a light meal and progress to your normal diet. Heavy or fried foods are harder to digest and may make you feel sick to your stomach (nauseated).   Avoid alcoholic beverages for 24 hours or as instructed.    MEDICATIONS  You may resume your normal medications.   WHAT YOU CAN EXPECT TODAY  Some  feelings of bloating in the abdomen.   Passage of more gas than usual.   Spotting of blood in your stool or on the toilet paper  .  IF YOU HAD POLYPS REMOVED DURING THE COLONOSCOPY:  Eat a soft diet IF YOU HAVE NAUSEA, BLOATING, ABDOMINAL PAIN, OR VOMITING.    FINDING OUT THE RESULTS OF YOUR TEST Not all test results are available during your visit. DR. Darrick Penna WILL CALL YOU WITHIN 14 DAYS OF YOUR PROCEDUE WITH YOUR RESULTS. Do not assume everything is normal if you have not heard from DR. Kyree Fedorko, CALL HER OFFICE AT [phone].  SEEK IMMEDIATE MEDICAL ATTENTION AND CALL THE OFFICE: [phone] IF:  You have more than a spotting of blood in your stool.   Your belly is swollen (abdominal distention).   You are nauseated or vomiting.   You have a temperature over 101F.   You have abdominal pain or discomfort that is severe or gets worse throughout the day.   High-Fiber Diet A high-fiber diet changes your normal diet to include more whole grains, legumes, fruits, and vegetables. Changes in the diet involve replacing refined carbohydrates with unrefined foods. The calorie level of the diet is essentially unchanged. The Dietary Reference Intake (recommended amount) for adult males is 38 grams per day. For adult females, it is 25 grams per day. Pregnant and lactating women should consume 28 grams of fiber per day. Fiber is the intact part of a plant  that is not broken down during digestion. Functional fiber is fiber that has been isolated from the plant to provide a beneficial effect in the body. PURPOSE  Increase stool bulk.   Ease and regulate bowel movements.   Lower cholesterol.   REDUCE RISK OF COLON CANCER  INDICATIONS THAT YOU NEED MORE FIBER  Constipation and hemorrhoids.   Uncomplicated diverticulosis (intestine condition) and irritable bowel syndrome.   Weight management.   As a protective measure against hardening of the arteries (atherosclerosis), diabetes, and  cancer.   GUIDELINES FOR INCREASING FIBER IN THE DIET  Start adding fiber to the diet slowly. A gradual increase of about 5 more grams (2 slices of whole-wheat bread, 2 servings of most fruits or vegetables, or 1 bowl of high-fiber cereal) per day is best. Too rapid an increase in fiber may result in constipation, flatulence, and bloating.   Drink enough water and fluids to keep your urine clear or pale yellow. Water, juice, or caffeine-free drinks are recommended. Not drinking enough fluid may cause constipation.   Eat a variety of high-fiber foods rather than one type of fiber.   Try to increase your intake of fiber through using high-fiber foods rather than fiber pills or supplements that contain small amounts of fiber.   The goal is to change the types of food eaten. Do not supplement your present diet with high-fiber foods, but replace foods in your present diet.   INCLUDE A VARIETY OF FIBER SOURCES  Replace refined and processed grains with whole grains, canned fruits with fresh fruits, and incorporate other fiber sources. White rice, white breads, and most bakery goods contain little or no fiber.   Brown whole-grain rice, buckwheat oats, and many fruits and vegetables are all good sources of fiber. These include: broccoli, Brussels sprouts, cabbage, cauliflower, beets, sweet potatoes, white potatoes (skin on), carrots, tomatoes, eggplant, squash, berries, fresh fruits, and dried fruits.   Cereals appear to be the richest source of fiber. Cereal fiber is found in whole grains and bran. Bran is the fiber-rich outer coat of cereal grain, which is largely removed in refining. In whole-grain cereals, the bran remains. In breakfast cereals, the largest amount of fiber is found in those with "bran" in their names. The fiber content is sometimes indicated on the label.   You may need to include additional fruits and vegetables each day.   In baking, for 1 cup white flour, you may use the  following substitutions:   1 cup whole-wheat flour minus 2 tablespoons.   1/2 cup white flour plus 1/2 cup whole-wheat flour.    Hemorrhoids Hemorrhoids are dilated (enlarged) veins around the rectum. Sometimes clots will form in the veins. This makes them swollen and painful. These are called thrombosed hemorrhoids. Causes of hemorrhoids include:  Constipation.   Straining to have a bowel movement.   HEAVY LIFTING  HOME CARE INSTRUCTIONS  Eat a well balanced diet and drink 6 to 8 glasses of water every day to avoid constipation. You may also use a bulk laxative.   Avoid straining to have bowel movements.   Keep anal area dry and clean.   Do not use a donut shaped pillow or sit on the toilet for long periods. This increases blood pooling and pain.   Move your bowels when your body has the urge; this will require less straining and will decrease pain and pressure.

## 2016-07-11 ENCOUNTER — Encounter (HOSPITAL_COMMUNITY): Payer: Self-pay | Admitting: Gastroenterology

## 2016-07-26 ENCOUNTER — Telehealth: Payer: Self-pay | Admitting: Gastroenterology

## 2016-07-26 NOTE — Telephone Encounter (Signed)
Please call pt. She had HYPERPLASTIC POLYPS removed.   CONTINUE YOUR WEIGHT LOSS EFFORTS. LOSE TEN POUNDS. DRINK WATER TO KEEP YOUR URINE LIGHT YELLOW. FOLLOW A HIGH FIBER DIET. AVOID ITEMS THAT CAUSE BLOATING & GAS. USE PREPARATION H OR PROCTO-PAK FOUR TIMES  A DAY IF NEEDED TO RELIEVE RECTAL PAIN/PRESSURE/BLEEDING.  Next colonoscopy in 10 years NOT 5.

## 2016-07-28 NOTE — Telephone Encounter (Signed)
Reminder in epic °

## 2016-07-28 NOTE — Telephone Encounter (Signed)
PT is aware.

## 2016-08-19 ENCOUNTER — Other Ambulatory Visit: Payer: Self-pay | Admitting: Internal Medicine

## 2016-08-19 DIAGNOSIS — I5022 Chronic systolic (congestive) heart failure: Secondary | ICD-10-CM

## 2016-09-16 ENCOUNTER — Other Ambulatory Visit: Payer: Self-pay | Admitting: Cardiology

## 2016-10-23 ENCOUNTER — Telehealth (HOSPITAL_COMMUNITY): Payer: Self-pay

## 2016-10-23 NOTE — Telephone Encounter (Signed)
Pt stopped her coreg on her own decision prior to the 10/02/15 appointment according to the notes.   It was not restarted at that appointment.  She would need an appointment in the next few weeks prior to restarting as we have not assessed her in over a year.    Casimiro NeedleMichael [address]Andy" Savageillery, PA-C 10/23/2016 9:14 AM

## 2016-10-23 NOTE — Telephone Encounter (Signed)
Returned call to patient, left VM clarifying per our records she has been off this medication and medication has not been filled by our office in over a year.  Advised to return call to clinic to clarify whether or not she has been taking this medication or having it filled through another office.  Reminded of upcoming apt with Dr. Gala RomneyBensimhon 10/8 and advised provider will determine whether she should restart that medication at that time.  Ave FilterBradley, Megan Genevea, RN

## 2016-10-23 NOTE — Telephone Encounter (Signed)
Received call from [facility] stating patient called to request new Rx refill on coreg 6.25 mg twice daily.  Pharmacy states this is a new request and is not on their file.  Advised epic charting system was down at that time and unable to verify at that time. Patients chart does list coreg 6.25 mg twice daily on 08/21/14 notes up to the 10/02/15 note where it is not listed.  Cannot find where the medication may have been DC'd (no notes or lab results documenting DC in this med). Will speak with CHF clinical pharmD Elizabeth PalauErika Nicolsen and Otilio SaberAndy Tillery PA-C who last saw patient to see whether patient should remain on medication or not. Patient's next f/u apt 12/22/16.  Ave FilterBradley, Megan Genevea, RN

## 2016-11-10 NOTE — Telephone Encounter (Signed)
Patient called today stating she is still paying on her outstanding balance at her DME.  Patient states she she hopes to have the balance paid by her December office visit because she really needs her cpap.

## 2016-12-22 ENCOUNTER — Encounter (HOSPITAL_COMMUNITY): Payer: Self-pay | Admitting: Internal Medicine

## 2016-12-22 ENCOUNTER — Ambulatory Visit (HOSPITAL_COMMUNITY)
Admission: RE | Admit: 2016-12-22 | Discharge: 2016-12-22 | Disposition: A | Discharge status: Home / Self Care | Payer: Medicare HMO | Source: Ambulatory Visit | Attending: Internal Medicine | Admitting: Internal Medicine

## 2016-12-22 VITALS — BP 146/88 | HR 78 | Wt 239.8 lb

## 2016-12-22 DIAGNOSIS — E669 Obesity, unspecified: Secondary | ICD-10-CM

## 2016-12-22 DIAGNOSIS — G4733 Obstructive sleep apnea (adult) (pediatric): Secondary | ICD-10-CM | POA: Insufficient documentation

## 2016-12-22 DIAGNOSIS — Z880 Allergy status to penicillin: Secondary | ICD-10-CM | POA: Insufficient documentation

## 2016-12-22 DIAGNOSIS — I110 Hypertensive heart disease with heart failure: Secondary | ICD-10-CM | POA: Insufficient documentation

## 2016-12-22 DIAGNOSIS — I10 Essential (primary) hypertension: Secondary | ICD-10-CM | POA: Diagnosis not present

## 2016-12-22 DIAGNOSIS — Z79899 Other long term (current) drug therapy: Secondary | ICD-10-CM | POA: Insufficient documentation

## 2016-12-22 DIAGNOSIS — Z6841 Body Mass Index (BMI) 40.0 and over, adult: Secondary | ICD-10-CM | POA: Diagnosis not present

## 2016-12-22 DIAGNOSIS — I5032 Chronic diastolic (congestive) heart failure: Secondary | ICD-10-CM | POA: Diagnosis not present

## 2016-12-22 DIAGNOSIS — I272 Pulmonary hypertension, unspecified: Secondary | ICD-10-CM

## 2016-12-22 DIAGNOSIS — F17210 Nicotine dependence, cigarettes, uncomplicated: Secondary | ICD-10-CM | POA: Diagnosis not present

## 2016-12-22 MED ORDER — SPIRONOLACTONE 25 MG PO TABS: 12.5000 mg | ORAL_TABLET | Freq: Every day | ORAL | 3 refills | Status: DC

## 2016-12-22 NOTE — Addendum Note (Signed)
Encounter addended by: Georgina Peer, RN on: 12/22/2016 11:01 AM<BR>    Actions taken: Diagnosis association updated, Pharmacy for encounter modified, Order list changed, Sign clinical note

## 2016-12-22 NOTE — Patient Instructions (Signed)
START taking Spironolactone 12.5 mg (0.5 Tablet) Once Daily  Labs in 1 week  Echocardiogram has been ordered for you, we will schedule at checkout.  Pulmonary Function Test has been ordered for you, we will schedule at checkout.   Follow up in 1 year.

## 2016-12-22 NOTE — Progress Notes (Signed)
Patient ID: Kylie [name], female   DOB: December 14, 1960, 56 y.o.   MRN: [id number]    Advanced Heart Failure Clinic Note   Referring Physician: Dr. Dareen Piano PCP: Dr Clelia Croft Primary HF/PAH: Dr. Gala Romney   HPI: Kylie [name] is a 56 y/o morbidly obese woman with severe HTN, SLE, previous tobacco use (quit 7/15) and fibromyalgia referred by Dr. Dareen Piano for further evaluation of pulmonary HTN on echo.   Says she gained a lot of weight on prednisone for SLE.  She has developed exertional dyspnea and also has had LE edema. Saw PCP and started on lasix.   She returns today for follow-up. We have not seen her in over a year. Doing ok. Can go to grocery store without much problems. Does not like to lie flat sleeps with several pillows. Lost weight but regained it back. No edema or PND. Still smoking 2-3 cigs/day. Using CPAP every night.    PFTs in 11/15 were normal.  RHC 05/2014  RA mean 7 RV 33/8 PA 26/15, mean 21 PCWP mean 15 Oxygen saturations: PA 70% AO 97% Cardiac Output (Fick) 5.95  Cardiac Index (Fick) 2.64   Echo 11/21/13: LVEF 50-55% mild lvh with grade 1 DD. RV normal. Trivial TR. RVSP 32  Labs (11/15): K 5.8 (?hemolyzed) => 5.0, creatinine 0.83 => 0.75, BNP 55 Labs (2/16): K 3.1, creatinine 0.86 Labs (05/29/2014) : K 3.7 Creatinine 0.79   Review of Systems: All systems reviewed and negative except as per HPI.   Past Medical History:  Diagnosis Date  . Fibromyalgia   . Hearing loss   . Hypertension   . Morbid obesity (HCC)   . Obesity (BMI 30-39.9) 04/14/2014  . OSA (obstructive sleep apnea) 02/07/2015   Severe with AHI 36/hr now on CPAP at 10cm H2O  . Pulmonary HTN (HCC) 04/14/2014  . Sinus tachycardia 04/14/2014  . SLE (systemic lupus erythematosus) (HCC)   . Tobacco use     Current Outpatient Prescriptions  Medication Sig Dispense Refill  . chlorzoxazone (PARAFON) 500 MG tablet Take 500 mg by mouth 4 (four) times daily as needed for muscle spasms.    Marland Kitchen doxycycline (DORYX)  100 MG EC tablet Take 100 mg by mouth 2 (two) times daily.    . furosemide (LASIX) 40 MG tablet Take 1 Tablet by mouth 2 times a day 60 tablet 3  . hydrocortisone (PROCTO-PAK) 1 % CREA APPLY TO HEMORRHOIDS QID FR 10 DAYS 30 g 0  . hydroxychloroquine (PLAQUENIL) 200 MG tablet Take 600 mg by mouth daily.     Marland Kitchen lisinopril (PRINIVIL,ZESTRIL) 40 MG tablet Take 1 tablet (40 mg total) by mouth daily. Needs office visit 30 tablet 3  . PARoxetine (PAXIL) 40 MG tablet Take 40 mg by mouth every morning.    . predniSONE (DELTASONE) 10 MG tablet Take 5 mg by mouth daily.  1  . traZODone (DESYREL) 50 MG tablet Take 100 mg by mouth at bedtime.      No current facility-administered medications for this encounter.     Allergies  Allergen Reactions  . Amoxicillin Other (See Comments)    Patient doesn't recall  . Sulfa Antibiotics Other (See Comments)    Patient doesn't recall      Social History   Social History  . Marital status: Legally Separated    Spouse name: N/A  . Number of children: 1  . Years of education: 65   Occupational History  . Disabled    Social History Main Topics  . Smoking status:  Current Every Day Smoker    Packs/day: 0.25    Years: 5.00    Types: Cigarettes  . Smokeless tobacco: Never Used  . Alcohol use No  . Drug use: No  . Sexual activity: Not on file   Other Topics Concern  . Not on file   Social History Narrative  . No narrative on file      Family History  Problem Relation Age of Onset  . Coronary artery disease Father   Mother with SLE  Vitals:   12/22/16 1008  BP: (!) 146/88  Pulse: 78  SpO2: 98%  Weight: 239 lb 12.8 oz (108.8 kg)   Wt Readings from Last 3 Encounters:  12/22/16 239 lb 12.8 oz (108.8 kg)  07/09/16 231 lb (104.8 kg)  07/01/16 231 lb 3.2 oz (104.9 kg)     PHYSICAL EXAM: General:  Well appearing. No resp difficulty. Speech impediment due to hearing loss HEENT: normal Neck: supple. no JVD. Carotids 2+ bilat; no bruits. No  lymphadenopathy or thryomegaly appreciated. Cor: PMI nondisplaced. Regular rate & rhythm. No rubs, gallops or murmurs. Lungs: clear Abdomen: obese soft, nontender, nondistended. No hepatosplenomegaly. No bruits or masses. Good bowel sounds. Extremities: no cyanosis, clubbing, rash, edema Neuro: alert & orientedx3, cranial nerves grossly intact. moves all 4 extremities w/o difficulty. Affect pleasant  ECG: NSR 66 No ST-T wave abnormalities.    ASSESSMENT & PLAN: 1. Morbid obesity - Reinforced need for weight loss. Discussed low-carb diet 2. HTN -  BP slightly elevated. Start spiro 12.5 daily. BMET 1 week  - F/u PCP 3. Pulmonary HTN by echo (mild)- thought to be related to obesity  Has OSA, now on CPAP - Will repeat Echo and PFTs 4. OSA-  - She is now on CPAP which seems to have significantly helped her HTN.  - Continue CPAP nightly.    5. Chronic diastolic CHF - Volume status looks good - Continue lasix 40 mg twice a day. Can take extra as needed for weight gain 3 lbs overnight or 5 lbs within one week.  - Reinforced daily weights, low salt diet, and limiting fluid intake to < 2 liters per day.  6. Tobacco use - Reinforced need to quit  Arvilla Meres, MD 12/22/2016

## 2016-12-29 ENCOUNTER — Ambulatory Visit (HOSPITAL_COMMUNITY)
Admission: RE | Admit: 2016-12-29 | Discharge: 2016-12-29 | Disposition: A | Discharge status: Home / Self Care | Payer: Medicare HMO | Source: Ambulatory Visit | Attending: Cardiology | Admitting: Cardiology

## 2016-12-29 DIAGNOSIS — I5032 Chronic diastolic (congestive) heart failure: Secondary | ICD-10-CM | POA: Diagnosis present

## 2016-12-29 LAB — BASIC METABOLIC PANEL
Anion gap: 8 (ref 5–15)
BUN: 17 mg/dL (ref 6–20)
CHLORIDE: 99 mmol/L — AB (ref 101–111)
CO2: 29 mmol/L (ref 22–32)
Calcium: 9.2 mg/dL (ref 8.9–10.3)
Creatinine, Ser: 0.98 mg/dL (ref 0.44–1.00)
GFR calc Af Amer: >60 mL/min (ref >60)
GFR calc non Af Amer: >60 mL/min (ref >60)
GLUCOSE: 81 mg/dL (ref 65–99)
POTASSIUM: 4.3 mmol/L (ref 3.5–5.1)
Sodium: 136 mmol/L (ref 135–145)

## 2017-01-12 ENCOUNTER — Ambulatory Visit (HOSPITAL_COMMUNITY)
Admission: RE | Admit: 2017-01-12 | Discharge: 2017-01-12 | Disposition: A | Discharge status: Home / Self Care | Payer: Medicare HMO | Source: Ambulatory Visit | Attending: Internal Medicine | Admitting: Internal Medicine

## 2017-01-12 DIAGNOSIS — I5032 Chronic diastolic (congestive) heart failure: Secondary | ICD-10-CM

## 2017-01-12 LAB — PULMONARY FUNCTION TEST
DL/VA % pred: 111 %
DL/VA: 5.49 ml/min/mmHg/L
DLCO UNC % PRED: 86 %
DLCO unc: 22.24 ml/min/mmHg
FEF 25-75 PRE: 2.57 L/s
FEF 25-75 Post: 3.21 L/sec
FEF2575-%CHANGE-POST: 25 %
FEF2575-%Pred-Post: 126 %
FEF2575-%Pred-Pre: 100 %
FEV1-%Change-Post: 5 %
FEV1-%PRED-POST: 90 %
FEV1-%PRED-PRE: 85 %
FEV1-POST: 2.47 L
FEV1-Pre: 2.35 L
FEV1FVC-%CHANGE-POST: 0 %
FEV1FVC-%Pred-Pre: 105 %
FEV6-%CHANGE-POST: 4 %
FEV6-%PRED-POST: 86 %
FEV6-%PRED-PRE: 82 %
FEV6-PRE: 2.81 L
FEV6-Post: 2.95 L
FEV6FVC-%Change-Post: 0 %
FEV6FVC-%PRED-PRE: 103 %
FEV6FVC-%Pred-Post: 102 %
FVC-%CHANGE-POST: 5 %
FVC-%Pred-Post: 84 %
FVC-%Pred-Pre: 80 %
FVC-Post: 2.95 L
FVC-Pre: 2.81 L
POST FEV6/FVC RATIO: 100 %
Post FEV1/FVC ratio: 84 %
Pre FEV1/FVC ratio: 84 %
Pre FEV6/FVC Ratio: 100 %
RV % PRED: 109 %
RV: 2.15 L
TLC % pred: 93 %
TLC: 4.87 L

## 2017-01-12 MED ORDER — ALBUTEROL SULFATE (2.5 MG/3ML) 0.083% IN NEBU
2.5000 mg | INHALATION_SOLUTION | Freq: Once | RESPIRATORY_TRACT | Status: AC
  Administered 2017-01-12: 2.5 mg via RESPIRATORY_TRACT

## 2017-01-12 NOTE — Progress Notes (Signed)
  Echocardiogram 2D Echocardiogram has been performed.  Kaiser Belluomini L Androw 01/12/2017, 11:55 AM

## 2017-01-15 ENCOUNTER — Other Ambulatory Visit: Payer: Self-pay | Admitting: Cardiology

## 2017-01-15 DIAGNOSIS — I5022 Chronic systolic (congestive) heart failure: Secondary | ICD-10-CM

## 2017-01-30 ENCOUNTER — Encounter: Payer: Self-pay | Admitting: Cardiology

## 2017-02-04 ENCOUNTER — Telehealth (HOSPITAL_COMMUNITY): Payer: Self-pay | Admitting: *Deleted

## 2017-02-04 NOTE — Telephone Encounter (Signed)
Received medical record request from [facility].  Requested records faxed today to [phone].  Original request will be scanned to patient's electronic medical record.

## 2017-02-26 ENCOUNTER — Ambulatory Visit: Payer: Medicare HMO | Admitting: Cardiology

## 2017-04-06 ENCOUNTER — Other Ambulatory Visit: Payer: Self-pay | Admitting: Cardiology

## 2017-05-01 ENCOUNTER — Encounter (INDEPENDENT_AMBULATORY_CARE_PROVIDER_SITE_OTHER): Payer: Self-pay

## 2017-05-01 ENCOUNTER — Encounter: Payer: Self-pay | Admitting: Cardiology

## 2017-05-01 ENCOUNTER — Ambulatory Visit (INDEPENDENT_AMBULATORY_CARE_PROVIDER_SITE_OTHER): Payer: Medicare HMO | Admitting: Cardiology

## 2017-05-01 VITALS — BP 100/60 | HR 85 | Wt 245.2 lb

## 2017-05-01 DIAGNOSIS — E669 Obesity, unspecified: Secondary | ICD-10-CM

## 2017-05-01 DIAGNOSIS — G4733 Obstructive sleep apnea (adult) (pediatric): Secondary | ICD-10-CM

## 2017-05-01 DIAGNOSIS — I10 Essential (primary) hypertension: Secondary | ICD-10-CM

## 2017-05-01 NOTE — Progress Notes (Signed)
Cardiology Office Note:    Date:  05/01/2017   ID:  Kylie GuestRobin Lazarus, DOB 05/14/1960, MRN [id number]  PCP:  [name], [name], MD  Cardiologist:  No primary care provider on file.    Referring MD: [name], [name], MD   Chief Complaint  Patient presents with  . Sleep Apnea  . Hypertension    History of Present Illness:    Kylie [name] is a 57 y.o. female with a hx of chronic diastolic CHF with mild pulnonary HTN by recent echo with PASP 32mmH felt multifactorial from diastolic HF, HTN, obesity and lupus. She also has severe OSA with an AHI of 36/hr with oxygen desaturations as low as 78%. She is on CPAP at 10cm H2O.  She is doing well with her CPAP device.  She tolerates the full face mask and feels the pressure is adequate.  Since going on CPAP she feels rested in the am and has no significant daytime sleepiness.  She denies any significant mouth or nasal dryness or nasal congestion.  She thinks that he snores some even with the CPAP.     Past Medical History:  Diagnosis Date  . Fibromyalgia   . Hearing loss   . Hypertension   . Morbid obesity (HCC)   . Obesity (BMI 30-39.9) 04/14/2014  . OSA (obstructive sleep apnea) 02/07/2015   Severe with AHI 36/hr now on CPAP at 10cm H2O  . Pulmonary HTN (HCC) 04/14/2014  . Sinus tachycardia 04/14/2014  . SLE (systemic lupus erythematosus) (HCC)   . Tobacco use     Past Surgical History:  Procedure Laterality Date  . CESAREAN SECTION    . CHOLECYSTECTOMY    . COLONOSCOPY N/A 07/09/2016   Procedure: COLONOSCOPY;  Surgeon: [name], MD;  Location: [facility];  Service: Endoscopy;  Laterality: N/A;  9:00am  . RIGHT HEART CATHETERIZATION N/A 05/31/2014   Procedure: RIGHT HEART CATH;  Surgeon: [name], MD;  [facility];  Service: Cardiovascular;  Laterality: N/A;    Current Medications: Current Meds  Medication Sig  . chlorzoxazone (PARAFON) 500 MG tablet Take 500 mg by mouth 4 (four) times daily as needed for muscle spasms.    . furosemide (LASIX) 40 MG tablet TAKE  (1)  TABLET TWICE A DAY.  . hydroxychloroquine (PLAQUENIL) 200 MG tablet Take 600 mg by mouth daily.   Marland Kitchen. lisinopril (PRINIVIL,ZESTRIL) 40 MG tablet Take 1 Tablet by mouth once daily  . PARoxetine (PAXIL) 40 MG tablet Take 40 mg by mouth every morning.  . predniSONE (DELTASONE) 10 MG tablet Take 5 mg by mouth daily.  Marland Kitchen. spironolactone (ALDACTONE) 25 MG tablet Take 0.5 tablets (12.5 mg total) by mouth daily.  . traZODone (DESYREL) 50 MG tablet Take 100 mg by mouth at bedtime.      Allergies:   Amoxicillin and Sulfa antibiotics   Social History   Socioeconomic History  . Marital status: Legally Separated    Spouse name: None  . Number of children: 1  . Years of education: 5912  . Highest education level: None  Social Needs  . Financial resource strain: None  . Food insecurity - worry: None  . Food insecurity - inability: None  . Transportation needs - medical: None  . Transportation needs - non-medical: None  Occupational History  . Occupation: Disabled  Tobacco Use  . Smoking status: Current Every Day Smoker    Packs/day: 0.25    Years: 5.00    Pack years: 1.25  Types: Cigarettes  . Smokeless tobacco: Never Used  Substance and Sexual Activity  . Alcohol use: No  . Drug use: No  . Sexual activity: None  Other Topics Concern  . None  Social History Narrative  . None     Family History: The patient's family history includes Coronary artery disease in her father.  ROS:   Please see the history of present illness.    Review of Systems  Musculoskeletal: Positive for muscle cramps.  Neurological: Positive for headaches.  Psychiatric/Behavioral: Positive for depression.    All other systems reviewed and negative.   EKGs/Labs/Other Studies Reviewed:    The following studies were reviewed today: CPAP download  EKG:  EKG is not ordered today.   Recent Labs: 12/29/2016: BUN 17; Creatinine, Ser 0.98; Potassium 4.3; Sodium 136    Recent Lipid Panel No results found for: CHOL, TRIG, HDL, CHOLHDL, VLDL, LDLCALC, LDLDIRECT  Physical Exam:    VS:  BP 100/60   Pulse 85   Wt 245 lb 3.2 oz (111.2 kg)   SpO2 97%   BMI 47.89 kg/m     Wt Readings from Last 3 Encounters:  05/01/17 245 lb 3.2 oz (111.2 kg)  12/22/16 239 lb 12.8 oz (108.8 kg)  07/09/16 231 lb (104.8 kg)     GEN:  Well nourished, well developed in no acute distress HEENT: Normal NECK: No JVD; No carotid bruits LYMPHATICS: No lymphadenopathy CARDIAC: RRR, no murmurs, rubs, gallops RESPIRATORY:  Clear to auscultation without rales, wheezing or rhonchi  ABDOMEN: Soft, non-tender, non-distended MUSCULOSKELETAL:  No edema; No deformity  SKIN: Warm and dry NEUROLOGIC:  Alert and oriented x 3 PSYCHIATRIC:  Normal affect   ASSESSMENT:    1. OSA (obstructive sleep apnea)   2. Essential hypertension   3. Obesity (BMI 30-39.9)    PLAN:    In order of problems listed above:  1.  OSA - the patient is tolerating PAP therapy well without any problems. The PAP download was reviewed today and showed an AHI of 1.5/hr on 10 cm H2O with 97% compliance in using more than 4 hours nightly.  The patient has been using and benefiting from PAP use and will continue to benefit from therapy.   2.  HTN - BP is well controlled on exam today. She will continue on Lisinopril 40mg  daily, spironolactone 12.5mg  daily  3.  Obesity - She has gained 14lbs in the past year.  I have encouraged she to get into a routine exercise program and cut back on carbs and portions.    Medication Adjustments/Labs and Tests Ordered: Current medicines are reviewed at length with the patient today.  Concerns regarding medicines are outlined above.  No orders of the defined types were placed in this encounter.  No orders of the defined types were placed in this encounter.   Signed, Armanda Magic, MD  05/01/2017 9:14 AM    [facility]

## 2017-05-01 NOTE — Patient Instructions (Signed)
Medication Instructions:  Your physician recommends that you continue on your current medications as directed. Please refer to the Current Medication list given to you today.  If you need a refill on your cardiac medications, please contact your pharmacy first.  Labwork: None ordered   Testing/Procedures: None ordered   Follow-Up: Your physician wants you to follow-up in: 1 year with Dr. Turner. You will receive a reminder letter in the mail two months in advance. If you don't receive a letter, please call our office to schedule the follow-up appointment.  Any Other Special Instructions Will Be Listed Below (If Applicable).   Thank you for choosing [facility]    Rena Libbey Duce, RN  [phone]  If you need a refill on your cardiac medications before your next appointment, please call your pharmacy.   

## 2017-07-23 ENCOUNTER — Other Ambulatory Visit (HOSPITAL_COMMUNITY): Payer: Self-pay | Admitting: *Deleted

## 2017-07-23 DIAGNOSIS — I5022 Chronic systolic (congestive) heart failure: Secondary | ICD-10-CM

## 2017-07-23 MED ORDER — LISINOPRIL 40 MG PO TABS: 40.0000 mg | ORAL_TABLET | Freq: Every day | ORAL | 3 refills | Status: DC

## 2017-08-13 ENCOUNTER — Other Ambulatory Visit: Payer: Self-pay | Admitting: Internal Medicine

## 2017-08-17 NOTE — Telephone Encounter (Signed)
Patient called today to say she has paid off her bad debt and she will call [facility] to get her new mask. Patient will call when she gets her mask.

## 2017-09-09 ENCOUNTER — Telehealth (HOSPITAL_COMMUNITY): Payer: Self-pay | Admitting: Internal Medicine

## 2017-09-09 NOTE — Telephone Encounter (Signed)
09/09/17 11:43am Patient returned called very hesitant about scheduling an appt - patient asked if was free because she has a lot going on - pt wanted to know the cost -informed her to call her insurance company for the coverage/cost. Patient stated that she will her insurance company and gives us a call back./sh

## 2017-11-24 ENCOUNTER — Other Ambulatory Visit (HOSPITAL_COMMUNITY): Payer: Self-pay | Admitting: Family Medicine

## 2017-11-24 ENCOUNTER — Ambulatory Visit (HOSPITAL_COMMUNITY)
Admission: RE | Admit: 2017-11-24 | Discharge: 2017-11-24 | Disposition: A | Discharge status: Home / Self Care | Payer: Medicare Other | Source: Ambulatory Visit | Attending: Internal Medicine | Admitting: Internal Medicine

## 2017-11-24 ENCOUNTER — Other Ambulatory Visit (HOSPITAL_COMMUNITY): Payer: Self-pay | Admitting: Internal Medicine

## 2017-11-24 DIAGNOSIS — R600 Localized edema: Secondary | ICD-10-CM | POA: Diagnosis present

## 2017-11-24 DIAGNOSIS — M79609 Pain in unspecified limb: Secondary | ICD-10-CM | POA: Insufficient documentation

## 2017-11-25 ENCOUNTER — Ambulatory Visit: Payer: Medicare Other | Admitting: Podiatry

## 2017-12-21 ENCOUNTER — Ambulatory Visit: Payer: Medicare Other | Admitting: Podiatry

## 2017-12-21 ENCOUNTER — Other Ambulatory Visit: Payer: Self-pay | Admitting: Podiatry

## 2017-12-21 ENCOUNTER — Encounter: Payer: Self-pay | Admitting: Podiatry

## 2017-12-21 ENCOUNTER — Ambulatory Visit (INDEPENDENT_AMBULATORY_CARE_PROVIDER_SITE_OTHER): Payer: Medicare Other

## 2017-12-21 VITALS — BP 126/87 | HR 102 | Resp 16

## 2017-12-21 DIAGNOSIS — M8580 Other specified disorders of bone density and structure, unspecified site: Secondary | ICD-10-CM | POA: Insufficient documentation

## 2017-12-21 DIAGNOSIS — Z789 Other specified health status: Secondary | ICD-10-CM

## 2017-12-21 DIAGNOSIS — IMO0002 Reserved for concepts with insufficient information to code with codable children: Secondary | ICD-10-CM | POA: Insufficient documentation

## 2017-12-21 DIAGNOSIS — R5383 Other fatigue: Secondary | ICD-10-CM | POA: Insufficient documentation

## 2017-12-21 DIAGNOSIS — M722 Plantar fascial fibromatosis: Secondary | ICD-10-CM

## 2017-12-21 DIAGNOSIS — F32A Depression, unspecified: Secondary | ICD-10-CM | POA: Insufficient documentation

## 2017-12-21 DIAGNOSIS — M2550 Pain in unspecified joint: Secondary | ICD-10-CM | POA: Insufficient documentation

## 2017-12-21 DIAGNOSIS — R030 Elevated blood-pressure reading, without diagnosis of hypertension: Secondary | ICD-10-CM

## 2017-12-21 DIAGNOSIS — Z461 Encounter for fitting and adjustment of hearing aid: Secondary | ICD-10-CM | POA: Insufficient documentation

## 2017-12-21 DIAGNOSIS — M79672 Pain in left foot: Principal | ICD-10-CM

## 2017-12-21 DIAGNOSIS — M542 Cervicalgia: Secondary | ICD-10-CM | POA: Insufficient documentation

## 2017-12-21 DIAGNOSIS — R600 Localized edema: Secondary | ICD-10-CM

## 2017-12-21 DIAGNOSIS — F419 Anxiety disorder, unspecified: Secondary | ICD-10-CM

## 2017-12-21 DIAGNOSIS — J0100 Acute maxillary sinusitis, unspecified: Secondary | ICD-10-CM | POA: Insufficient documentation

## 2017-12-21 DIAGNOSIS — E2839 Other primary ovarian failure: Secondary | ICD-10-CM | POA: Insufficient documentation

## 2017-12-21 DIAGNOSIS — M3500 Sicca syndrome, unspecified: Secondary | ICD-10-CM | POA: Insufficient documentation

## 2017-12-21 DIAGNOSIS — B029 Zoster without complications: Secondary | ICD-10-CM | POA: Insufficient documentation

## 2017-12-21 DIAGNOSIS — R110 Nausea: Secondary | ICD-10-CM | POA: Insufficient documentation

## 2017-12-21 DIAGNOSIS — M79671 Pain in right foot: Secondary | ICD-10-CM

## 2017-12-21 DIAGNOSIS — L0291 Cutaneous abscess, unspecified: Secondary | ICD-10-CM | POA: Insufficient documentation

## 2017-12-21 DIAGNOSIS — G4700 Insomnia, unspecified: Secondary | ICD-10-CM | POA: Insufficient documentation

## 2017-12-21 DIAGNOSIS — K921 Melena: Secondary | ICD-10-CM | POA: Insufficient documentation

## 2017-12-21 DIAGNOSIS — B370 Candidal stomatitis: Secondary | ICD-10-CM | POA: Insufficient documentation

## 2017-12-21 DIAGNOSIS — F329 Major depressive disorder, single episode, unspecified: Secondary | ICD-10-CM | POA: Insufficient documentation

## 2017-12-21 DIAGNOSIS — L02419 Cutaneous abscess of limb, unspecified: Secondary | ICD-10-CM | POA: Insufficient documentation

## 2017-12-21 DIAGNOSIS — N611 Abscess of the breast and nipple: Secondary | ICD-10-CM | POA: Insufficient documentation

## 2017-12-21 DIAGNOSIS — M549 Dorsalgia, unspecified: Secondary | ICD-10-CM | POA: Insufficient documentation

## 2017-12-21 DIAGNOSIS — J069 Acute upper respiratory infection, unspecified: Secondary | ICD-10-CM | POA: Insufficient documentation

## 2017-12-21 DIAGNOSIS — N946 Dysmenorrhea, unspecified: Secondary | ICD-10-CM | POA: Insufficient documentation

## 2017-12-21 MED ORDER — TRIAMCINOLONE ACETONIDE 10 MG/ML IJ SUSP
10.0000 mg | Freq: Once | INTRAMUSCULAR | Status: AC
  Administered 2017-12-21: 10 mg

## 2017-12-21 NOTE — Patient Instructions (Signed)

## 2017-12-21 NOTE — Progress Notes (Signed)
   Subjective:    Patient ID: Kylie [name], female    DOB: August 20, 1960, 57 y.o.   MRN: [id number]  HPI    Review of Systems  All other systems reviewed and are negative.      Objective:   Physical Exam        Assessment & Plan:

## 2017-12-22 NOTE — Progress Notes (Signed)
Subjective:   Patient ID: Kylie [name], female   DOB: 57 y.o.   MRN: [id number]   HPI Patient states she is having about a 46-month history of pain in her left heel and she is trying to be active and exercise and is preventing her from doing this she has some low-grade chronic swelling in her right ankle that she wanted checked and states that at times it can be tender.  Patient does not smoke likes to be active   Review of Systems  All other systems reviewed and are negative.       Objective:  Physical Exam  Constitutional: She appears well-developed and well-nourished.  Cardiovascular: Intact distal pulses.  Pulmonary/Chest: Effort normal.  Musculoskeletal: Normal range of motion.  Neurological: She is alert.  Skin: Skin is warm.  Nursing note and vitals reviewed.   Neurovascular status intact muscle strength is adequate range of motion within normal limits with patient noted to have exquisite tenderness plantar aspect left heel at the insertional point of the tendon into the calcaneus with inflammation and fluid around the medial band and on the right ankle there is some mild edema in the side of the ankle +1 pitting with negative Homans sign or pain in the back of the heel or calf muscle.  Patient states is been like this for fairly long time and she does have compressive hose which he does not wear them frequently     Assessment:  Acute plantar fasciitis left with mild swelling right which may be related to his disease or may be related to stress on the ankle     Plan:  H&P x-rays reviewed and today I injected the left plantar fascia 3 mg Kenalog 5 mg Xylocaine and I did apply a ankle compression stocking right which she stated felt good.  She has seen a vascular doctor and had her veins checked in the past and may need to do this again in the future if the swelling got worse and I did instruct her on what to do any form of swelling in the back of the leg for redness or other  pathology were to occur.  I did go ahead and for the left I also dispensed fascial brace to lift up the arch  X-ray indicated spur formation bilateral with moderate depression of the arch and did not indicate currently any arthritis of the ankle joint

## 2018-01-04 ENCOUNTER — Ambulatory Visit: Payer: Medicare Other | Admitting: Podiatry

## 2018-01-18 ENCOUNTER — Encounter: Payer: Self-pay | Admitting: Podiatry

## 2018-01-18 ENCOUNTER — Ambulatory Visit: Payer: Medicare Other | Admitting: Podiatry

## 2018-01-18 DIAGNOSIS — M722 Plantar fascial fibromatosis: Secondary | ICD-10-CM | POA: Diagnosis not present

## 2018-01-18 MED ORDER — TRIAMCINOLONE ACETONIDE 10 MG/ML IJ SUSP
10.0000 mg | Freq: Once | INTRAMUSCULAR | Status: AC
  Administered 2018-01-18: 10 mg

## 2018-01-19 NOTE — Progress Notes (Signed)
Subjective:   Patient ID: Kylie [name], female   DOB: 57 y.o.   MRN: [id number]   HPI Patient presents stating that her heel has been extremely sore again and that she needs to work but it is hard to work due to the pain   ROS      Objective:  Physical Exam  Neurovascular status intact with intense discomfort plantar aspect left heel the insertion point tendon calcaneus     Assessment:  Acute plantar fasciitis left with inflammation fluid buildup     Plan:  Discussed continued physical therapy anti-inflammatories and dispensed her fracture walker to completely immobilize.  Injected the plantar fascial 3 mg Kenalog 5 mg Xylocaine and instructed on reduced activity.  Reappoint to recheck again in 1 month or earlier if needed

## 2018-02-17 ENCOUNTER — Encounter: Payer: Self-pay | Admitting: Podiatry

## 2018-02-17 ENCOUNTER — Ambulatory Visit (INDEPENDENT_AMBULATORY_CARE_PROVIDER_SITE_OTHER): Payer: Medicare Other | Admitting: Podiatry

## 2018-02-17 DIAGNOSIS — M722 Plantar fascial fibromatosis: Secondary | ICD-10-CM | POA: Diagnosis not present

## 2018-02-17 DIAGNOSIS — R600 Localized edema: Secondary | ICD-10-CM

## 2018-02-17 NOTE — Progress Notes (Signed)
Subjective:   Patient ID: Kylie [name], female   DOB: 57 y.o.   MRN: [id number]   HPI Patient states improving but still having some discomfort with mild swelling and discomfort with walking   ROS      Objective:  Physical Exam  Neurovascular status intact negative Homans sign noted with reduced discomfort plantar aspect left heel with boot in place which is helping her a lot and reduced swelling     Assessment:  Seems to be improving with continued boot usage for immobilization along with compression     Plan:  Discussed the importance of continued compression elevation at times and boot usage with gradual reduction over the next 3 to 4 weeks.  Patient will be seen back if symptoms were to persist or get worse

## 2018-03-02 ENCOUNTER — Other Ambulatory Visit: Payer: Self-pay | Admitting: Internal Medicine

## 2018-05-18 ENCOUNTER — Other Ambulatory Visit (HOSPITAL_COMMUNITY): Payer: Self-pay | Admitting: Internal Medicine

## 2018-05-19 ENCOUNTER — Ambulatory Visit: Payer: Medicare Other | Admitting: Cardiology

## 2018-06-21 ENCOUNTER — Telehealth (HOSPITAL_COMMUNITY): Payer: Self-pay | Admitting: Vascular Surgery

## 2018-06-21 NOTE — Telephone Encounter (Signed)
Left pt message to move office visit to a tele visit the week of 4/20-4/24

## 2018-06-28 ENCOUNTER — Ambulatory Visit: Payer: Self-pay | Admitting: Cardiology

## 2018-06-29 ENCOUNTER — Telehealth (HOSPITAL_COMMUNITY): Payer: Self-pay | Admitting: Vascular Surgery

## 2018-06-29 NOTE — Telephone Encounter (Signed)
Left pt message that 4/15 appt will be canceled due to Covid, and she is to call office back to make a televisit.

## 2018-06-30 ENCOUNTER — Encounter (HOSPITAL_COMMUNITY): Payer: Self-pay | Admitting: Internal Medicine

## 2018-07-06 ENCOUNTER — Telehealth: Payer: Self-pay

## 2018-07-06 NOTE — Telephone Encounter (Signed)
Called and got patients verbal consent to virtual visit for date and time listed below.    TELEPHONE CALL NOTE  This patient has been deemed a candidate for follow-up tele-health visit to limit community exposure during the Covid-19 pandemic. I spoke with the patient via phone to discuss instructions. This has been outlined on the patient's AVS (dotphrase: hcevisitinfo). The patient was advised to review the section on consent for treatment as well. The patient will receive a phone call 2-3 days prior to their E-Visit at which time consent will be verbally confirmed.   A Virtual Office Visit appointment type has been scheduled for /24/2020 at 10:00am with Dr Mayford Knife, with "VIDEO" or "TELEPHONE" in the appointment notes - patient prefers VIDEO type.  I have either confirmed the patient is active in MyChart or offered to send sign-up link to phone/email via Mychart icon beside patient's photo. Not Active  Kylie [name], [facility] 07/06/2018 4:33 PM      Virtual Visit Pre-Appointment Phone Call  "(Name), I am calling you today to discuss your upcoming appointment. We are currently trying to limit exposure to the virus that causes COVID-19 by seeing patients at home rather than in the office."  1. "What is the BEST phone number to call the day of the visit?" - include this in appointment notes  2. "Do you have or have access to (through a family member/friend) a smartphone with video capability that we can use for your visit?" a. If yes - list this number in appt notes as "cell" (if different from BEST phone #) and list the appointment type as a VIDEO visit in appointment notes b. If no - list the appointment type as a PHONE visit in appointment notes  3. Confirm consent - "In the setting of the current Covid19 crisis, you are scheduled for a (phone or video) visit with your provider on (date) at (time).  Just as we do with many in-office visits, in order for you to participate in this visit, we must  obtain consent.  If you'd like, I can send this to your mychart (if signed up) or email for you to review.  Otherwise, I can obtain your verbal consent now.  All virtual visits are billed to your insurance company just like a normal visit would be.  By agreeing to a virtual visit, we'd like you to understand that the technology does not allow for your provider to perform an examination, and thus may limit your provider's ability to fully assess your condition. If your provider identifies any concerns that need to be evaluated in person, we will make arrangements to do so.  Finally, though the technology is pretty good, we cannot assure that it will always work on either your or our end, and in the setting of a video visit, we may have to convert it to a phone-only visit.  In either situation, we cannot ensure that we have a secure connection.  Are you willing to proceed?" STAFF: Did the patient verbally acknowledge consent to telehealth visit? Document YES/NO here: YES  4. Advise patient to be prepared - "Two hours prior to your appointment, go ahead and check your blood pressure, pulse, oxygen saturation, and your weight (if you have the equipment to check those) and write them all down. When your visit starts, your provider will ask you for this information. If you have an Apple Watch or Kardia device, please plan to have heart rate information ready on the day of your appointment.  Please have a pen and paper handy nearby the day of the visit as well."  5. Give patient instructions for MyChart download to smartphone OR Doximity/Doxy.me as below if video visit (depending on what platform provider is using)  6. Inform patient they will receive a phone call 15 minutes prior to their appointment time (may be from unknown caller ID) so they should be prepared to answer    TELEPHONE CALL NOTE  Kylie [name] has been deemed a candidate for a follow-up tele-health visit to limit community exposure during  the Covid-19 pandemic. I spoke with the patient via phone to ensure availability of phone/video source, confirm preferred email & phone number, and discuss instructions and expectations.  I reminded Kylie [name] to be prepared with any vital sign and/or heart rhythm information that could potentially be obtained via home monitoring, at the time of her visit. I reminded Kylie [name] to expect a phone call prior to her visit.  Kylie [name], CMA 07/06/2018 4:34 PM   INSTRUCTIONS FOR DOWNLOADING THE MYCHART APP TO SMARTPHONE  - The patient must first make sure to have activated MyChart and know their login information - If Apple, go to [employer] and type in MyChart in the search bar and download the app. If Android, ask patient to go to [employer] and type in [place] in the search bar and download the app. The app is free but as with any other app downloads, their phone may require them to verify saved payment information or Apple/Android password.  - The patient will need to then log into the app with their MyChart username and password, and select [place] as their healthcare provider to link the account. When it is time for your visit, go to the MyChart app, find appointments, and click Begin Video Visit. Be sure to Select Allow for your device to access the Microphone and Camera for your visit. You will then be connected, and your provider will be with you shortly.  **If they have any issues connecting, or need assistance please contact MyChart service desk [phone])**  **If using a computer, in order to ensure the best quality for their visit they will need to use either of the following Internet Browsers: [employer] or Google Chrome**  IF USING DOXIMITY or DOXY.ME - The patient will receive a link just prior to their visit by text.     FULL LENGTH CONSENT FOR TELE-HEALTH VISIT   I hereby voluntarily request, consent and authorize [facility] and its  employed or contracted physicians, physician assistants, nurse practitioners or other licensed health care professionals (the Practitioner), to provide me with telemedicine health care services (the "Services") as deemed necessary by the treating Practitioner. I acknowledge and consent to receive the Services by the Practitioner via telemedicine. I understand that the telemedicine visit will involve communicating with the Practitioner through live audiovisual communication technology and the disclosure of certain medical information by electronic transmission. I acknowledge that I have been given the opportunity to request an in-person assessment or other available alternative prior to the telemedicine visit and am voluntarily participating in the telemedicine visit.  I understand that I have the right to withhold or withdraw my consent to the use of telemedicine in the course of my care at any time, without affecting my right to future care or treatment, and that the Practitioner or I may terminate the telemedicine visit at any time. I understand that I have the right to  inspect all information obtained and/or recorded in the course of the telemedicine visit and may receive copies of available information for a reasonable fee.  I understand that some of the potential risks of receiving the Services via telemedicine include:  Marland Kitchen. Delay or interruption in medical evaluation due to technological equipment failure or disruption; . Information transmitted may not be sufficient (e.g. poor resolution of images) to allow for appropriate medical decision making by the Practitioner; and/or  . In rare instances, security protocols could fail, causing a breach of personal health information.  Furthermore, I acknowledge that it is my responsibility to provide information about my medical history, conditions and care that is complete and accurate to the best of my ability. I acknowledge that Practitioner's advice,  recommendations, and/or decision may be based on factors not within their control, such as incomplete or inaccurate data provided by me or distortions of diagnostic images or specimens that may result from electronic transmissions. I understand that the practice of medicine is not an exact science and that Practitioner makes no warranties or guarantees regarding treatment outcomes. I acknowledge that I will receive a copy of this consent concurrently upon execution via email to the email address I last provided but may also request a printed copy by calling the office of [facility].    I understand that my insurance will be billed for this visit.   I have read or had this consent read to me. . I understand the contents of this consent, which adequately explains the benefits and risks of the Services being provided via telemedicine.  . I have been provided ample opportunity to ask questions regarding this consent and the Services and have had my questions answered to my satisfaction. . I give my informed consent for the services to be provided through the use of telemedicine in my medical care  By participating in this telemedicine visit I agree to the above.

## 2018-07-08 NOTE — Progress Notes (Signed)
Virtual Visit via Video Note   This visit type was conducted due to national recommendations for restrictions regarding the COVID-19 Pandemic (e.g. social distancing) in an effort to limit this patient's exposure and mitigate transmission in our community.  Due to her co-morbid illnesses, this patient is at least at moderate risk for complications without adequate follow up.  This format is felt to be most appropriate for this patient at this time.  All issues noted in this document were discussed and addressed.  A limited physical exam was performed with this format.  Please refer to the patient's chart for her consent to telehealth for [facility].  Evaluation Performed:  Follow-up visit  This visit type was conducted due to national recommendations for restrictions regarding the COVID-19 Pandemic (e.g. social distancing).  This format is felt to be most appropriate for this patient at this time.  All issues noted in this document were discussed and addressed.  No physical exam was performed (except for noted visual exam findings with Video Visits).  Please refer to the patient's chart (MyChart message for video visits and phone note for telephone visits) for the patient's consent to telehealth for [facility].  Date:  07/09/2018   ID:  Kylie [name], DOB [birth year], MRN [id number]  Patient Location:  Home  Provider location:   [place]  PCP:  Kirstie Peri, MD  Cardiologist:  Armanda Magic, MD Electrophysiologist:  None   Chief Complaint:  CHF, pulmonary HTN, OSA  History of Present Illness:    Kylie [name] is a 58 y.o. female who presents via audio/video conferencing for a telehealth visit today.    Kylie [name] is a 58 y.o. female with a hx of chronic diastolic CHF withmild pulnonary HTN by echo with PASP felt multifactorial from diastolic HF, HTN, obesity and lupus. She also hassevere OSA with an AHI of 36/hr with oxygen desaturations as low as 78%. She is on  CPAP at10cm H2O.   She is here today for followup and is doing well.  She denies any chest pain or pressure, PND, orthopnea,  dizziness, palpitations or syncope.  She has not been compliant with her heart failure meds and has been actually only taking Lasix 40 mg once daily instead of twice daily.  She tells me that she has had more shortness of breath with exertion than she normally has.  She says she would like to go out and walk but she gets short of breath quickly.  She also has had more lower extremity edema especially in the right lower extremity than she normally gets.  Her weight is up 35 pounds from what it was a year ago.  She is doing well with her CPAP device and thinks that she has gotten used to it.  She feels the pressure is adequate.  She does not like her current full facemask because she says it hurts her head and would like to try different mask.  Since going on CPAP she feels rested in the am and has no significant daytime sleepiness.  She denies any significant mouth or nasal dryness or nasal congestion.  She does not think that he snores.    The patient does not have symptoms concerning for COVID-19 infection (fever, chills, cough, or new shortness of breath).    Prior CV studies:   The following studies were reviewed today:  PAP compliance download, 2D echo 12/2016  Past Medical History:  Diagnosis Date   Fibromyalgia    Hearing  loss    Hypertension    Morbid obesity (HCC)    Obesity (BMI 30-39.9) 04/14/2014   OSA (obstructive sleep apnea) 02/07/2015   Severe with AHI 36/hr now on CPAP at 10cm H2O   Pulmonary HTN (HCC) 04/14/2014   Sinus tachycardia 04/14/2014   SLE (systemic lupus erythematosus) (HCC)    Tobacco use    Past Surgical History:  Procedure Laterality Date   CESAREAN SECTION     CHOLECYSTECTOMY     COLONOSCOPY N/A 07/09/2016   Procedure: COLONOSCOPY;  Surgeon: [name], MD;  Location: [facility];  Service: Endoscopy;  Laterality:  N/A;  9:00am   RIGHT HEART CATHETERIZATION N/A 05/31/2014   Procedure: RIGHT HEART CATH;  Surgeon: [name], MD;  [facility];  Service: Cardiovascular;  Laterality: N/A;     Current Meds  Medication Sig   acetaminophen (TYLENOL) 500 MG tablet Take 500 mg by mouth as needed.   Ascorbic Acid (VITAMIN C PO) Take 1 tablet by mouth daily.   Calcium Carbonate-Vitamin D (CALCIUM PLUS VITAMIN D PO) Take 1 tablet by mouth daily. Takes with Milk Chocolate   chlorzoxazone (PARAFON) 500 MG tablet Take 500 mg by mouth 4 (four) times daily as needed for muscle spasms.   Cholecalciferol (VITAMIN D3 PO) Take 1 tablet by mouth daily.   furosemide (LASIX) 40 MG tablet TAKE (1) TABLET TWICE A DAY.   hydroxychloroquine (PLAQUENIL) 200 MG tablet Take 600 mg by mouth daily.    lisinopril (PRINIVIL,ZESTRIL) 40 MG tablet Take 1 tablet (40 mg total) by mouth daily.   Omega-3 Fatty Acids (FISH OIL PO) Take 1 tablet by mouth daily.   PARoxetine (PAXIL) 40 MG tablet Take 40 mg by mouth every morning.   predniSONE (DELTASONE) 10 MG tablet Take 5 mg by mouth daily.   spironolactone (ALDACTONE) 25 MG tablet TAKE (1/2) TABLET DAILY.   traZODone (DESYREL) 50 MG tablet Take 100 mg by mouth at bedtime.    VITAMIN E PO Take 1 tablet by mouth daily.     Allergies:   Ibuprofen; Amoxicillin; and Sulfa antibiotics   Social History   Tobacco Use   Smoking status: Current Every Day Smoker    Packs/day: 0.25    Years: 5.00    Pack years: 1.25    Types: Cigarettes   Smokeless tobacco: Never Used  Substance Use Topics   Alcohol use: No   Drug use: No     Family Hx: The patient's family history includes Coronary artery disease in her father.  ROS:   Please see the history of present illness.     All other systems reviewed and are negative.   Labs/Other Tests and Data Reviewed:    Recent Labs: No results found for requested labs within last 8760 hours.   Recent Lipid  Panel No results found for: CHOL, TRIG, HDL, CHOLHDL, LDLCALC, LDLDIRECT  Wt Readings from Last 3 Encounters:  05/01/17 245 lb 3.2 oz (111.2 kg)  12/22/16 239 lb 12.8 oz (108.8 kg)  07/09/16 231 lb (104.8 kg)     Objective:    Vital Signs:  BP 116/79    Pulse 87    Ht 5' (1.524 m)    BMI 47.89 kg/m    CONSTITUTIONAL:  Well nourished, well developed female in no acute distress.  EYES: anicteric MOUTH: oral mucosa is pink RESPIRATORY: Normal respiratory effort, symmetric expansion CARDIOVASCULAR: moderate LE edema R>L SKIN: No rash, lesions or ulcers MUSCULOSKELETAL: no digital cyanosis NEURO:  Cranial Nerves II-XII grossly intact, moves all extremities PSYCH: Intact judgement and insight.  A&O x 3, Mood/affect appropriate   ASSESSMENT & PLAN:    1. OSA - the patient is tolerating PAP therapy well without any problems. The PAP download was reviewed today and showed an AHI of 1.3/hr on 10 cm H2O with 100% compliance in using more than 4 hours nightly.  The patient has been using and benefiting from PAP use and will continue to benefit from therapy. She is having problems with the mask hurting her head and wants to try a different mask. I will order her a Respironics Dreamware under the nose full face mask to see how she does with that.  2.  Chronic diastolic CHF - she has chronic DOE that is fairly stable and likely related to obesity and deconditioning.   She has chronic RLE edema which is chronic but she thinks it may be worse over the past month. She has also gained 35lbs since her last weight a year ago.  She says that she wears compression hose to help. She will continue on spironolactone  daily. She was supposed to be taking Lasix  BID but is only taking  once daily. I have instructed her to increase Lasix to  BID and I will get a BMET in 1 week.     3.  Obesity - I have encouraged her to get into a routine exercise program and cut back on carbs and portions.   4.   Pulmonary HTN - there was no evidence of pulmonary HTN on ecoh 2018.  5.  Hypertension - she checks her BP at home and it has been controlled.  She will continue on Spironolactone  daily, Lisinopril  daily.  6.  COVID-19 Education:The signs and symptoms of COVID-19 were discussed with the patient and how to seek care for testing (follow up with PCP or arrange E-visit).  The importance of social distancing was discussed today.  Patient Risk:   After full review of this patient's clinical status, I feel that they are at least moderate risk at this time.  Time:   Today, I have spent 20 minutes directly with the patient on video discussing medical problems including Weight gain, SOB, LE edema, CHF, OSA and reviewing PAP compliance data and HTN.  We also reviewed the symptoms of COVID 19 and the ways to protect against contracting the virus with telehealth technology.  I spent an additional 10 minutes reviewing patient's chart including prior office notes, weight chart and PAP compliance report.  Medication Adjustments/Labs and Tests Ordered: Current medicines are reviewed at length with the patient today.  Concerns regarding medicines are outlined above.  Tests Ordered: No orders of the defined types were placed in this encounter.  Medication Changes: No orders of the defined types were placed in this encounter.   Disposition:  Follow up in 1 week(s)  Signed, Armanda Magic, MD  07/09/2018 9:56 AM    [facility]

## 2018-07-09 ENCOUNTER — Encounter: Payer: Self-pay | Admitting: Cardiology

## 2018-07-09 ENCOUNTER — Other Ambulatory Visit: Payer: Self-pay

## 2018-07-09 ENCOUNTER — Telehealth (INDEPENDENT_AMBULATORY_CARE_PROVIDER_SITE_OTHER): Payer: Medicare Other | Admitting: Cardiology

## 2018-07-09 VITALS — BP 116/79 | HR 87 | Ht 60.0 in

## 2018-07-09 DIAGNOSIS — I10 Essential (primary) hypertension: Secondary | ICD-10-CM | POA: Diagnosis not present

## 2018-07-09 DIAGNOSIS — E669 Obesity, unspecified: Secondary | ICD-10-CM

## 2018-07-09 DIAGNOSIS — G4733 Obstructive sleep apnea (adult) (pediatric): Secondary | ICD-10-CM

## 2018-07-09 DIAGNOSIS — I272 Pulmonary hypertension, unspecified: Secondary | ICD-10-CM

## 2018-07-09 DIAGNOSIS — Z7189 Other specified counseling: Secondary | ICD-10-CM | POA: Diagnosis not present

## 2018-07-09 DIAGNOSIS — I5032 Chronic diastolic (congestive) heart failure: Secondary | ICD-10-CM | POA: Diagnosis not present

## 2018-07-09 MED ORDER — FUROSEMIDE 40 MG PO TABS: 40.0000 mg | ORAL_TABLET | Freq: Two times a day (BID) | ORAL | 3 refills | Status: DC

## 2018-07-09 NOTE — Patient Instructions (Signed)
Medication Instructions:  Start: Lasix 40 mg, twice a day, by mouth  If you need a refill on your cardiac medications before your next appointment, please call your pharmacy.   Lab work: None If you have labs (blood work) drawn today and your tests are completely normal, you will receive your results only by: Marland Kitchen MyChart Message (if you have MyChart) OR . A paper copy in the mail If you have any lab test that is abnormal or we need to change your treatment, we will call you to review the results.  Testing/Procedures: None  Follow-Up: Keep scheduled follow up with Dr. Mayford Knife in 1 week.

## 2018-07-13 ENCOUNTER — Telehealth: Payer: Self-pay | Admitting: *Deleted

## 2018-07-13 NOTE — Telephone Encounter (Signed)
Informed patient of compliance results and verbalized understanding was indicated. °Patient is aware and agreeable to AHI being within range at 1.3. °Patient is aware and agreeable to being in compliance with machine usage. °Patient is aware and agreeable to no change in current pressures. °

## 2018-07-13 NOTE — Telephone Encounter (Signed)
-----   Message from Quintella Reichert, MD sent at 07/13/2018 10:51 AM EDT ----- Good AHI and compliance.  Continue current PAP settings.

## 2018-07-15 NOTE — Progress Notes (Signed)
Virtual Visit via Video Note   This visit type was conducted due to national recommendations for restrictions regarding the COVID-19 Pandemic (e.g. social distancing) in an effort to limit this patient's exposure and mitigate transmission in our community.  Due to her co-morbid illnesses, this patient is at least at moderate risk for complications without adequate follow up.  This format is felt to be most appropriate for this patient at this time.  All issues noted in this document were discussed and addressed.  A limited physical exam was performed with this format.  Please refer to the patient's chart for her consent to telehealth for [facility].   Evaluation Performed:  Follow-up visit  This visit type was conducted due to national recommendations for restrictions regarding the COVID-19 Pandemic (e.g. social distancing).  This format is felt to be most appropriate for this patient at this time.  All issues noted in this document were discussed and addressed.  No physical exam was performed (except for noted visual exam findings with Video Visits).  Please refer to the patient's chart (MyChart message for video visits and phone note for telephone visits) for the patient's consent to telehealth for [facility].  Date:  07/16/2018   ID:  Kylie [name], DOB 09/17/1960, MRN [id number]  Patient Location:  Home  [place]   [place]  PCP:  [name], [name], MD  Cardiologist:  Armanda Magicraci Chelsey Kimberley, MD  Electrophysiologist:  None   Chief Complaint:  CHF, Pulmonary HTN, OSA  History of Present Illness:    Kylie [name] is a 58 y.o. female who presents via audio/video conferencing for a telehealth visit today.    Kylie [name] a 58 y.o.femalewith a hx of chronic diastolic CHF withmild pulnonary HTN by echo with PASP 32mmHg felt multifactorial from diastolic HF, HTN, obesity and lupus. She also hassevere OSA with an AHI of 36/hr with oxygen desaturations as low as 78%. She is  on CPAP at10cm H2O.  She was seen a week ago with complaints of increased SOB and increased LE edema but had not been compliant with her lasix and weight was also up.  She has known chronic DOE that is related to obesity and deconditioning.  Her Lasix was increased to 40 mg twice daily.  She is now back today for follow-up.  She says she has not really lost any weight since increasing the Lasix dose.  She thinks her lower extremity edema is slightly better but she still has it.  She also still has some mild shortness of breath.  She tells me that she has not salting her food at all.  The patient does not have symptoms concerning for COVID-19 infection (fever, chills, cough, or new shortness of breath).   Prior CV studies:   The following studies were reviewed today:  none  Past Medical History:  Diagnosis Date  . Fibromyalgia   . Hearing loss   . Hypertension   . Morbid obesity (HCC)   . Obesity (BMI 30-39.9) 04/14/2014  . OSA (obstructive sleep apnea) 02/07/2015   Severe with AHI 36/hr now on CPAP at 10cm H2O  . Pulmonary HTN (HCC) 04/14/2014  . Sinus tachycardia 04/14/2014  . SLE (systemic lupus erythematosus) (HCC)   . Tobacco use    Past Surgical History:  Procedure Laterality Date  . CESAREAN SECTION    . CHOLECYSTECTOMY    . COLONOSCOPY N/A 07/09/2016   Procedure: COLONOSCOPY;  Surgeon: [name], MD;  [facility];  Service:  Endoscopy;  Laterality: N/A;  9:00am  . RIGHT HEART CATHETERIZATION N/A 05/31/2014   Procedure: RIGHT HEART CATH;  Surgeon: Laurey Morale, MD;  Location: [facility] CATH LAB;  Service: Cardiovascular;  Laterality: N/A;     Current Meds  Medication Sig  . acetaminophen (TYLENOL) 500 MG tablet Take 500 mg by mouth as needed.  . Ascorbic Acid (VITAMIN C PO) Take 1 tablet by mouth daily.  . Calcium Carbonate-Vitamin D (CALCIUM PLUS VITAMIN D PO) Take 1 tablet by mouth daily. Takes with Milk Chocolate  . chlorzoxazone (PARAFON) 500 MG tablet Take  500 mg by mouth 4 (four) times daily as needed for muscle spasms.  . Cholecalciferol (VITAMIN D3 PO) Take 1 tablet by mouth daily.  . furosemide (LASIX) 40 MG tablet Take 1 tablet (40 mg total) by mouth 2 (two) times daily.  . hydroxychloroquine (PLAQUENIL) 200 MG tablet Take 600 mg by mouth daily.   Marland Kitchen lisinopril (PRINIVIL,ZESTRIL) 40 MG tablet Take 1 tablet (40 mg total) by mouth daily.  . Omega-3 Fatty Acids (FISH OIL PO) Take 1 tablet by mouth daily.  Marland Kitchen PARoxetine (PAXIL) 40 MG tablet Take 40 mg by mouth every morning.  . predniSONE (DELTASONE) 10 MG tablet Take 5 mg by mouth daily.  Marland Kitchen spironolactone (ALDACTONE) 25 MG tablet TAKE (1/2) TABLET DAILY.  . traZODone (DESYREL) 50 MG tablet Take 100 mg by mouth at bedtime.   Marland Kitchen VITAMIN E PO Take 1 tablet by mouth daily.     Allergies:   Ibuprofen; Amoxicillin; and Sulfa antibiotics   Social History   Tobacco Use  . Smoking status: Current Every Day Smoker    Packs/day: 0.25    Years: 5.00    Pack years: 1.25    Types: Cigarettes  . Smokeless tobacco: Never Used  Substance Use Topics  . Alcohol use: No  . Drug use: No     Family Hx: The patient's family history includes Coronary artery disease in her father.  ROS:   Please see the history of present illness.     All other systems reviewed and are negative.   Labs/Other Tests and Data Reviewed:    Recent Labs: No results found for requested labs within last 8760 hours.   Recent Lipid Panel No results found for: CHOL, TRIG, HDL, CHOLHDL, LDLCALC, LDLDIRECT  Wt Readings from Last 3 Encounters:  07/16/18 180 lb 6.4 oz (81.8 kg)  05/01/17 245 lb 3.2 oz (111.2 kg)  12/22/16 239 lb 12.8 oz (108.8 kg)     Objective:    Vital Signs:  BP 121/77   Pulse 99   Ht  (1.626 m)   Wt 180 lb 6.4 oz (81.8 kg)   BMI 30.97 kg/m    CONSTITUTIONAL:  Well nourished, well developed female in no acute distress.  EYES: anicteric MOUTH: oral mucosa is pink RESPIRATORY: Normal  respiratory effort, symmetric expansion CARDIOVASCULAR: No peripheral edema SKIN: No rash, lesions or ulcers MUSCULOSKELETAL: no digital cyanosis NEURO: Cranial Nerves II-XII grossly intact, moves all extremities PSYCH: Intact judgement and insight.  A&O x 3, Mood/affect appropriate   ASSESSMENT & PLAN:    1.  Chronic diastolic CHF -after increasing Lasix to 40 mg twice daily which was supposed to be her routine regimen she she has had some mild improvement in her lower extremity edema but it is still present and she is still having some mild shortness of breath.  Her weight is the same as it was a week ago.  She  states that she does not use any added salt in her diet and that she does not like salt.  At this point I am going to send a [facility] home [facility] nurses out to evaluate her to determine if she really is volume overloaded and help [place] in trying to determine if we need to increase her diuretic further.  For now I will not make any medication changes until we have gotten the home health nurse out to assess.  2.  Pulmonary hypertension -this appears resolved on echo 2018  3.  Hypertension - her blood pressure is well controlled.  She will continue on spironolactone 25 mg daily and lisinopril 40 mg daily.  4.  COVID-19 Education:The signs and symptoms of COVID-19 were discussed with the patient and how to seek care for testing (follow up with PCP or arrange E-visit).  The importance of social distancing was discussed today.  Patient Risk:   After full review of this patient's clinical status, I feel that they are at least moderate risk at this time.  Time:   Today, I have spent 15 minutes directly with the patient on vidoe discussing medical problems including CHF management, HTN and pulmonary HTN.  We also reviewed the symptoms of COVID 19 and the ways to protect against contracting the virus with telehealth technology.  I spent an additional 5 minutes reviewing patient's chart including  prior OV notes.  Medication Adjustments/Labs and Tests Ordered: Current medicines are reviewed at length with the patient today.  Concerns regarding medicines are outlined above.  Tests Ordered: No orders of the defined types were placed in this encounter.  Medication Changes: No orders of the defined types were placed in this encounter.   Disposition:  Follow up in 2 weeks   Signed, Armanda Magic, MD  07/16/2018 9:07 AM    [facility]

## 2018-07-16 ENCOUNTER — Telehealth: Payer: Self-pay

## 2018-07-16 ENCOUNTER — Other Ambulatory Visit: Payer: Self-pay

## 2018-07-16 ENCOUNTER — Encounter: Payer: Self-pay | Admitting: Cardiology

## 2018-07-16 ENCOUNTER — Telehealth (INDEPENDENT_AMBULATORY_CARE_PROVIDER_SITE_OTHER): Payer: Medicare Other | Admitting: Cardiology

## 2018-07-16 VITALS — BP 121/77 | HR 99 | Ht 64.0 in | Wt 180.4 lb

## 2018-07-16 DIAGNOSIS — I10 Essential (primary) hypertension: Secondary | ICD-10-CM

## 2018-07-16 DIAGNOSIS — E669 Obesity, unspecified: Secondary | ICD-10-CM

## 2018-07-16 DIAGNOSIS — I5032 Chronic diastolic (congestive) heart failure: Secondary | ICD-10-CM

## 2018-07-16 DIAGNOSIS — I272 Pulmonary hypertension, unspecified: Secondary | ICD-10-CM | POA: Diagnosis not present

## 2018-07-16 NOTE — Telephone Encounter (Signed)
[facility] Home Visit Request  Agency Requested:  [facility] Contact:  Dara Hoyer, RN Phone #: [phone] Fax #: [phone]  Patient Information: Name:  Kylie [name]  DOB:  1960/08/09  MRN:  [id number]  Address:   [address] [place] [address]  Phone Numbers:   Home Phone [phone]  Work Phone [phone]  Mobile [phone]     Requesting Provider:   Dr. Mayford Knife  Diagnosis:   Chronic Diastolic CHF  Reason for Visit:   Assess volume overload. Patient doubled lasix and still has not improved   Services Requested:  ReDS Heart Failure Measurement and Physical exam  # of Visits Needed/Frequency per Week: 3 visits, once a week

## 2018-07-16 NOTE — Telephone Encounter (Addendum)
Received call from Nurse after home visit. Sp02: 97, HR:102-105, standing HR: 113, BP: 124/88, standing BP: 119/90.  Lungs clear, more edema on the right than left.  ReD Vest: 43 %  Edema RUE: +1 RLE: +2/+3 LUE: +1 LLE: +1  Abdomen is distended and slightly painful.  No JVD

## 2018-07-16 NOTE — Patient Instructions (Signed)
Medication Instructions:  Your physician recommends that you continue on your current medications as directed. Please refer to the Current Medication list given to you today.  If you need a refill on your cardiac medications before your next appointment, please call your pharmacy.   Lab work: None If you have labs (blood work) drawn today and your tests are completely normal, you will receive your results only by: Marland Kitchen MyChart Message (if you have MyChart) OR . A paper copy in the mail If you have any lab test that is abnormal or we need to change your treatment, we will call you to review the results.  Testing/Procedures: None  Follow-Up: On 08/04/18 at 9 am.  Home Health Check ordered for 3 visits, once a week to assess volume overload.

## 2018-07-19 ENCOUNTER — Telehealth: Payer: Self-pay | Admitting: *Deleted

## 2018-07-19 NOTE — Telephone Encounter (Signed)
Informed patient of compliance results and verbalized understanding was indicated. Patient is aware and agreeable to AHI being within range at 3.1. Patient is aware and agreeable to being in compliance with machine usage Patient is aware and agreeable to no change in current pressures 

## 2018-07-19 NOTE — Telephone Encounter (Signed)
Please order Metolazone 2.5mg  Tuesday and Wednesday along with Kdur on Tuesday and Wednesday  of this week with same Lasix dose she is currently on and then have [facility] nurse reassess on Thursday.  Please see if they can do a rhythm strip or EKG when they go there to see if she is in afib since her HR was elevated on nurse visit.  Also see if they can do a BMET

## 2018-07-19 NOTE — Telephone Encounter (Signed)
-----   Message from Quintella Reichert, MD sent at 07/16/2018  9:54 AM EDT ----- Good AHI and compliance.  Continue current PAP settings.

## 2018-07-20 MED ORDER — POTASSIUM CHLORIDE CRYS ER 20 MEQ PO TBCR: 40.0000 meq | EXTENDED_RELEASE_TABLET | Freq: Two times a day (BID) | ORAL | 0 refills | Status: DC

## 2018-07-20 MED ORDER — METOLAZONE 2.5 MG PO TABS: 2.5000 mg | ORAL_TABLET | Freq: Every day | ORAL | 0 refills | Status: DC

## 2018-07-20 NOTE — Addendum Note (Signed)
Addended by: Dustin Flock on: 07/20/2018 03:53 PM   Modules accepted: Orders

## 2018-07-20 NOTE — Telephone Encounter (Addendum)
[facility] Home Visit Request  Agency Requested:  [facility] Contact:  Curly Rim, NP Phone #:  [phone] Fax #:  [phone]  Patient Information: Name:  Kylie [name]  DOB:  1960-10-26  MRN:  [id number]  Address:   [address] [place] [address]  Phone Numbers:   Home Phone [phone]  Work Phone [phone]  Mobile [phone]     Requesting Provider:   Dr. Mayford Knife  Diagnosis:   CHF  Reason for Visit:   Assess fluid status and Alive Cor EKG  Services Requested:  Rhythm Strip (AliveCor Device)   Labs: BMET  Assess fluid overload status  # of Visits Needed/Frequency per Week: A visit on 5/7 if possible and a follow up next week.

## 2018-07-20 NOTE — Telephone Encounter (Signed)
Spoke with the patient she expressed understanding about taking her medications and home health orders sent.

## 2018-07-21 ENCOUNTER — Telehealth: Payer: Self-pay | Admitting: Cardiology

## 2018-07-21 NOTE — Telephone Encounter (Signed)
Spoke with the [facility] nurse, she stated that they do not have the ability to perform Alive Cor EKG and she also said that they are not able to draw labs in the patient's home. This would need to be ordered under a home health referral. Do you still want her to go on Thursday to do a fluid assessment/ Red vest?

## 2018-07-21 NOTE — Telephone Encounter (Signed)
New message    Tiffany Is calling to speak with Romeo Apple   Please call

## 2018-07-21 NOTE — Telephone Encounter (Signed)
Sent home health note to [facility] to draw labs and check fluid status.

## 2018-07-21 NOTE — Telephone Encounter (Signed)
Yes still have her go to patient's house to do Vest assessment.  Please see if we can get a home health referral for once a week blood draws for CHF management and EKG to make sure she is not in afib

## 2018-07-21 NOTE — Telephone Encounter (Signed)
Left a message with [facility].

## 2018-07-22 ENCOUNTER — Other Ambulatory Visit: Payer: Self-pay | Admitting: *Deleted

## 2018-07-22 DIAGNOSIS — I10 Essential (primary) hypertension: Secondary | ICD-10-CM

## 2018-07-22 DIAGNOSIS — I5032 Chronic diastolic (congestive) heart failure: Secondary | ICD-10-CM

## 2018-07-22 NOTE — Progress Notes (Addendum)
Kylie [name]       DOB: Jan 19, 1961  Purpose of Visit: BMET, EKG, ReDS HF provider:  Medications: Is the patient taking all medications listed on MAR from Epic? Yes  List any medications that are not being taken correctly: n/a  List any medication refills needed:  Is the patient able to pick up medications? Yes  Vitals: BP:  114/84   HR: 92   Oxygen:  98%RA Weight:    275.6lb        Physical Exam:  Lung sounds: clear but diminished  Heart sounds: regular  Peripheral edema: yes: RLE +2, LLE +1  Wounds: no  Location:  Any patient concerns?   States she doesn't feel much better after last weeks home visit and being RX'd additional metolazone & KDUR x 2 days.  States she gets shob and has upper chest pressure w/exertion.  Reports that at rest the pain and shob subsides but has ongoing lumbar pain.  Skin was warm, pink, & dry and spoke in full sentences, although she has a pre-exisiting slur to her speech. She was in no distress while at rest during the visit.    ReDS Vest/Clip Reading:  44% on first reading, 43% second reading.   Rhythm Strip: Done:  NSR 95     Rose Fillers, RN 07/22/18

## 2018-07-23 ENCOUNTER — Inpatient Hospital Stay: Admission: AD | Admit: 2018-07-23 | Payer: Medicare Other | Source: Ambulatory Visit | Admitting: Cardiology

## 2018-07-23 ENCOUNTER — Telehealth: Payer: Self-pay

## 2018-07-23 LAB — BASIC METABOLIC PANEL
BUN/Creatinine Ratio: 18 (ref 9–23)
BUN: 23 mg/dL (ref 6–24)
CO2: 24 mmol/L (ref 20–29)
Calcium: 9.9 mg/dL (ref 8.7–10.2)
Chloride: 86 mmol/L — ABNORMAL LOW (ref 96–106)
Creatinine, Ser: 1.25 mg/dL — ABNORMAL HIGH (ref 0.57–1.00)
GFR calc Af Amer: 55 mL/min/{1.73_m2} — ABNORMAL LOW (ref >59)
GFR calc non Af Amer: 48 mL/min/{1.73_m2} — ABNORMAL LOW (ref >59)
Glucose: 89 mg/dL (ref 65–99)
Potassium: 4.2 mmol/L (ref 3.5–5.2)
Sodium: 129 mmol/L — ABNORMAL LOW (ref 134–144)

## 2018-07-23 NOTE — Telephone Encounter (Signed)
-----   Message from Quintella Reichert, MD sent at 07/23/2018 11:56 AM EDT ----- Reviewed nursing notes from yesterday.  Patient's best still elevated 45% consistent with volume overload despite getting 2 days of metolazone.  She is having some shortness of breath and some chest tightness as well.  I have recommended that she be admitted to the hospital as a direct admit for further evaluation of heart failure that has been difficult to treat as an outpatient.

## 2018-07-23 NOTE — Telephone Encounter (Signed)
Patient accepted a direct admission to the hospital.

## 2018-07-23 NOTE — Telephone Encounter (Signed)
Notes recorded by Quintella Reichert, MD on 07/23/2018 at 11:51 AM EDT Sodium is low probably due to diuresis. Creatinine has bumped slightly. Please tell patient to stop and metolazone. She is to continue on current dose of Lasix for now.

## 2018-07-24 ENCOUNTER — Inpatient Hospital Stay (HOSPITAL_COMMUNITY)
Admit: 2018-07-24 | Discharge: 2018-07-27 | DRG: 287 | Disposition: A | Discharge status: Home / Self Care | Payer: Medicare Other | Source: Ambulatory Visit | Attending: Cardiology | Admitting: Cardiology

## 2018-07-24 ENCOUNTER — Telehealth: Payer: Self-pay | Admitting: Cardiology

## 2018-07-24 ENCOUNTER — Emergency Department (HOSPITAL_COMMUNITY)
Admission: EM | Admit: 2018-07-24 | Disposition: A | Discharge status: Still In Hospital | Payer: Medicare Other | Source: Home / Self Care

## 2018-07-24 ENCOUNTER — Other Ambulatory Visit (HOSPITAL_COMMUNITY): Payer: Medicare Other

## 2018-07-24 ENCOUNTER — Encounter (HOSPITAL_COMMUNITY): Payer: Self-pay | Admitting: Cardiology

## 2018-07-24 ENCOUNTER — Other Ambulatory Visit: Payer: Self-pay

## 2018-07-24 DIAGNOSIS — E662 Morbid (severe) obesity with alveolar hypoventilation: Secondary | ICD-10-CM | POA: Diagnosis present

## 2018-07-24 DIAGNOSIS — Z7952 Long term (current) use of systemic steroids: Secondary | ICD-10-CM | POA: Diagnosis not present

## 2018-07-24 DIAGNOSIS — M797 Fibromyalgia: Secondary | ICD-10-CM | POA: Diagnosis present

## 2018-07-24 DIAGNOSIS — I50812 Chronic right heart failure: Secondary | ICD-10-CM | POA: Diagnosis not present

## 2018-07-24 DIAGNOSIS — Z79899 Other long term (current) drug therapy: Secondary | ICD-10-CM

## 2018-07-24 DIAGNOSIS — I5032 Chronic diastolic (congestive) heart failure: Secondary | ICD-10-CM | POA: Diagnosis present

## 2018-07-24 DIAGNOSIS — I5031 Acute diastolic (congestive) heart failure: Secondary | ICD-10-CM | POA: Diagnosis not present

## 2018-07-24 DIAGNOSIS — Z6841 Body Mass Index (BMI) 40.0 and over, adult: Secondary | ICD-10-CM

## 2018-07-24 DIAGNOSIS — I272 Pulmonary hypertension, unspecified: Secondary | ICD-10-CM

## 2018-07-24 DIAGNOSIS — M329 Systemic lupus erythematosus, unspecified: Secondary | ICD-10-CM | POA: Diagnosis present

## 2018-07-24 DIAGNOSIS — Z881 Allergy status to other antibiotic agents status: Secondary | ICD-10-CM

## 2018-07-24 DIAGNOSIS — N179 Acute kidney failure, unspecified: Secondary | ICD-10-CM | POA: Diagnosis present

## 2018-07-24 DIAGNOSIS — R0602 Shortness of breath: Secondary | ICD-10-CM

## 2018-07-24 DIAGNOSIS — E861 Hypovolemia: Secondary | ICD-10-CM | POA: Diagnosis present

## 2018-07-24 DIAGNOSIS — E871 Hypo-osmolality and hyponatremia: Secondary | ICD-10-CM | POA: Diagnosis present

## 2018-07-24 DIAGNOSIS — Z8249 Family history of ischemic heart disease and other diseases of the circulatory system: Secondary | ICD-10-CM | POA: Diagnosis not present

## 2018-07-24 DIAGNOSIS — I110 Hypertensive heart disease with heart failure: Secondary | ICD-10-CM | POA: Diagnosis present

## 2018-07-24 DIAGNOSIS — R0609 Other forms of dyspnea: Secondary | ICD-10-CM | POA: Diagnosis not present

## 2018-07-24 DIAGNOSIS — Z882 Allergy status to sulfonamides status: Secondary | ICD-10-CM

## 2018-07-24 DIAGNOSIS — Z888 Allergy status to other drugs, medicaments and biological substances status: Secondary | ICD-10-CM | POA: Diagnosis not present

## 2018-07-24 DIAGNOSIS — G4733 Obstructive sleep apnea (adult) (pediatric): Secondary | ICD-10-CM | POA: Diagnosis not present

## 2018-07-24 DIAGNOSIS — I2721 Secondary pulmonary arterial hypertension: Secondary | ICD-10-CM | POA: Diagnosis not present

## 2018-07-24 DIAGNOSIS — I5030 Unspecified diastolic (congestive) heart failure: Secondary | ICD-10-CM | POA: Diagnosis present

## 2018-07-24 DIAGNOSIS — I10 Essential (primary) hypertension: Secondary | ICD-10-CM | POA: Diagnosis not present

## 2018-07-24 DIAGNOSIS — I440 Atrioventricular block, first degree: Secondary | ICD-10-CM | POA: Diagnosis present

## 2018-07-24 DIAGNOSIS — Z1159 Encounter for screening for other viral diseases: Secondary | ICD-10-CM

## 2018-07-24 DIAGNOSIS — Z832 Family history of diseases of the blood and blood-forming organs and certain disorders involving the immune mechanism: Secondary | ICD-10-CM

## 2018-07-24 LAB — BASIC METABOLIC PANEL
Anion gap: 12 (ref 5–15)
BUN: 21 mg/dL — ABNORMAL HIGH (ref 6–20)
CO2: 27 mmol/L (ref 22–32)
Calcium: 9.4 mg/dL (ref 8.9–10.3)
Chloride: 84 mmol/L — ABNORMAL LOW (ref 98–111)
Creatinine, Ser: 1.32 mg/dL — ABNORMAL HIGH (ref 0.44–1.00)
GFR calc Af Amer: 51 mL/min — ABNORMAL LOW (ref >60)
GFR calc non Af Amer: 44 mL/min — ABNORMAL LOW (ref >60)
Glucose, Bld: 142 mg/dL — ABNORMAL HIGH (ref 70–99)
Potassium: 4 mmol/L (ref 3.5–5.1)
Sodium: 123 mmol/L — ABNORMAL LOW (ref 135–145)

## 2018-07-24 LAB — CBC
HCT: 40.3 % (ref 36.0–46.0)
Hemoglobin: 13.9 g/dL (ref 12.0–15.0)
MCH: 29.8 pg (ref 26.0–34.0)
MCHC: 34.5 g/dL (ref 30.0–36.0)
MCV: 86.3 fL (ref 80.0–100.0)
Platelets: 253 10*3/uL (ref 150–400)
RBC: 4.67 MIL/uL (ref 3.87–5.11)
RDW: 11.9 % (ref 11.5–15.5)
WBC: 6.1 10*3/uL (ref 4.0–10.5)
nRBC: 0 % (ref 0.0–0.2)

## 2018-07-24 LAB — BRAIN NATRIURETIC PEPTIDE: B Natriuretic Peptide: 7.5 pg/mL (ref 0.0–100.0)

## 2018-07-24 MED ORDER — SODIUM CHLORIDE 0.9 % IV SOLN: 250.0000 mL | INTRAVENOUS | Status: DC | PRN

## 2018-07-24 MED ORDER — METOPROLOL SUCCINATE ER 25 MG PO TB24
12.5000 mg | ORAL_TABLET | Freq: Every day | ORAL | Status: DC
  Administered 2018-07-24 – 2018-07-27 (×4): 12.5 mg via ORAL
  Filled 2018-07-24 (×4): qty 1

## 2018-07-24 MED ORDER — PREDNISONE 5 MG PO TABS
5.0000 mg | ORAL_TABLET | Freq: Every day | ORAL | Status: DC
  Administered 2018-07-25 – 2018-07-27 (×3): 5 mg via ORAL
  Filled 2018-07-24 (×4): qty 1

## 2018-07-24 MED ORDER — LISINOPRIL 40 MG PO TABS
40.0000 mg | ORAL_TABLET | Freq: Every day | ORAL | Status: DC
  Administered 2018-07-24 – 2018-07-27 (×4): 40 mg via ORAL
  Filled 2018-07-24 (×4): qty 1

## 2018-07-24 MED ORDER — SODIUM CHLORIDE 0.9% FLUSH
3.0000 mL | INTRAVENOUS | Status: DC | PRN
  Administered 2018-07-24: 3 mL via INTRAVENOUS
  Filled 2018-07-24: qty 3

## 2018-07-24 MED ORDER — HYDROXYCHLOROQUINE SULFATE 200 MG PO TABS
600.0000 mg | ORAL_TABLET | Freq: Every day | ORAL | Status: DC
  Administered 2018-07-25 – 2018-07-27 (×3): 600 mg via ORAL
  Filled 2018-07-24 (×3): qty 3

## 2018-07-24 MED ORDER — ONDANSETRON HCL 4 MG/2ML IJ SOLN: 4.0000 mg | Freq: Four times a day (QID) | INTRAMUSCULAR | Status: DC | PRN

## 2018-07-24 MED ORDER — SODIUM CHLORIDE 0.9% FLUSH
3.0000 mL | Freq: Two times a day (BID) | INTRAVENOUS | Status: DC
  Administered 2018-07-24 – 2018-07-25 (×3): 3 mL via INTRAVENOUS

## 2018-07-24 MED ORDER — HEPARIN SODIUM (PORCINE) 5000 UNIT/ML IJ SOLN
5000.0000 [IU] | Freq: Three times a day (TID) | INTRAMUSCULAR | Status: DC
  Administered 2018-07-24 – 2018-07-26 (×7): 5000 [IU] via SUBCUTANEOUS
  Filled 2018-07-24 (×7): qty 1

## 2018-07-24 MED ORDER — TRAZODONE HCL 100 MG PO TABS
100.0000 mg | ORAL_TABLET | Freq: Every day | ORAL | Status: DC
  Administered 2018-07-24 – 2018-07-26 (×3): 100 mg via ORAL
  Filled 2018-07-24 (×3): qty 1

## 2018-07-24 MED ORDER — VITAMIN D3 25 MCG (1000 UNIT) PO TABS
1000.0000 [IU] | ORAL_TABLET | Freq: Every day | ORAL | Status: DC
  Administered 2018-07-25 – 2018-07-27 (×3): 1000 [IU] via ORAL
  Filled 2018-07-24 (×7): qty 1

## 2018-07-24 MED ORDER — OMEGA-3-ACID ETHYL ESTERS 1 G PO CAPS
1.0000 g | ORAL_CAPSULE | Freq: Every day | ORAL | Status: DC
  Administered 2018-07-25 – 2018-07-27 (×3): 1 g via ORAL
  Filled 2018-07-24 (×4): qty 1

## 2018-07-24 MED ORDER — LIVING BETTER WITH HEART FAILURE BOOK: Freq: Once | Status: DC

## 2018-07-24 MED ORDER — PAROXETINE HCL 20 MG PO TABS
40.0000 mg | ORAL_TABLET | Freq: Every day | ORAL | Status: DC
  Administered 2018-07-24 – 2018-07-27 (×4): 40 mg via ORAL
  Filled 2018-07-24 (×4): qty 2

## 2018-07-24 MED ORDER — FUROSEMIDE 10 MG/ML IJ SOLN
80.0000 mg | Freq: Two times a day (BID) | INTRAMUSCULAR | Status: DC
  Administered 2018-07-24: 80 mg via INTRAVENOUS
  Filled 2018-07-24 (×2): qty 8

## 2018-07-24 MED ORDER — ACETAMINOPHEN 500 MG PO TABS
500.0000 mg | ORAL_TABLET | ORAL | Status: DC | PRN
  Administered 2018-07-24: 500 mg via ORAL
  Filled 2018-07-24: qty 1

## 2018-07-24 NOTE — Telephone Encounter (Signed)
Overnight fellow on call received a call from patient asking what was going on with her hospital direct admit. The admit was apparently canceled in Epic. No notes available to state why. I called bed placement who informed me that pt had a bed as of about noon yesterday but never showed up so it was cancelled at 6am this morning. There were not notes as to whether they were able to reach pt with instructions. I called the patient and she says that she was never contacted. She is stable enough for direct admit, not needing ED at present. She agrees to direct admit and will stay by the phone. I advised Bed Placement to reactivate bed request. I also told them to call me if they are unable to reach pt. Staff are to page me when she arrives.

## 2018-07-24 NOTE — H&P (Signed)
CARDIOLOGY ADMISSION NOTE  Patient ID: Kylie [name] MRN: [id number] DOB/AGE: [birth year] 58 y.o.  Admit date: 07/24/2018 Primary Physician   Kirstie Peri, MD Primary Cardiologist   No primary care provider on file. Chief Complaint    SOB and leg swelling  HPI:   The patient has a history of chronic diastolic CHF withmild pulnonary HTN by echo with PASP multifactorial from diastolic HF, HTN, obesity and lupus. She also hassevere OSA with an AHI of 36/hr with oxygen desaturations as low as 78%. She is on CPAP at10cm H2O.   She saw Dr. Mayford Knife recently and had increased edema and SOB.   She has had her Lasix increased.   Home health went to see her at her home.  Her ReD Vest reading was 43%.  She had Zaroxolyn added but was no better on follow up reading yesterday and she was to have direct admit.     She has decreased hearing and it is slightly difficult to understand her but she says that she has had slowly increased edema and abdominal girth.  She feels tightness in her abdomen and back when she tries to bend over.  She has some orthopnea.  She denies any substernal chest pain.  There is a dry cough but no fever or chills.  She does not report exposure to COVID 19 but she does say that her grandchildren apparently come in and out.     Past Medical History:  Diagnosis Date  . Fibromyalgia   . Hearing loss   . Hypertension   . Obesity (BMI 30-39.9) 04/14/2014  . OSA (obstructive sleep apnea) 02/07/2015   Severe with AHI 36/hr now on CPAP at 10cm H2O  . Pulmonary HTN (HCC) 04/14/2014  . Sinus tachycardia 04/14/2014  . SLE (systemic lupus erythematosus) (HCC)   . Tobacco use     Past Surgical History:  Procedure Laterality Date  . CESAREAN SECTION    . CHOLECYSTECTOMY    . COLONOSCOPY N/A 07/09/2016   Procedure: COLONOSCOPY;  Surgeon: West Bali, MD;  [facility];  Service: Endoscopy;  Laterality: N/A;  9:00am  . RIGHT HEART CATHETERIZATION N/A  05/31/2014   Procedure: RIGHT HEART CATH;  Surgeon: Laurey Morale, MD;  Location: [facility] CATH LAB;  Service: Cardiovascular;  Laterality: N/A;    Allergies  Allergen Reactions  . Ibuprofen Swelling  . Amoxicillin Other (See Comments)    Patient doesn't recall  . Sulfa Antibiotics Other (See Comments)    Patient doesn't recall   No current facility-administered medications on file prior to encounter.    Current Outpatient Medications on File Prior to Encounter  Medication Sig Dispense Refill  . acetaminophen (TYLENOL) 500 MG tablet Take 500 mg by mouth as needed.    . Ascorbic Acid (VITAMIN C PO) Take 1 tablet by mouth daily.    . Calcium Carbonate-Vitamin D (CALCIUM PLUS VITAMIN D PO) Take 1 tablet by mouth daily. Takes with Milk Chocolate    . chlorzoxazone (PARAFON) 500 MG tablet Take 500 mg by mouth 4 (four) times daily as needed for muscle spasms.    . Cholecalciferol (VITAMIN D3 PO) Take 1 tablet by mouth daily.    . furosemide (LASIX) 40 MG tablet Take 1 tablet (40 mg total) by mouth 2 (two) times daily. 180 tablet 3  . hydroxychloroquine (PLAQUENIL) 200 MG tablet Take 600 mg by mouth daily.     Marland Kitchen lisinopril (PRINIVIL,ZESTRIL) 40 MG tablet Take 1 tablet (40 mg  total) by mouth daily. 90 tablet 3  . metolazone (ZAROXOLYN) 2.5 MG tablet Take 1 tablet (2.5 mg total) by mouth daily for 2 doses. 2 tablet 0  . Omega-3 Fatty Acids (FISH OIL PO) Take 1 tablet by mouth daily.    Marland Kitchen. PARoxetine (PAXIL) 40 MG tablet Take 40 mg by mouth every morning.    . potassium chloride SA (K-DUR) 20 MEQ tablet Take 2 tablets (40 mEq total) by mouth 2 (two) times daily for 2 doses. Take with Metolazone 4 tablet 0  . predniSONE (DELTASONE) 10 MG tablet Take 5 mg by mouth daily.  1  . spironolactone (ALDACTONE) 25 MG tablet TAKE (1/2) TABLET DAILY. 45 tablet 0  . traZODone (DESYREL) 50 MG tablet Take 100 mg by mouth at bedtime.     Marland Kitchen. VITAMIN E PO Take 1 tablet by mouth daily.     Social History    Socioeconomic History  . Marital status: Legally Separated    Spouse name: Not on file  . Number of children: 1  . Years of education: 2712  . Highest education level: Not on file  Occupational History  . Occupation: Disabled  Social Needs  . Financial resource strain: Not on file  . Food insecurity:    Worry: Not on file    Inability: Not on file  . Transportation needs:    Medical: Not on file    Non-medical: Not on file  Tobacco Use  . Smoking status: Current Every Day Smoker    Packs/day: 0.25    Years: 5.00    Pack years: 1.25    Types: Cigarettes  . Smokeless tobacco: Never Used  Substance and Sexual Activity  . Alcohol use: No  . Drug use: No  . Sexual activity: Not on file  Lifestyle  . Physical activity:    Days per week: Not on file    Minutes per session: Not on file  . Stress: Not on file  Relationships  . Social connections:    Talks on phone: Not on file    Gets together: Not on file    Attends religious service: Not on file    Active member of club or organization: Not on file    Attends meetings of clubs or organizations: Not on file    Relationship status: Not on file  . Intimate partner violence:    Fear of current or ex partner: Not on file    Emotionally abused: Not on file    Physically abused: Not on file    Forced sexual activity: Not on file  Other Topics Concern  . Not on file  Social History Narrative   Lives alone.  Four grand children and one living child.      Family History  Problem Relation Age of Onset  . Coronary artery disease Father 2479  . Lupus Mother      ROS:  As stated in the HPI and negative for all other systems.  Physical Exam: Blood pressure 109/67, pulse 88, temperature 98.3 F (36.8 C), temperature source Oral, resp. rate 18, height 5\' 5"  (1.651 m), weight 124.8 kg.  GENERAL:  Well appearing HEENT:  Pupils equal round and reactive, fundi not visualized, oral mucosa unremarkable NECK:  No jugular venous  distention, waveform within normal limits, carotid upstroke brisk and symmetric, no bruits, no thyromegaly LYMPHATICS:  No cervical, inguinal adenopathy LUNGS:  Clear to auscultation bilaterally BACK:  No CVA tenderness CHEST:  Unremarkable HEART:  PMI not displaced  or sustained,S1 and S2 within normal limits, no S3, no S4, no clicks, no rubs, no murmurs ABD:  Flat, positive bowel sounds normal in frequency in pitch, no bruits, no rebound, no guarding, no midline pulsatile mass, no hepatomegaly, no splenomegaly EXT:  2 plus pulses throughout, moderate leg edema, no cyanosis no clubbing SKIN:  No rashes no nodules NEURO:  Cranial nerves II through XII grossly intact, motor grossly intact throughout PSYCH:  Cognitively intact, oriented to person place and time  Labs: Lab Results  Component Value Date   BUN 23 07/22/2018   Lab Results  Component Value Date   CREATININE 1.25 (H) 07/22/2018   Lab Results  Component Value Date   NA 129 (L) 07/22/2018   K 4.2 07/22/2018   CL 86 (L) 07/22/2018   CO2 24 07/22/2018   No results found for: TROPONINI Lab Results  Component Value Date   WBC 8.3 05/29/2014   HGB 13.7 05/29/2014   HCT 40.7 05/29/2014   MCV 88.2 05/29/2014   PLT 234.0 05/29/2014   No results found for: CHOL, HDL, LDLCALC, LDLDIRECT, TRIG, CHOLHDL No results found for: ALT, AST, GGT, ALKPHOS, BILITOT    Radiology:  CXR:  Pending  EKG:  Pending  ASSESSMENT AND PLAN:    CHRONIC DIASTOLIC HF:  Admit and repeat an echo.  IV diuresis with 80 IV Lasix.  She will need very close follow up for her Na which is pending today but has been low.    PULMONARY HTN:  Assess as above.   HTN:  BP is actually low.  Follow as we diurese.     HYPONATREMIA:    Free water restrict and follow creatinine.     SignedRollene Rotunda 07/24/2018, 1:43 PM

## 2018-07-25 ENCOUNTER — Inpatient Hospital Stay (HOSPITAL_COMMUNITY): Payer: Medicare Other

## 2018-07-25 DIAGNOSIS — I2721 Secondary pulmonary arterial hypertension: Secondary | ICD-10-CM

## 2018-07-25 DIAGNOSIS — I50812 Chronic right heart failure: Secondary | ICD-10-CM

## 2018-07-25 DIAGNOSIS — N179 Acute kidney failure, unspecified: Secondary | ICD-10-CM

## 2018-07-25 DIAGNOSIS — R0609 Other forms of dyspnea: Secondary | ICD-10-CM

## 2018-07-25 LAB — HIV ANTIBODY (ROUTINE TESTING W REFLEX): HIV Screen 4th Generation wRfx: NONREACTIVE

## 2018-07-25 LAB — BASIC METABOLIC PANEL
Anion gap: 15 (ref 5–15)
BUN: 25 mg/dL — ABNORMAL HIGH (ref 6–20)
CO2: 31 mmol/L (ref 22–32)
Calcium: 9.3 mg/dL (ref 8.9–10.3)
Chloride: 80 mmol/L — ABNORMAL LOW (ref 98–111)
Creatinine, Ser: 1.45 mg/dL — ABNORMAL HIGH (ref 0.44–1.00)
GFR calc Af Amer: 46 mL/min — ABNORMAL LOW (ref >60)
GFR calc non Af Amer: 40 mL/min — ABNORMAL LOW (ref >60)
Glucose, Bld: 87 mg/dL (ref 70–99)
Potassium: 4 mmol/L (ref 3.5–5.1)
Sodium: 126 mmol/L — ABNORMAL LOW (ref 135–145)

## 2018-07-25 LAB — ECHOCARDIOGRAM LIMITED
Height: 65 in
Weight: 4395.2 oz

## 2018-07-25 LAB — SARS CORONAVIRUS 2 BY RT PCR (HOSPITAL ORDER, PERFORMED IN ~~LOC~~ HOSPITAL LAB): SARS Coronavirus 2: NEGATIVE

## 2018-07-25 NOTE — Progress Notes (Signed)
Progress Note  Patient Name: Kylie [name] Date of Encounter: 07/25/2018  Primary Cardiologist: Armanda Magic, MD   Subjective   Her breathing is good. She describes choking, rather than dyspnea, when she lies down. She has some degree of dysphagia and coughs after eating, has "to wash it down" with a drink of water. Denies heartburn. BP is low. No edema. Hyponatremic Kidney function is worsening Worsening renal function Clear CXR BNP is very low (7.5) Echo shows normal systolic and diastolic function, collapsed inferior vena cava  I do not think that she has heart failure.   Inpatient Medications    Scheduled Meds: . cholecalciferol  1,000 Units Oral Daily  . heparin  5,000 Units Subcutaneous Q8H  . hydroxychloroquine  600 mg Oral Daily  . lisinopril  40 mg Oral Daily  . Living Better with Heart Failure Book   Does not apply Once  . metoprolol succinate  12.5 mg Oral Daily  . omega-3 acid ethyl esters  1 g Oral Daily  . PARoxetine  40 mg Oral Daily  . predniSONE  5 mg Oral Q breakfast  . sodium chloride flush  3 mL Intravenous Q12H  . traZODone  100 mg Oral QHS   Continuous Infusions: . sodium chloride     PRN Meds: sodium chloride, acetaminophen, ondansetron (ZOFRAN) IV, sodium chloride flush   Vital Signs    Vitals:   07/24/18 1100 07/24/18 1521 07/25/18 0514  BP: 109/67 128/84   Pulse: 88 84   Resp: 18    Temp: 98.3 F (36.8 C)    TempSrc: Oral    Weight: 124.8 kg  124.6 kg  Height: 5\' 5"  (1.651 m)      Intake/Output Summary (Last 24 hours) at 07/25/2018 1248 Last data filed at 07/25/2018 0900 Gross per 24 hour  Intake 1080 ml  Output 2300 ml  Net -1220 ml   Last 3 Weights 07/25/2018 07/24/2018 07/22/2018  Weight (lbs) 274 lb 11.2 oz 275 lb 3.2 oz 275 lb 9.6 oz  Weight (kg) 124.603 kg 124.83 kg 125.011 kg      Telemetry    NSR - Personally Reviewed  ECG    Not yet performed on this admission - Personally Reviewed the 12/22/2016 ECG which  is normal except for low voltage and PRWP due to obesity   Physical Exam  Morbid obesity limits her exam GEN: No acute distress.   Neck: No JVD Cardiac: RRR, no murmurs, rubs, or gallops.  Respiratory: Clear to auscultation bilaterally. GI: Soft, nontender, non-distended  MS: No edema; No deformity. Neuro:  Nonfocal  Psych: Normal affect   Labs    Chemistry Recent Labs  Lab 07/22/18 1310 07/24/18 1443 07/25/18 0436  NA 129* 123* 126*  K 4.2 4.0 4.0  CL 86* 84* 80*  CO2 24 27 31   GLUCOSE 89 142* 87  BUN 23 21* 25*  CREATININE 1.25* 1.32* 1.45*  CALCIUM 9.9 9.4 9.3  GFRNONAA 48* 44* 40*  GFRAA 55* 51* 46*  ANIONGAP  --  12 15     Hematology Recent Labs  Lab 07/24/18 1443  WBC 6.1  RBC 4.67  HGB 13.9  HCT 40.3  MCV 86.3  MCH 29.8  MCHC 34.5  RDW 11.9  PLT 253    Cardiac EnzymesNo results for input(s): TROPONINI in the last 168 hours. No results for input(s): TROPIPOC in the last 168 hours.   BNP Recent Labs  Lab 07/24/18 1443  BNP 7.5  DDimer No results for input(s): DDIMER in the last 168 hours.   Radiology    Dg Chest 2 View  Result Date: 07/25/2018 CLINICAL DATA:  Short of breath EXAM: CHEST - 2 VIEW COMPARISON:  None. FINDINGS: Cardiac silhouette is enlarged. No effusion, infiltrate or pneumothorax. No acute osseous abnormality. IMPRESSION: Cardiomegaly.  No acute findings. Electronically Signed   By: Genevive BiStewart  Edmunds M.D.   On: 07/25/2018 06:03    Cardiac Studies   ECHO 07/25/2018  1. The left ventricle has normal systolic function, with an ejection fraction of 60-65%. The cavity size was normal. Left ventricular diastolic parameters were normal.  2. The right ventricle has normal systolc function. The cavity was normal. There is no increase in right ventricular wall thickness. Right ventricular systolic pressure could not be assessed.  Note that ECHO 01/12/2017 was interpreted as "Doppler   parameters are consistent with abnormal left  ventricular   relaxation (grade 1 diastolic dysfunction).", but the mitral annulus velocities were excellent at 9  (medial) and 13 (lateral), respectively   PFTs 12/2016 reviewed  Ref Range & Units 7669yr ago 2513yr ago  FVC-Pre L 2.81  2.95   FVC-%Pred-Pre % 80  82   FVC-Post L 2.95  2.95   FVC-%Pred-Post % 84  82   FVC-%Change-Post % 5  0   FEV1-Pre L 2.35  2.44   FEV1-%Pred-Pre % 85  86   FEV1-Post L 2.47  2.48   FEV1-%Pred-Post % 90  88   FEV1-%Change-Post % 5  1      Patient Profile     58 y.o. female with dyspnea and presumed HF (based on REDS Vest reading), that did not improve with outpatient diuretic dose escalation  Assessment & Plan    She is hypovolemic, which explains her hypotension, metabolic alkalosis, worsening renal function and hyponatremia. I do not think that she has diastolic heart failure, based on the review of her previous and her current echo. I do not know how to explain the abnormal REDS vest reading, but everything else points in the opposite direction. Obesity and OSA are clear components of her chronic dyspnea and may cause peripheral edema without left heart failure being present (right now she has no edema). Would explore the possibility that she has GERD related cough and dyspnea, based on her review of systems. Diuretic stopped. Need to monitor overnight for improvement in renal function and Na levels.  For questions or updates, please contact [facility] Please consult [email or web address] for contact info under        Signed, Thurmon FairMihai Viera Okonski, MD  07/25/2018, 12:48 PM

## 2018-07-26 ENCOUNTER — Encounter (HOSPITAL_COMMUNITY): Disposition: A | Discharge status: Home / Self Care | Payer: Self-pay | Source: Ambulatory Visit | Attending: Cardiology

## 2018-07-26 ENCOUNTER — Other Ambulatory Visit (HOSPITAL_COMMUNITY): Payer: Self-pay | Admitting: Cardiology

## 2018-07-26 DIAGNOSIS — I5022 Chronic systolic (congestive) heart failure: Secondary | ICD-10-CM

## 2018-07-26 DIAGNOSIS — R0609 Other forms of dyspnea: Secondary | ICD-10-CM

## 2018-07-26 DIAGNOSIS — E871 Hypo-osmolality and hyponatremia: Secondary | ICD-10-CM

## 2018-07-26 DIAGNOSIS — G4733 Obstructive sleep apnea (adult) (pediatric): Secondary | ICD-10-CM

## 2018-07-26 HISTORY — PX: RIGHT HEART CATH: CATH118263

## 2018-07-26 LAB — POCT I-STAT EG7
Acid-Base Excess: 2 mmol/L (ref 0.0–2.0)
Acid-Base Excess: 3 mmol/L — ABNORMAL HIGH (ref 0.0–2.0)
Acid-Base Excess: 3 mmol/L — ABNORMAL HIGH (ref 0.0–2.0)
Bicarbonate: 28.3 mmol/L — ABNORMAL HIGH (ref 20.0–28.0)
Bicarbonate: 28.8 mmol/L — ABNORMAL HIGH (ref 20.0–28.0)
Bicarbonate: 29.2 mmol/L — ABNORMAL HIGH (ref 20.0–28.0)
Calcium, Ion: 1.11 mmol/L — ABNORMAL LOW (ref 1.15–1.40)
Calcium, Ion: 1.13 mmol/L — ABNORMAL LOW (ref 1.15–1.40)
Calcium, Ion: 1.2 mmol/L (ref 1.15–1.40)
HCT: 35 % — ABNORMAL LOW (ref 36.0–46.0)
HCT: 35 % — ABNORMAL LOW (ref 36.0–46.0)
HCT: 36 % (ref 36.0–46.0)
Hemoglobin: 11.9 g/dL — ABNORMAL LOW (ref 12.0–15.0)
Hemoglobin: 11.9 g/dL — ABNORMAL LOW (ref 12.0–15.0)
Hemoglobin: 12.2 g/dL (ref 12.0–15.0)
O2 Saturation: 74 %
O2 Saturation: 76 %
O2 Saturation: 77 %
Potassium: 3.9 mmol/L (ref 3.5–5.1)
Potassium: 4 mmol/L (ref 3.5–5.1)
Potassium: 4.2 mmol/L (ref 3.5–5.1)
Sodium: 125 mmol/L — ABNORMAL LOW (ref 135–145)
Sodium: 127 mmol/L — ABNORMAL LOW (ref 135–145)
Sodium: 127 mmol/L — ABNORMAL LOW (ref 135–145)
TCO2: 30 mmol/L (ref 22–32)
TCO2: 30 mmol/L (ref 22–32)
TCO2: 31 mmol/L (ref 22–32)
pCO2, Ven: 48.4 mmHg (ref 44.0–60.0)
pCO2, Ven: 49.9 mmHg (ref 44.0–60.0)
pCO2, Ven: 50.4 mmHg (ref 44.0–60.0)
pH, Ven: 7.369 (ref 7.250–7.430)
pH, Ven: 7.37 (ref 7.250–7.430)
pH, Ven: 7.375 (ref 7.250–7.430)
pO2, Ven: 41 mmHg (ref 32.0–45.0)
pO2, Ven: 43 mmHg (ref 32.0–45.0)
pO2, Ven: 43 mmHg (ref 32.0–45.0)

## 2018-07-26 LAB — BASIC METABOLIC PANEL
Anion gap: 9 (ref 5–15)
Anion gap: 12 (ref 5–15)
BUN: 27 mg/dL — ABNORMAL HIGH (ref 6–20)
BUN: 24 mg/dL — ABNORMAL HIGH (ref 6–20)
CO2: 28 mmol/L (ref 22–32)
CO2: 24 mmol/L (ref 22–32)
Calcium: 9.2 mg/dL (ref 8.9–10.3)
Calcium: 8.9 mg/dL (ref 8.9–10.3)
Chloride: 88 mmol/L — ABNORMAL LOW (ref 98–111)
Chloride: 88 mmol/L — ABNORMAL LOW (ref 98–111)
Creatinine, Ser: 1.24 mg/dL — ABNORMAL HIGH (ref 0.44–1.00)
Creatinine, Ser: 1.47 mg/dL — ABNORMAL HIGH (ref 0.44–1.00)
GFR calc Af Amer: 55 mL/min — ABNORMAL LOW (ref >60)
GFR calc Af Amer: 45 mL/min — ABNORMAL LOW (ref >60)
GFR calc non Af Amer: 48 mL/min — ABNORMAL LOW (ref >60)
GFR calc non Af Amer: 39 mL/min — ABNORMAL LOW (ref >60)
Glucose, Bld: 89 mg/dL (ref 70–99)
Glucose, Bld: 117 mg/dL — ABNORMAL HIGH (ref 70–99)
Potassium: 4.1 mmol/L (ref 3.5–5.1)
Potassium: 4 mmol/L (ref 3.5–5.1)
Sodium: 125 mmol/L — ABNORMAL LOW (ref 135–145)
Sodium: 124 mmol/L — ABNORMAL LOW (ref 135–145)

## 2018-07-26 LAB — CBC
HCT: 38.8 % (ref 36.0–46.0)
Hemoglobin: 13.2 g/dL (ref 12.0–15.0)
MCH: 29.8 pg (ref 26.0–34.0)
MCHC: 34 g/dL (ref 30.0–36.0)
MCV: 87.6 fL (ref 80.0–100.0)
Platelets: 214 10*3/uL (ref 150–400)
RBC: 4.43 MIL/uL (ref 3.87–5.11)
RDW: 11.9 % (ref 11.5–15.5)
WBC: 5 10*3/uL (ref 4.0–10.5)
nRBC: 0 % (ref 0.0–0.2)

## 2018-07-26 LAB — URINALYSIS, COMPLETE (UACMP) WITH MICROSCOPIC
Bacteria, UA: NONE SEEN
Bilirubin Urine: NEGATIVE
Glucose, UA: NEGATIVE mg/dL
Hgb urine dipstick: NEGATIVE
Ketones, ur: NEGATIVE mg/dL
Leukocytes,Ua: NEGATIVE
Nitrite: NEGATIVE
Protein, ur: NEGATIVE mg/dL
Specific Gravity, Urine: 1.01 (ref 1.005–1.030)
pH: 6 (ref 5.0–8.0)

## 2018-07-26 LAB — SODIUM, URINE, RANDOM: Sodium, Ur: <10 mmol/L

## 2018-07-26 SURGERY — RIGHT HEART CATH
Anesthesia: LOCAL

## 2018-07-26 MED ORDER — SODIUM CHLORIDE 0.9% FLUSH: 3.0000 mL | INTRAVENOUS | Status: DC | PRN

## 2018-07-26 MED ORDER — LIDOCAINE HCL (PF) 1 % IJ SOLN
INTRAMUSCULAR | Status: AC
  Filled 2018-07-26: qty 30

## 2018-07-26 MED ORDER — SODIUM CHLORIDE 0.9 % IV SOLN: 250.0000 mL | INTRAVENOUS | Status: DC | PRN

## 2018-07-26 MED ORDER — FENTANYL CITRATE (PF) 100 MCG/2ML IJ SOLN
INTRAMUSCULAR | Status: AC
  Filled 2018-07-26: qty 2

## 2018-07-26 MED ORDER — HEPARIN (PORCINE) IN NACL 1000-0.9 UT/500ML-% IV SOLN
INTRAVENOUS | Status: AC
  Filled 2018-07-26: qty 500

## 2018-07-26 MED ORDER — SODIUM CHLORIDE 0.9% FLUSH
3.0000 mL | Freq: Two times a day (BID) | INTRAVENOUS | Status: DC
  Administered 2018-07-27: 3 mL via INTRAVENOUS

## 2018-07-26 MED ORDER — LABETALOL HCL 5 MG/ML IV SOLN: 10.0000 mg | INTRAVENOUS | Status: AC | PRN

## 2018-07-26 MED ORDER — MIDAZOLAM HCL 2 MG/2ML IJ SOLN
INTRAMUSCULAR | Status: AC
  Filled 2018-07-26: qty 2

## 2018-07-26 MED ORDER — MIDAZOLAM HCL 2 MG/2ML IJ SOLN
INTRAMUSCULAR | Status: DC | PRN
  Administered 2018-07-26: 1 mg via INTRAVENOUS

## 2018-07-26 MED ORDER — HYDRALAZINE HCL 20 MG/ML IJ SOLN: 10.0000 mg | INTRAMUSCULAR | Status: AC | PRN

## 2018-07-26 MED ORDER — HEPARIN (PORCINE) IN NACL 1000-0.9 UT/500ML-% IV SOLN
INTRAVENOUS | Status: DC | PRN
  Administered 2018-07-26: 500 mL

## 2018-07-26 MED ORDER — SODIUM CHLORIDE 0.9 % IV SOLN: INTRAVENOUS | Status: DC

## 2018-07-26 MED ORDER — LIDOCAINE HCL (PF) 1 % IJ SOLN
INTRAMUSCULAR | Status: DC | PRN
  Administered 2018-07-26: 2 mL

## 2018-07-26 MED ORDER — SODIUM CHLORIDE 0.9% FLUSH
3.0000 mL | Freq: Two times a day (BID) | INTRAVENOUS | Status: DC
  Administered 2018-07-26: 3 mL via INTRAVENOUS

## 2018-07-26 MED ORDER — ACETAMINOPHEN 325 MG PO TABS: 650.0000 mg | ORAL_TABLET | ORAL | Status: DC | PRN

## 2018-07-26 MED ORDER — ONDANSETRON HCL 4 MG/2ML IJ SOLN: 4.0000 mg | Freq: Four times a day (QID) | INTRAMUSCULAR | Status: DC | PRN

## 2018-07-26 MED ORDER — ASPIRIN 81 MG PO CHEW
81.0000 mg | CHEWABLE_TABLET | ORAL | Status: AC
  Administered 2018-07-26: 81 mg via ORAL
  Filled 2018-07-26: qty 1

## 2018-07-26 MED ORDER — ENOXAPARIN SODIUM 40 MG/0.4ML ~~LOC~~ SOLN
40.0000 mg | SUBCUTANEOUS | Status: DC
  Administered 2018-07-27: 40 mg via SUBCUTANEOUS
  Filled 2018-07-26: qty 0.4

## 2018-07-26 SURGICAL SUPPLY — 9 items
CATH BALLN WEDGE 5F 110CM (CATHETERS) ×1 IMPLANT
COVER DOME SNAP 22 D (MISCELLANEOUS) ×1 IMPLANT
KIT HEART LEFT (KITS) ×2 IMPLANT
KIT MICROPUNCTURE NIT STIFF (SHEATH) ×1 IMPLANT
PACK CARDIAC CATHETERIZATION (CUSTOM PROCEDURE TRAY) ×2 IMPLANT
SHEATH GLIDE SLENDER 4/5FR (SHEATH) ×1 IMPLANT
SHEATH PROBE COVER 6X72 (BAG) ×1 IMPLANT
TRANSDUCER W/STOPCOCK (MISCELLANEOUS) ×2 IMPLANT
TUBING CIL FLEX 10 FLL-RA (TUBING) ×2 IMPLANT

## 2018-07-26 NOTE — H&P (View-Only) (Signed)
Progress Note  Patient Name: Kylie [name] Date of Encounter: 07/26/2018  Primary Cardiologist: Kylie Magic, MD   Subjective   Doing well this AM, no complaints. Reviewed history with her re: her symptoms. Reviewed that her sodium is low, what can cause this, how we work it up.  Inpatient Medications    Scheduled Meds: . cholecalciferol  1,000 Units Oral Daily  . heparin  5,000 Units Subcutaneous Q8H  . hydroxychloroquine  600 mg Oral Daily  . lisinopril  40 mg Oral Daily  . Living Better with Heart Failure Book   Does not apply Once  . metoprolol succinate  12.5 mg Oral Daily  . omega-3 acid ethyl esters  1 g Oral Daily  . PARoxetine  40 mg Oral Daily  . predniSONE  5 mg Oral Q breakfast  . sodium chloride flush  3 mL Intravenous Q12H  . traZODone  100 mg Oral QHS   Continuous Infusions: . sodium chloride     PRN Meds: sodium chloride, acetaminophen, ondansetron (ZOFRAN) IV, sodium chloride flush   Vital Signs    Vitals:   07/25/18 1354 07/25/18 2058 07/26/18 0421 07/26/18 0841  BP: 111/66 94/61 104/79 103/63  Pulse: 70 70 78 70  Resp: 20 18 18    Temp: 98.3 F (36.8 C) 98.3 F (36.8 C) (!) 97.4 F (36.3 C)   TempSrc: Oral  Oral   SpO2: 99% 96% 99%   Weight:   125 kg   Height:        Intake/Output Summary (Last 24 hours) at 07/26/2018 0851 Last data filed at 07/26/2018 0846 Gross per 24 hour  Intake 1400 ml  Output 3350 ml  Net -1950 ml   Last 3 Weights 07/26/2018 07/25/2018 07/24/2018  Weight (lbs) 275 lb 8 oz 274 lb 11.2 oz 275 lb 3.2 oz  Weight (kg) 124.966 kg 124.603 kg 124.83 kg      Telemetry    NSR - Personally Reviewed  ECG    ECG personally reviewed from yesterday, ST with 1st degree AV block   Physical Exam   GEN: No acute distress.  Well nourished Neck: No JVD Cardiac: RRR, no murmurs, rubs, or gallops.  Respiratory: Clear to auscultation bilaterally. GI: Soft, nontender, non-distended  MS: No edema; No deformity. Neuro:   Nonfocal  Psych: Normal affect   Labs    Chemistry Recent Labs  Lab 07/24/18 1443 07/25/18 0436 07/26/18 0657  NA 123* 126* 125*  K 4.0 4.0 4.1  CL 84* 80* 88*  CO2 27 31 28   GLUCOSE 142* 87 89  BUN 21* 25* 27*  CREATININE 1.32* 1.45* 1.24*  CALCIUM 9.4 9.3 9.2  GFRNONAA 44* 40* 48*  GFRAA 51* 46* 55*  ANIONGAP 12 15 9      Hematology Recent Labs  Lab 07/24/18 1443  WBC 6.1  RBC 4.67  HGB 13.9  HCT 40.3  MCV 86.3  MCH 29.8  MCHC 34.5  RDW 11.9  PLT 253    Cardiac EnzymesNo results for input(s): TROPONINI in the last 168 hours. No results for input(s): TROPIPOC in the last 168 hours.   BNP Recent Labs  Lab 07/24/18 1443  BNP 7.5     DDimer No results for input(s): DDIMER in the last 168 hours.   Radiology    Dg Chest 2 View  Result Date: 07/25/2018 CLINICAL DATA:  Short of breath EXAM: CHEST - 2 VIEW COMPARISON:  None. FINDINGS: Cardiac silhouette is enlarged. No effusion, infiltrate or pneumothorax. No  acute osseous abnormality. IMPRESSION: Cardiomegaly.  No acute findings. Electronically Signed   By: Stewart  Edmunds M.D.   On: 07/25/2018 06:03    Cardiac Studies   ECHO 07/25/2018  1. The left ventricle has normal systolic function, with an ejection fraction of 60-65%. The cavity size was normal. Left ventricular diastolic parameters were normal.  2. The right ventricle has normal systolc function. The cavity was normal. There is no increase in right ventricular wall thickness. Right ventricular systolic pressure could not be assessed.  Note that ECHO 01/12/2017 was interpreted as "Doppler   parameters are consistent with abnormal left ventricular   relaxation (grade 1 diastolic dysfunction).", but the mitral annulus velocities were excellent at 9  (medial) and 13 (lateral), respectively   PFTs 12/2016 reviewed  Ref Range & Units 1yr ago 4yr ago  FVC-Pre L 2.81  2.95   FVC-%Pred-Pre % 80  82   FVC-Post L 2.95  2.95   FVC-%Pred-Post % 84  82    FVC-%Change-Post % 5  0   FEV1-Pre L 2.35  2.44   FEV1-%Pred-Pre % 85  86   FEV1-Post L 2.47  2.48   FEV1-%Pred-Post % 90  88   FEV1-%Change-Post % 5  1      Patient Profile     58 y.o. female with dyspnea and presumed HF (based on REDS Vest reading), that did not improve with outpatient diuretic dose escalation. However, appeared euvolemic to hypovolemic while admitted.  Assessment & Plan    Dyspnea, abdominal bloating: presumed HF (see below) -Does have known severe OSA, reports that her breathing is at her baseline -has had some abdominal bloating, improved  Presumed HF: BNP very low (even with obesity as a factor), chest x-ray clear, echo with normal systolic and diastolic parameters -did report increased edema and abdominal girth on presentation, had elevated vest readings -net negative 3 L, but weight stable from admission -reviewed Dr. Hochrein and Dr. Croitoru's notes. I did not see her on admission, but she does not appear volume overloaded today.  Hyponatremia:  Labs 5/7 with Cr 1.25 (prior 0.98) and Na 129 (prior 136) as an outpatient. Labs 5/9 on admission showed Cr 1.32 and Na 123 Cr has trended 1.32 -> 1.45 -> 1.24 Na has trended 123 -> 126 -> 125  Last diuretic use 5/9 at 15:30 (80 IV lasix)  Restricted free water to 1 L, ok for other fluids that is not free water Recheck sodium this evening. Send urine electrolytes for workup Need to see improvement in Na level prior to discharge.  TIME SPENT WITH PATIENT: 25 minutes of direct patient care. More than 50% of that time was spent on coordination of care and counseling regarding results of workup, need to monitor sodium.  [name], MD, PhD [place]  [facility]   For questions or updates, please contact [facility] Please consult [email or web address] for contact info under     Signed, Elouise Divelbiss, MD  07/26/2018, 8:51 AM    

## 2018-07-26 NOTE — Evaluation (Signed)
Physical Therapy Evaluation Patient Details Name: Kylie [name] MRN: [id number] DOB: 09/11/1960 Today's Date: 07/26/2018   History of Present Illness  58 y.o. female with a history of chronic diastolic CHF with mild pulnonary HTN by echo with PASP felt multifactorial from diastolic HF, HTN, obesity and lupus.  She also has severe OSA with an AHI of 36/hr with oxygen desaturations as low as 78%.  She is on CPAP   Clinical Impression  Pt ambulated 250' without an assistive device, no loss of balance, SaO2 94% on room air walking, 2/4 dyspnea (pt reported this is her baseline). She is independent with mobility, no further PT indicated, will sign off.      Follow Up Recommendations No PT follow up    Equipment Recommendations  None recommended by PT    Recommendations for Other Services       Precautions / Restrictions Precautions Precautions: None Precaution Comments: pt denies falls in past 1 year Restrictions Weight Bearing Restrictions: No      Mobility  Bed Mobility Overal bed mobility: Independent                Transfers Overall transfer level: Independent Equipment used: None                Ambulation/Gait Ambulation/Gait assistance: Independent Gait Distance (Feet): 250 Feet Assistive device: None Gait Pattern/deviations: WFL(Within Functional Limits) Gait velocity: WFL   General Gait Details: 2/4 dyspnea, SaO2 94% and HR 80 on room air while walking  Stairs            Wheelchair Mobility    Modified Rankin (Stroke Patients Only)       Balance Overall balance assessment: Independent                                           Pertinent Vitals/Pain Pain Assessment: No/denies pain    Home Living Family/patient expects to be discharged to:: Private residence Living Arrangements: Alone   Type of Home: Apartment Home Access: Stairs to enter   Secretary/administrator of Steps: 1 flight Home Layout: One  level Home Equipment: Other (comment) Additional Comments: CPAP    Prior Function Level of Independence: Independent         Comments: pt drives     Hand Dominance        Extremity/Trunk Assessment   Upper Extremity Assessment Upper Extremity Assessment: Overall WFL for tasks assessed    Lower Extremity Assessment Lower Extremity Assessment: Overall WFL for tasks assessed    Cervical / Trunk Assessment Cervical / Trunk Assessment: Normal  Communication   Communication: No difficulties  Cognition Arousal/Alertness: Awake/alert Behavior During Therapy: WFL for tasks assessed/performed Overall Cognitive Status: Within Functional Limits for tasks assessed                                        General Comments      Exercises     Assessment/Plan    PT Assessment Patent does not need any further PT services  PT Problem List         PT Treatment Interventions      PT Goals (Current goals can be found in the Care Plan section)  Acute Rehab PT Goals PT Goal Formulation: All assessment and education complete, DC  therapy    Frequency     Barriers to discharge        Co-evaluation               AM-PAC PT "6 Clicks" Mobility  Outcome Measure Help needed turning from your back to your side while in a flat bed without using bedrails?: None Help needed moving from lying on your back to sitting on the side of a flat bed without using bedrails?: None Help needed moving to and from a bed to a chair (including a wheelchair)?: None Help needed standing up from a chair using your arms (e.g., wheelchair or bedside chair)?: None Help needed to walk in hospital room?: None Help needed climbing 3-5 steps with a railing? : None 6 Click Score: 24    End of Session Equipment Utilized During Treatment: Gait belt Activity Tolerance: Patient tolerated treatment well Patient left: in chair;with call bell/phone within reach;with nursing/sitter in  room Nurse Communication: Mobility status      Time: [id number] PT Time Calculation (min) (ACUTE ONLY): 12 min   Charges:   PT Evaluation $PT Eval Low Complexity: 1 Low          Tamala SerUhlenberg, Tyus Kallam Kistler PT 07/26/2018  Acute Rehabilitation Services Pager [phone] Office [phone]

## 2018-07-26 NOTE — Interval H&P Note (Signed)
History and Physical Interval Note:  07/26/2018 1:57 PM  Kylie [name]  has presented today for surgery, with the diagnosis of heart failure.  The various methods of treatment have been discussed with the patient and family. After consideration of risks, benefits and other options for treatment, the patient has consented to  Procedure(s): RIGHT HEART CATH (N/A) as a surgical intervention.  The patient's history has been reviewed, patient examined, no change in status, stable for surgery.  I have reviewed the patient's chart and labs.  Questions were answered to the patient's satisfaction.     Jonanthony Nahar

## 2018-07-26 NOTE — TOC Initial Note (Signed)
Transition of Care [facility](TOC) - Initial/Assessment Note    Patient Details  Name: Kylie [name] MRN: [id number] Date of Birth: 04/02/1960  Transition of Care [facility](TOC) CM/SW Contact:    Cherrie Distancehandler, Teyah Rossy L, RN ,MHA,BSN Phone Number: [phone] 07/26/2018, 10:57 AM  Clinical Narrative:                 Patient lives at home alone; she states that she continues to drive, does the cooking and the cleaning; DME - CPAP at home; Primary Physician   [name], [name], MD; has private insurance with [employer] with prescription drug coverage; [facility]; pt states that she has no problem getting her medication; she has scales at home and knows to weigh herself daily and states that she cooks without using salt in her food. No needs identified at this time. CM will continue to follow for progression of care.  Expected Discharge Plan: Home/Self Care Barriers to Discharge: No Barriers Identified   Patient Goals and CMS Choice Patient states their goals for this hospitalization and ongoing recovery are:: to stay at home CMS Medicare.gov Compare Post Acute Care list provided to:: Patient Choice offered to / list presented to : NA  Expected Discharge Plan and Services Expected Discharge Plan: Home/Self Care In-house Referral: NA Discharge Planning Services: CM Consult Post Acute Care Choice: NA Living arrangements for the past 2 months: Single Family Home                 DME Arranged: N/A DME Agency: NA       HH Arranged: NA HH Agency: NA        Prior Living Arrangements/Services Living arrangements for the past 2 months: Single Family Home Lives with:: Self Patient language and need for interpreter reviewed:: No        Need for Family Participation in Patient Care: No (Comment) Care giver support system in place?: Yes (comment)   Criminal Activity/Legal Involvement Pertinent to Current Situation/Hospitalization: No - Comment as needed  Activities of Daily  Living      Permission Sought/Granted Permission sought to share information with : Case Manager Permission granted to share information with : Yes, Verbal Permission Granted  Share Information with NAME: step mother Gaye (? spelling)           Emotional Assessment Appearance:: Developmentally appropriate Attitude/Demeanor/Rapport: Gracious Affect (typically observed): Accepting Orientation: : Oriented to Self, Oriented to  Time, Oriented to Place, Oriented to Situation Alcohol / Substance Use: Not Applicable Psych Involvement: No (comment)  Admission diagnosis:  Heart Failure  Patient Active Problem List   Diagnosis Date Noted  . Hyponatremia 07/26/2018  . CHF (congestive heart failure) (HCC) 07/24/2018  . Encounter for hearing aid fitting of left ear 12/21/2017  . Abscess of axilla 12/21/2017  . Abscess of breast 12/21/2017  . Abscess of skin and subcutaneous tissue 12/21/2017  . Herpes zoster 12/21/2017  . Abscess or cellulitis of gluteal region 12/21/2017  . Acute maxillary sinusitis 12/21/2017  . Acute upper respiratory infection 12/21/2017  . Anxiety and depression 12/21/2017  . Elevated blood pressure 12/21/2017  . Epicondylitis 12/21/2017  . Fatigue 12/21/2017  . Neck pain 12/21/2017  . Backache 12/21/2017  . Insomnia 12/21/2017  . Joint pain 12/21/2017  . Menstrual cramps 12/21/2017  . Nausea 12/21/2017  . Never smoked tobacco 12/21/2017  . Osteopenia 12/21/2017  . Primary ovarian failure 12/21/2017  . Sjogren's syndrome (HCC) 12/21/2017  . Stool bloody 12/21/2017  . Ginette Pitmanhrush  12/21/2017  . Heme + stool 07/01/2016  . OSA (obstructive sleep apnea) 02/07/2015  . Obesity (BMI 30-39.9) 04/14/2014  . Pulmonary HTN (HCC) 04/14/2014  . Sinus tachycardia 04/14/2014  . Chronic diastolic CHF (congestive heart failure) (HCC) 02/01/2014  . Essential hypertension 02/01/2014   PCP:  Kirstie Peri, MD Pharmacy:   [phone] - [place], [place] - [address] [place] [address] Phone: [phone] Fax: [phone]  [facility] [place] [facility] [place] [place] - [address] [place] [address] Phone: [phone] Fax: [phone]  [facility] - [place] - [address] [place] [address] Phone: [phone] Fax: [phone]     Social Determinants of Health (SDOH) Interventions    Readmission Risk Interventions No flowsheet data found.

## 2018-07-26 NOTE — Progress Notes (Signed)
Progress Note  Patient Name: Kylie [name] Date of Encounter: 07/26/2018  Primary Cardiologist: Armanda Magic, MD   Subjective   Doing well this AM, no complaints. Reviewed history with her re: her symptoms. Reviewed that her sodium is low, what can cause this, how we work it up.  Inpatient Medications    Scheduled Meds: . cholecalciferol  1,000 Units Oral Daily  . heparin  5,000 Units Subcutaneous Q8H  . hydroxychloroquine  600 mg Oral Daily  . lisinopril  40 mg Oral Daily  . Living Better with Heart Failure Book   Does not apply Once  . metoprolol succinate  12.5 mg Oral Daily  . omega-3 acid ethyl esters  1 g Oral Daily  . PARoxetine  40 mg Oral Daily  . predniSONE  5 mg Oral Q breakfast  . sodium chloride flush  3 mL Intravenous Q12H  . traZODone  100 mg Oral QHS   Continuous Infusions: . sodium chloride     PRN Meds: sodium chloride, acetaminophen, ondansetron (ZOFRAN) IV, sodium chloride flush   Vital Signs    Vitals:   07/25/18 1354 07/25/18 2058 07/26/18 0421 07/26/18 0841  BP: 111/66 94/61 104/79 103/63  Pulse: 70 70 78 70  Resp: 20 18 18    Temp: 98.3 F (36.8 C) 98.3 F (36.8 C) (!) 97.4 F (36.3 C)   TempSrc: Oral  Oral   SpO2: 99% 96% 99%   Weight:   125 kg   Height:        Intake/Output Summary (Last 24 hours) at 07/26/2018 0851 Last data filed at 07/26/2018 0846 Gross per 24 hour  Intake 1400 ml  Output 3350 ml  Net -1950 ml   Last 3 Weights 07/26/2018 07/25/2018 07/24/2018  Weight (lbs) 275 lb 8 oz 274 lb 11.2 oz 275 lb 3.2 oz  Weight (kg) 124.966 kg 124.603 kg 124.83 kg      Telemetry    NSR - Personally Reviewed  ECG    ECG personally reviewed from yesterday, ST with 1st degree AV block   Physical Exam   GEN: No acute distress.  Well nourished Neck: No JVD Cardiac: RRR, no murmurs, rubs, or gallops.  Respiratory: Clear to auscultation bilaterally. GI: Soft, nontender, non-distended  MS: No edema; No deformity. Neuro:   Nonfocal  Psych: Normal affect   Labs    Chemistry Recent Labs  Lab 07/24/18 1443 07/25/18 0436 07/26/18 0657  NA 123* 126* 125*  K 4.0 4.0 4.1  CL 84* 80* 88*  CO2 27 31 28   GLUCOSE 142* 87 89  BUN 21* 25* 27*  CREATININE 1.32* 1.45* 1.24*  CALCIUM 9.4 9.3 9.2  GFRNONAA 44* 40* 48*  GFRAA 51* 46* 55*  ANIONGAP 12 15 9      Hematology Recent Labs  Lab 07/24/18 1443  WBC 6.1  RBC 4.67  HGB 13.9  HCT 40.3  MCV 86.3  MCH 29.8  MCHC 34.5  RDW 11.9  PLT 253    Cardiac EnzymesNo results for input(s): TROPONINI in the last 168 hours. No results for input(s): TROPIPOC in the last 168 hours.   BNP Recent Labs  Lab 07/24/18 1443  BNP 7.5     DDimer No results for input(s): DDIMER in the last 168 hours.   Radiology    Dg Chest 2 View  Result Date: 07/25/2018 CLINICAL DATA:  Short of breath EXAM: CHEST - 2 VIEW COMPARISON:  None. FINDINGS: Cardiac silhouette is enlarged. No effusion, infiltrate or pneumothorax. No  acute osseous abnormality. IMPRESSION: Cardiomegaly.  No acute findings. Electronically Signed   By: Genevive BiStewart  Edmunds M.D.   On: 07/25/2018 06:03    Cardiac Studies   ECHO 07/25/2018  1. The left ventricle has normal systolic function, with an ejection fraction of 60-65%. The cavity size was normal. Left ventricular diastolic parameters were normal.  2. The right ventricle has normal systolc function. The cavity was normal. There is no increase in right ventricular wall thickness. Right ventricular systolic pressure could not be assessed.  Note that ECHO 01/12/2017 was interpreted as "Doppler   parameters are consistent with abnormal left ventricular   relaxation (grade 1 diastolic dysfunction).", but the mitral annulus velocities were excellent at 9  (medial) and 13 (lateral), respectively   PFTs 12/2016 reviewed  Ref Range & Units 1109yr ago 5364yr ago  FVC-Pre L 2.81  2.95   FVC-%Pred-Pre % 80  82   FVC-Post L 2.95  2.95   FVC-%Pred-Post % 84  82    FVC-%Change-Post % 5  0   FEV1-Pre L 2.35  2.44   FEV1-%Pred-Pre % 85  86   FEV1-Post L 2.47  2.48   FEV1-%Pred-Post % 90  88   FEV1-%Change-Post % 5  1      Patient Profile     58 y.o. female with dyspnea and presumed HF (based on REDS Vest reading), that did not improve with outpatient diuretic dose escalation. However, appeared euvolemic to hypovolemic while admitted.  Assessment & Plan    Dyspnea, abdominal bloating: presumed HF (see below) -Does have known severe OSA, reports that her breathing is at her baseline -has had some abdominal bloating, improved  Presumed HF: BNP very low (even with obesity as a factor), chest x-ray clear, echo with normal systolic and diastolic parameters -did report increased edema and abdominal girth on presentation, had elevated vest readings -net negative 3 L, but weight stable from admission -reviewed Dr. Antoine PocheHochrein and Dr. Erin Hearingroitoru's notes. I did not see her on admission, but she does not appear volume overloaded today.  Hyponatremia:  Labs 5/7 with Cr 1.25 (prior 0.98) and Na 129 (prior 136) as an outpatient. Labs 5/9 on admission showed Cr 1.32 and Na 123 Cr has trended 1.32 -> 1.45 -> 1.24 Na has trended 123 -> 126 -> 125  Last diuretic use 5/9 at 15:30 (80 IV lasix)  Restricted free water to 1 L, ok for other fluids that is not free water Recheck sodium this evening. Send urine electrolytes for workup Need to see improvement in Na level prior to discharge.  TIME SPENT WITH PATIENT: 25 minutes of direct patient care. More than 50% of that time was spent on coordination of care and counseling regarding results of workup, need to monitor sodium.  Jodelle RedBridgette Lumir Demetriou, MD, PhD [facility]  [facility]   For questions or updates, please contact [facility] Please consult [email or web address] for contact info under     Signed, Jodelle RedBridgette Xitlali Kastens, MD  07/26/2018, 8:51 AM

## 2018-07-27 ENCOUNTER — Other Ambulatory Visit: Payer: Self-pay | Admitting: Student

## 2018-07-27 ENCOUNTER — Encounter (HOSPITAL_COMMUNITY): Payer: Self-pay | Admitting: Internal Medicine

## 2018-07-27 DIAGNOSIS — E871 Hypo-osmolality and hyponatremia: Secondary | ICD-10-CM

## 2018-07-27 DIAGNOSIS — N179 Acute kidney failure, unspecified: Secondary | ICD-10-CM

## 2018-07-27 DIAGNOSIS — R0602 Shortness of breath: Secondary | ICD-10-CM

## 2018-07-27 LAB — BASIC METABOLIC PANEL
Anion gap: 17 — ABNORMAL HIGH (ref 5–15)
BUN: 27 mg/dL — ABNORMAL HIGH (ref 6–20)
CO2: 26 mmol/L (ref 22–32)
Calcium: 9 mg/dL (ref 8.9–10.3)
Chloride: 83 mmol/L — ABNORMAL LOW (ref 98–111)
Creatinine, Ser: 1.36 mg/dL — ABNORMAL HIGH (ref 0.44–1.00)
GFR calc Af Amer: 50 mL/min — ABNORMAL LOW (ref >60)
GFR calc non Af Amer: 43 mL/min — ABNORMAL LOW (ref >60)
Glucose, Bld: 83 mg/dL (ref 70–99)
Potassium: 4.2 mmol/L (ref 3.5–5.1)
Sodium: 126 mmol/L — ABNORMAL LOW (ref 135–145)

## 2018-07-27 MED ORDER — HYDROXYCHLOROQUINE SULFATE 200 MG PO TABS: 400.0000 mg | ORAL_TABLET | Freq: Every day | ORAL | Status: DC

## 2018-07-27 MED ORDER — METOPROLOL SUCCINATE ER 25 MG PO TB24: 12.5000 mg | ORAL_TABLET | Freq: Every day | ORAL | 1 refills | Status: DC

## 2018-07-27 MED ORDER — PREDNISONE 5 MG PO TABS: 5.0000 mg | ORAL_TABLET | Freq: Every day | ORAL | Status: DC

## 2018-07-27 MED FILL — METOPROLOL SUCCINATE ER 25: 25 | 30 days supply | Qty: 15 | Fill #0 | Status: TO

## 2018-07-27 MED FILL — Fentanyl Citrate Preservative Free (PF) Inj 100 MCG/2ML: INTRAMUSCULAR | Qty: 2 | Status: AC

## 2018-07-27 NOTE — Discharge Summary (Signed)
Discharge Summary    Patient ID: Kylie [name] MRN: [id number]; DOB: 02/16/1961  Admit date: 07/24/2018 Discharge date: 07/27/2018  Primary Care Provider: [name], [name], MD  Primary Cardiologist: [name], MD Primary Electrophysiologist:  None   Discharge Diagnoses    Principal Problem:   Presumed Diastolic CHF  Active Problems:   Essential hypertension   OSA (obstructive sleep apnea)   Hyponatremia   AKI (acute kidney injury) (HCC)   Allergies Allergies  Allergen Reactions  . Ibuprofen Swelling  . Amoxicillin Other (See Comments)    Patient doesn't recall  . Sulfa Antibiotics Other (See Comments)    Patient doesn't recall    Diagnostic Studies/Procedures    Echocardiogram 07/25/2018: Impressions: 1. The left ventricle has normal systolic function, with an ejection fraction of 60-65%. The cavity size was normal. Left ventricular diastolic parameters were normal.  2. The right ventricle has normal systolc function. The cavity was normal. There is no increase in right ventricular wall thickness. Right ventricular systolic pressure could not be assessed.  3. The aortic root is normal in size and structure. _____________  Right Heart Catheterization 07/26/2018: Findings: RA =  5 RV = 28/9 PA = 28/6 (18) PCW = 9 Fick cardiac output/index = 6.7/2.9 PVR = 1.3 WU Ao sat = 98% PA sat = 76%, 77% SVC sat 74%  Assessment: 1. Normal filling pressures.    History of Present Illness     Kylie [name] is a 58 year old female with a history of chronic diastolic CHF with EF of 55-60% and mild pulmonary hypertension with PASP of 32 mmHg on Echo in 2018, hypertension, obstructive sleep apnea on CPAP, obesity, systemic lupus erythematosus, and fibromyalgia who is followed by [name].   Patient was seen by [name] for a telehealth visit on 07/09/2018 at which time patient she reported more shortness of breath with exertion and more lower extremity edema (especially in her  right lower extremity) than normal. Patient's weight also noted to be up 35 lbs from what it was 1 year prior. Patient has chronic shortness of breath but admitted to being non-compliant with her heart failure medications and had only been taking Lasix once daily rather than twice daily. Patient was instructed to increase her Lasix 40mg  twice daily. Patient seen by [name] via telehealth visit on 07/16/2018 for follow-up at which time she did not think that she had really lost any weight since increasing her Lasix. She reported slight improvement in her lower extremity edema and some continued mild shortness of breath. [name] sent a [facility] Nurse to patient's house to help determine if patient was volume overloaded. Her REDS Vest reading was 43% on 07/16/2018. She was started on Metolazone but nursing notes from 07/22/2018 showed continued REDS Vest reading of 45% and labs revealed hyponatremia (129) and increased serum creatinine (1.25).Therefore, patient was directly [facility] for further evaluation of suspected heart failure.   Patient has decreased hearing and it is slightly difficult to understand her but patient reports that she has had slowly increased edema and abdominal girth. She feels tightness in her abdomen and back when she tries to bend over. She also reports some orthopnea. She denies any substernal chest pain. She does report a dry cough but denies any fevers or chills. She denies any known exposure to COVID-19 but states her grandchildren come in and out.   Hospital Course     Consultants: None  Presumed Diastolic CHF Patient was  admitted on 07/24/2018 for further evaluation of presumed diastolic heart failure as stated above. Patient received one dose of IV Lasix  daily on admission. Echocardiogram showed LVEF of 60-65% with normal diastolic parameters. On 07/25/2018, patient noted to be hypovolemic which was felt to explain her hypotension, metabolic alkalosis  (bicarb 28.3 >> 28.8 >> 29.2), worsening renal function, and hyponatremia. Dr. Royann Shivers and Dr. Cristal Deer both reviewed the Echo images and did not feel like patient actually had diastolic heart failure. Patient underwent right heart catheterization for further evaluation which showed normal filling pressures. Patient tolerated the procedure well. Will discontinue all diuretics at discharge (Lasix, Metolazone, and Spironolactone). Will continue home Lisinopril as well as Toprol XL which was added this admission.  Hyponatremia Patient has been hyponatremic since 07/22/2018 with sodium ranging from 123 to 129. Sodium 126 today, slightly up from 124 yesterday. Urinalysis was negative and urine sodium <10. Hyponatremia felt to be secondary to hypovolemia and over-diuresis. Will discontinue home Lasix, Metolazone, and Spironolactone at discharge. Patient instructed to restrict free water to 1 L per day and drink more fluids that contain electrolytes, such as Gatorade. Will recheck BMET on Thursday 07/29/2018.  AKI Serum creatinine was 1.32 on admission. Creatinine peaked at 1.47 yesterday and is 1.36 today. Felt to be secondary to hypovolemia. All diuretics held as stated above. Will continue home Lisinopril. Will recheck BMET on Thursday 07/29/2018.   Hypertension Patient's BP has actually been soft here with systolic BP in the 90's to 110's. Diuretics held as stated above. Will continue Lisinopril and Toprol-XL.  Patient seen and examined by Dr. Cristal Deer today and determined to be stable for discharge. Outpatient follow-up has been arranged. Medications as below.  Of note, I personally called the patient's outpatient pharmacy to confirm current dose of home Plauqenil (  daily) and Prednisone (  daily). Advised patient to continue to take these medications as previously directed by ordering provider.   Discharge Vitals Blood pressure 97/64, pulse 71, temperature 97.9 F (36.6 C), temperature  source Oral, resp. rate 16, height  (1.651 m), weight 124.1 kg, SpO2 100 %.  Filed Weights   07/25/18 0514 07/26/18 0421 07/27/18 0428  Weight: 124.6 kg 125 kg 124.1 kg    Labs & Radiologic Studies    CBC Recent Labs    07/24/18 1443 07/26/18 0657 07/26/18 1458 07/26/18 1502  WBC 6.1 5.0  --   --   HGB 13.9 13.2 12.2  11.9* 11.9*  HCT 40.3 38.8 36.0  35.0* 35.0*  MCV 86.3 87.6  --   --   PLT 253 214  --   --    Basic Metabolic Panel Recent Labs    16/10/96 1824 07/27/18 0446  NA 124* 126*  K 4.0 4.2  CL 88* 83*  CO2 24 26  GLUCOSE 117* 83  BUN 24* 27*  CREATININE 1.47* 1.36*  CALCIUM 8.9 9.0   Liver Function Tests No results for input(s): AST, ALT, ALKPHOS, BILITOT, PROT, ALBUMIN in the last 72 hours. No results for input(s): LIPASE, AMYLASE in the last 72 hours. Cardiac Enzymes No results for input(s): CKTOTAL, CKMB, CKMBINDEX, TROPONINI in the last 72 hours. BNP Invalid input(s): POCBNP D-Dimer No results for input(s): DDIMER in the last 72 hours. Hemoglobin A1C No results for input(s): HGBA1C in the last 72 hours. Fasting Lipid Panel No results for input(s): CHOL, HDL, LDLCALC, TRIG, CHOLHDL, LDLDIRECT in the last 72 hours. Thyroid Function Tests No results for input(s): TSH, T4TOTAL, T3FREE, THYROIDAB in the last  72 hours.  Invalid input(s): FREET3 _____________  Dg Chest 2 View  Result Date: 07/25/2018 CLINICAL DATA:  Short of breath EXAM: CHEST - 2 VIEW COMPARISON:  None. FINDINGS: Cardiac silhouette is enlarged. No effusion, infiltrate or pneumothorax. No acute osseous abnormality. IMPRESSION: Cardiomegaly.  No acute findings. Electronically Signed   By: Genevive Bi M.D.   On: 07/25/2018 06:03   Disposition   Patient is being discharged home today in good condition.  Follow-up Plans & Appointments    Follow-up Information    Quintella Reichert, MD Follow up.   Specialty:  Cardiology Why:  Please keep the virtual appointment you  already had scheduled with Dr. Mayford Knife on 08/04/18 at 9:00am. Contact information: [address] [place] [address] [phone]        [facility] [address] Office Follow up.   Specialty:  Cardiology Why:  Please come by our office anytime on Thursday 07/29/2018 from 7:30am to 4:00pm for a lab visit so that we can recheck your electrolytes and kidney function. Contact information: [address], [address] [place] [address] [phone]       Kirstie Peri, MD Follow up.   Specialty:  Internal Medicine Why:  Please call your primary care physician's office to schedule a follow-up appointment in 1-2 weeks. Contact information: [address] [place] [address] [phone]          Discharge Instructions    Diet - low sodium heart healthy   Complete by:  As directed    Increase activity slowly   Complete by:  As directed       Discharge Medications   Allergies as of 07/27/2018      Reactions   Ibuprofen Swelling   Amoxicillin Other (See Comments)   Patient doesn't recall   Sulfa Antibiotics Other (See Comments)   Patient doesn't recall      Medication List    STOP taking these medications   furosemide 40 MG tablet Commonly known as:  LASIX   metolazone 2.5 MG tablet Commonly known as:  ZAROXOLYN   potassium chloride SA 20 MEQ tablet Commonly known as:  K-DUR   spironolactone 25 MG tablet Commonly known as:  ALDACTONE     TAKE these medications   acetaminophen 500 MG tablet Commonly known as:  TYLENOL Take 500 mg by mouth as needed for moderate pain.   CALCIUM PLUS VITAMIN D PO Take 1 tablet by mouth daily. Takes with Milk Chocolate   chlorzoxazone 500 MG tablet Commonly known as:  PARAFON Take 500 mg by mouth 4 (four) times daily as needed for muscle spasms.   FISH OIL PO Take 1 tablet by mouth daily.   hydroxychloroquine 200 MG tablet Commonly known as:  PLAQUENIL Take 2 tablets (400 mg total) by mouth  daily. What changed:    how much to take  when to take this   lisinopril 40 MG tablet Commonly known as:  ZESTRIL Take 1 tablet (40 mg total) by mouth daily. What changed:  when to take this   metoprolol succinate 25 MG 24 hr tablet Commonly known as:  TOPROL-XL Take 0.5 tablets (12.5 mg total) by mouth daily. Start taking on:  Jul 28, 2018   PARoxetine 40 MG tablet Commonly known as:  PAXIL Take 40 mg by mouth every evening.   predniSONE 5 MG tablet Commonly known as:  DELTASONE Take 1 tablet (5 mg total) by mouth daily with breakfast. Start taking on:  Jul 28, 2018 What changed:    medication strength  how much to take  when to take this   traZODone 100 MG tablet Commonly known as:  DESYREL Take 100 mg by mouth at bedtime.   VITAMIN C PO Take 1 tablet by mouth daily.   VITAMIN D3 PO Take 1 tablet by mouth daily.   VITAMIN E PO Take 1 tablet by mouth daily.        Acute coronary syndrome (MI, NSTEMI, STEMI, etc) this admission?: No.    Outstanding Labs/Studies   Repeat BMET on Thursday or Friday (07/29/2018 or 07/30/2018).  Duration of Discharge Encounter   Greater than 30 minutes including physician time.  Signed, Corrin Parker, PA-C 07/27/2018, 12:21 PM

## 2018-07-27 NOTE — Progress Notes (Signed)
Ordered BMET to recheck electrolytes given hyponatremia.

## 2018-07-27 NOTE — Discharge Instructions (Signed)
-   The only new medication we have added is Metoprolol Succinate (Toprol-XL) 12.5mg  daily. - Please stop taking all your diuretics for the time being (Lasix, Metolazone, and Spironolactone). - Please take all other medications (Plaquenil, Prednisone, etc.) as previously directed by the medical provider who ordered them. - Please restrict free water (example bottle water) to 1 L per day. OK to drink more fluids that contain electrolytes, such as Gatorade. - Please come to our office on Thursday for a lab visit so that we can recheck your electrolytes. You can come by anytime from 7:30am to 4:00pm. - Please keep follow-up appointment with Dr. Mayford Knife that is scheduled for 08/04/2018. - Please call your primary care physician's office to scheduled a follow-up visit in the next 1-2 weeks.

## 2018-07-28 ENCOUNTER — Other Ambulatory Visit: Payer: Self-pay | Admitting: Physician Assistant

## 2018-07-28 DIAGNOSIS — E871 Hypo-osmolality and hyponatremia: Secondary | ICD-10-CM

## 2018-07-29 ENCOUNTER — Other Ambulatory Visit: Payer: Self-pay

## 2018-07-29 ENCOUNTER — Other Ambulatory Visit: Payer: Medicare Other | Admitting: *Deleted

## 2018-07-29 LAB — BASIC METABOLIC PANEL
BUN/Creatinine Ratio: 22 (ref 9–23)
BUN: 20 mg/dL (ref 6–24)
CO2: 23 mmol/L (ref 20–29)
Calcium: 9.6 mg/dL (ref 8.7–10.2)
Chloride: 92 mmol/L — ABNORMAL LOW (ref 96–106)
Creatinine, Ser: 0.93 mg/dL (ref 0.57–1.00)
GFR calc Af Amer: 78 mL/min/{1.73_m2} (ref >59)
GFR calc non Af Amer: 68 mL/min/{1.73_m2} (ref >59)
Glucose: 74 mg/dL (ref 65–99)
Potassium: 4.8 mmol/L (ref 3.5–5.2)
Sodium: 128 mmol/L — ABNORMAL LOW (ref 134–144)

## 2018-07-30 ENCOUNTER — Other Ambulatory Visit: Payer: Self-pay | Admitting: Student

## 2018-07-30 ENCOUNTER — Telehealth: Payer: Self-pay

## 2018-07-30 DIAGNOSIS — E871 Hypo-osmolality and hyponatremia: Secondary | ICD-10-CM

## 2018-07-30 NOTE — Telephone Encounter (Signed)
-----   Message from Corrin Parker, [place] sent at 07/30/2018 10:44 AM EDT ----- Please notify patient of the results: Her sodium is still low but has improved since discharge and her kidney function is now back to normal which is great. She should continue to follow instructions at discharge which include stopping all diuretics (Lasix, Metolazone, and Spironolactone) and restricting free (plain) water to 1 L per day. She should continue to drink more fluids that contain electrolytes, such as Gatorade.  I would like to recheck her electrolytes on Monday 08/02/2018. I will put in an order for a BMET. Can we get her scheduled for a lab visit on Monday?  Thank you! Callie

## 2018-07-30 NOTE — Telephone Encounter (Signed)
Notes recorded by Sigurd Sos, RN on 07/30/2018 at 11:15 AM EDT The patient has been notified of the result and verbalized understanding. All questions (if any) were answered. Sigurd Sos, RN 07/30/2018 11:15 AM

## 2018-08-02 ENCOUNTER — Other Ambulatory Visit: Payer: Self-pay

## 2018-08-02 ENCOUNTER — Other Ambulatory Visit: Payer: Medicare Other | Admitting: *Deleted

## 2018-08-02 DIAGNOSIS — E871 Hypo-osmolality and hyponatremia: Secondary | ICD-10-CM

## 2018-08-02 LAB — BASIC METABOLIC PANEL
BUN/Creatinine Ratio: 15 (ref 9–23)
BUN: 15 mg/dL (ref 6–24)
CO2: 26 mmol/L (ref 20–29)
Calcium: 9.6 mg/dL (ref 8.7–10.2)
Chloride: 94 mmol/L — ABNORMAL LOW (ref 96–106)
Creatinine, Ser: 1.01 mg/dL — ABNORMAL HIGH (ref 0.57–1.00)
GFR calc Af Amer: 71 mL/min/{1.73_m2} (ref >59)
GFR calc non Af Amer: 62 mL/min/{1.73_m2} (ref >59)
Glucose: 78 mg/dL (ref 65–99)
Potassium: 4.5 mmol/L (ref 3.5–5.2)
Sodium: 131 mmol/L — ABNORMAL LOW (ref 134–144)

## 2018-08-02 NOTE — Progress Notes (Signed)
Home visit on 07/29/18 done on behalf of [facility] to redraw labs and physical assessment.  Kylie [name] AVS from her previous hospital visit had her to go and have labs drawn at the [facility] heart care clinic.  Assessment done and documented on [facility]'s assessment sheet which was faxed to the ordering provider.

## 2018-08-04 ENCOUNTER — Encounter: Payer: Self-pay | Admitting: Cardiology

## 2018-08-04 ENCOUNTER — Telehealth (INDEPENDENT_AMBULATORY_CARE_PROVIDER_SITE_OTHER): Payer: Medicare Other | Admitting: Cardiology

## 2018-08-04 ENCOUNTER — Other Ambulatory Visit: Payer: Self-pay

## 2018-08-04 VITALS — BP 101/58 | HR 63 | Ht 65.0 in | Wt 275.2 lb

## 2018-08-04 DIAGNOSIS — I10 Essential (primary) hypertension: Secondary | ICD-10-CM

## 2018-08-04 DIAGNOSIS — I272 Pulmonary hypertension, unspecified: Secondary | ICD-10-CM

## 2018-08-04 DIAGNOSIS — G4733 Obstructive sleep apnea (adult) (pediatric): Secondary | ICD-10-CM | POA: Diagnosis not present

## 2018-08-04 DIAGNOSIS — R0602 Shortness of breath: Secondary | ICD-10-CM

## 2018-08-04 DIAGNOSIS — R0609 Other forms of dyspnea: Secondary | ICD-10-CM

## 2018-08-04 DIAGNOSIS — Z6841 Body Mass Index (BMI) 40.0 and over, adult: Secondary | ICD-10-CM

## 2018-08-04 DIAGNOSIS — E669 Obesity, unspecified: Secondary | ICD-10-CM

## 2018-08-04 MED ORDER — LISINOPRIL 20 MG PO TABS: 20.0000 mg | ORAL_TABLET | Freq: Every day | ORAL | 3 refills | Status: DC

## 2018-08-04 MED ORDER — METOPROLOL SUCCINATE ER 25 MG PO TB24: 12.5000 mg | ORAL_TABLET | Freq: Every day | ORAL | 3 refills | Status: DC

## 2018-08-04 NOTE — Patient Instructions (Signed)
Medication Instructions:  Decrease: Lisinopril to 20 mg, daily.  If you need a refill on your cardiac medications before your next appointment, please call your pharmacy.   Lab work: None If you have labs (blood work) drawn today and your tests are completely normal, you will receive your results only by: Marland Kitchen MyChart Message (if you have MyChart) OR . A paper copy in the mail If you have any lab test that is abnormal or we need to change your treatment, we will call you to review the results.  Testing/Procedures: Your physician has requested that you have a lexiscan myoview. For further information please visit [email or web address] Please follow instruction sheet, as given.  Follow-Up: On 10/06/18 with Dr. Mayford Knife at 9 am.   Your physician recommends that you schedule a follow-up appointment in: 2 weeks with the PA.

## 2018-08-04 NOTE — Progress Notes (Signed)
Virtual Visit via Video Note   This visit type was conducted due to national recommendations for restrictions regarding the COVID-19 Pandemic (e.g. social distancing) in an effort to limit this patient's exposure and mitigate transmission in our community.  Due to her co-morbid illnesses, this patient is at least at moderate risk for complications without adequate follow up.  This format is felt to be most appropriate for this patient at this time.  All issues noted in this document were discussed and addressed.  A limited physical exam was performed with this format.  Please refer to the patient's chart for her consent to telehealth [facility].   Evaluation Performed:  Follow-up visit  This visit type was conducted due to national recommendations for restrictions regarding the COVID-19 Pandemic (e.g. social distancing).  This format is felt to be most appropriate for this patient at this time.  All issues noted in this document were discussed and addressed.  No physical exam was performed (except for noted visual exam findings with Video Visits).  Please refer to the patient's chart (MyChart message for video visits and phone note for telephone visits) for the patient's consent to telehealth [facility].  Date:  08/12/2018   ID:  Kylie BogaRobin Lynn Konigsberg, DOB 07/13/1960, MRN [id number]  Patient Location:  [facility]  [place]   [place]  PCP:  [name], [name], MD  Cardiologist:  Armanda Magicraci Almendra Loria, MD  Electrophysiologist:  None   Chief Complaint:  SOB, LE edema  History of Present Illness:    Kylie [name] is a 58 y.o. female who presents via audio/video conferencing for a telehealth visit today.    Roy Youngis a [id number].o.femalewith a hx of chronic diastolic CHF withmild pulnonary HTN by echo with PASP 32mmHgfelt multifactorial from diastolic HF, HTN, obesity and lupus. She also hassevere OSA with an AHI of 36/hr with oxygen desaturations as low as 78%. She is on CPAP  at10cm H2O.  I have been following her for several weeks with complaints of increased SOB and increased LE edema but had not been compliant with her lasix and weight has been trending upward..  She has known chronic DOE that is related to obesity and deconditioning.  Her Lasix was increased to 40 mg twice daily.  She did not respond to the elevated Lasix dose with continued weight gain and what she described was lower extremity edema which is only gotten minimally better with the Lasix.  She is continued to have shortness of breath despite higher doses of Lasix.  Due to no change in symptoms with diuretics we had [facility] nurse come out to evaluate her.  Her ReD vest was elevated to 45%. Zaroxolyn was added to her regimen but no significant improvement after 2 days of taking it.  She subsequently admitted to the hospital on 07/24/2018 for diuresis.  On admission she was noted to have moderate lower extremity edema.  She was hyponatremic felt due to heart failure and was givenThe next day she had below-the-knee edema.  X-ray was clear and her BNP was very low at 7.5.  2D echo showed normal LV function and diastolic function.  It was felt that her swelling in her legs and shortness of breath were likely not related to heart failure.  More likely related to obesity as well as OSA.  It was felt that GERD may be contributing to her cough and dyspnea feeling.  She underwent right heart cath showing normal right heart Pressures with an  RA pressure of 5, RV 28 over 9 mmHg, PA 28 over 6 mmHg pulmonary capillary wedge 9 mmHg cardiac output of 6.7.  Her diuretics were stopped.  Her sodium normalized.  She was subsequently discharged home On lisinopril for blood pressure control as well as Toprol.  She is here today for followup and is doing well.  She denies any chest pain or pressure, SOB, DOE, PND, orthopnea, LE edema, dizziness, palpitations or syncope. She is compliant with her meds and is tolerating  meds with no SE.    The patient does not have symptoms concerning for COVID-19 infection (fever, chills, cough, or new shortness of breath).    Prior CV studies:   The following studies were reviewed today:  none  Past Medical History:  Diagnosis Date   Fibromyalgia    Hearing loss    Hypertension    Obesity (BMI 30-39.9) 04/14/2014   OSA (obstructive sleep apnea) 02/07/2015   Severe with AHI 36/hr now on CPAP at 10cm H2O   Pulmonary HTN (HCC) 04/14/2014   Resolved by 2D echo 2020   Sinus tachycardia 04/14/2014   SLE (systemic lupus erythematosus) (HCC)    Tobacco use    Past Surgical History:  Procedure Laterality Date   CESAREAN SECTION     CHOLECYSTECTOMY     COLONOSCOPY N/A 07/09/2016   Procedure: COLONOSCOPY;  Surgeon: West Bali, MD;  Location: [facility];  Service: Endoscopy;  Laterality: N/A;  9:00am   RIGHT HEART CATH N/A 07/26/2018   Procedure: RIGHT HEART CATH;  Surgeon: Dolores Patty, MD;  Location: [facility] INVASIVE CV LAB;  Service: Cardiovascular;  Laterality: N/A;   RIGHT HEART CATHETERIZATION N/A 05/31/2014   Procedure: RIGHT HEART CATH;  Surgeon: Laurey Morale, MD;  Location: [facility] CATH LAB;  Service: Cardiovascular;  Laterality: N/A;     Current Meds  Medication Sig   acetaminophen (TYLENOL) 500 MG tablet Take 500 mg by mouth as needed for moderate pain.    Ascorbic Acid (VITAMIN C PO) Take 1 tablet by mouth daily.   Calcium Carbonate-Vitamin D (CALCIUM PLUS VITAMIN D PO) Take 1 tablet by mouth daily. Takes with Milk Chocolate   chlorzoxazone (PARAFON) 500 MG tablet Take 500 mg by mouth 4 (four) times daily as needed for muscle spasms.   Cholecalciferol (VITAMIN D3 PO) Take 1 tablet by mouth daily.   hydroxychloroquine (PLAQUENIL) 200 MG tablet Take 2 tablets (400 mg total) by mouth daily.   metoprolol succinate (TOPROL-XL) 25 MG 24 hr tablet Take 0.5 tablets (12.5 mg total) by mouth daily.   Omega-3 Fatty Acids (FISH OIL PO) Take  1 tablet by mouth daily.   PARoxetine (PAXIL) 40 MG tablet Take 40 mg by mouth every evening.    predniSONE (DELTASONE) 5 MG tablet Take 1 tablet (5 mg total) by mouth daily with breakfast.   traZODone (DESYREL) 100 MG tablet Take 100 mg by mouth at bedtime.   VITAMIN E PO Take 1 tablet by mouth daily.   [DISCONTINUED] lisinopril (PRINIVIL,ZESTRIL) 40 MG tablet Take 1 tablet (40 mg total) by mouth daily. (Patient taking differently: Take 40 mg by mouth every evening. )   [DISCONTINUED] metoprolol succinate (TOPROL-XL) 25 MG 24 hr tablet Take 0.5 tablets (12.5 mg total) by mouth daily.     Allergies:   Ibuprofen; Amoxicillin; and Sulfa antibiotics   Social History   Tobacco Use   Smoking status: Current Every Day Smoker    Packs/day: 0.25    Years: 5.00  Pack years: 1.25    Types: Cigarettes   Smokeless tobacco: Never Used  Substance Use Topics   Alcohol use: No   Drug use: No     Family Hx: The patient's family history includes Coronary artery disease (age of onset: 90) in her father; Lupus in her mother.  ROS:   Please see the history of present illness.     All other systems reviewed and are negative.   Labs/Other Tests and Data Reviewed:    Recent Labs: 07/24/2018: B Natriuretic Peptide 7.5 07/26/2018: Hemoglobin 11.9; Platelets 214 08/02/2018: BUN 15; Creatinine, Ser 1.01; Potassium 4.5; Sodium 131   Recent Lipid Panel No results found for: CHOL, TRIG, HDL, CHOLHDL, LDLCALC, LDLDIRECT  Wt Readings from Last 3 Encounters:  08/04/18 275 lb 3.2 oz (124.8 kg)  07/27/18 273 lb 11.2 oz (124.1 kg)  07/22/18 275 lb 9.6 oz (125 kg)     Objective:    Vital Signs:  BP (!) 101/58    Pulse 63    Ht 5\' 5"  (1.651 m)    Wt 275 lb 3.2 oz (124.8 kg)    BMI 45.80 kg/m    CONSTITUTIONAL:  Well nourished, well developed female in no acute distress.  EYES: anicteric MOUTH: oral mucosa is pink RESPIRATORY: Normal respiratory effort, symmetric expansion CARDIOVASCULAR:  No peripheral edema SKIN: No rash, lesions or ulcers MUSCULOSKELETAL: no digital cyanosis NEURO: Cranial Nerves II-XII grossly intact, moves all extremities PSYCH: Intact judgement and insight.  A&O x 3, Mood/affect appropriate   ASSESSMENT & PLAN:    1.  Shortness of breath -recent 2D echo showed normal LV function and diastolic function.  Her right heart cath showed normal right heart pressures.  It is felt that her shortness of breath is more related to underlying morbid obesity as well as obstructive sleep apnea and possibly GERD.  Was felt at her last hospitalization that she did not have diastolic dysfunction or diastolic heart failure and all diuretics were stopped.  I have recommended getting a Lexiscan Myoview to rule out ischemia to make sure we are not dealing with underlying CAD that we have not identified.  2.  Pulmonary hypertension -this was noted in the past but recent right heart cath showed normal right heart pressures.  3.  Morbid obesity - I have encouraged her to get into a routine exercise program and cut back on carbs and portions.    4.  Hypertension - blood pressure Is on the soft side.  I will decrease her lisinopril to 20 mg daily and continue Toprol-XL 12.5 mg daily.  5.  OSA - the patient is tolerating PAP therapy well without any problems.  The patient has been using and benefiting from PAP use and will continue to benefit from therapy.  I will get a download from her DME.  6.  COVID-19 Education:The signs and symptoms of COVID-19 were discussed with the patient and how to seek care for testing (follow up with PCP or arrange E-visit).  The importance of social distancing was discussed today.  Patient Risk:   After full review of this patient's clinical status, I feel that they are at least moderate risk at this time.  Time:   Today, I have spent 15 minutes directly with the patient on video discussing medical problems including HTN, obesity, OSA, SOB.  We also  reviewed the symptoms of COVID 19 and the ways to protect against contracting the virus with telehealth technology.  I spent an additional 10 minutes  reviewing patient's chart including 2D echo, right heart cath, labs.  Medication Adjustments/Labs and Tests Ordered: Current medicines are reviewed at length with the patient today.  Concerns regarding medicines are outlined above.  Tests Ordered: Orders Placed This Encounter  Procedures   MYOCARDIAL PERFUSION IMAGING   Medication Changes: Meds ordered this encounter  Medications   lisinopril (ZESTRIL) 20 MG tablet    Sig: Take 1 tablet (20 mg total) by mouth daily.    Dispense:  90 tablet    Refill:  3    Dose decrease, do not fill, patient will call.   metoprolol succinate (TOPROL-XL) 25 MG 24 hr tablet    Sig: Take 0.5 tablets (12.5 mg total) by mouth daily.    Dispense:  45 tablet    Refill:  3    Disposition:  Follow up in 4 week(s)  with PA in 2 months with me.  Signed, Armanda Magic, MD  08/12/2018 3:17 PM    [facility]

## 2018-08-05 ENCOUNTER — Encounter: Payer: Self-pay | Admitting: Cardiology

## 2018-08-05 ENCOUNTER — Telehealth: Payer: Self-pay | Admitting: Cardiology

## 2018-08-05 NOTE — Telephone Encounter (Signed)
Pt needs further clarification on which medications Dr. Mayford Knife changed at her Telehealth visit yesterday.

## 2018-08-05 NOTE — Telephone Encounter (Signed)
I spoke with pt and told her lisinopril was decreased to 20 mg at yesterday's visit. Pt has 40 mg tablets and will split in half .

## 2018-08-12 ENCOUNTER — Encounter: Payer: Self-pay | Admitting: Cardiology

## 2018-08-17 ENCOUNTER — Telehealth (HOSPITAL_COMMUNITY): Payer: Self-pay | Admitting: *Deleted

## 2018-08-17 ENCOUNTER — Telehealth: Payer: Self-pay

## 2018-08-17 NOTE — Telephone Encounter (Signed)
Patient given detailed instructions per Myocardial Perfusion Study Information Sheet for the test on 08/18/18 at 7:45. Patient notified to arrive 15 minutes early and that it is imperative to arrive on time for appointment to keep from having the test rescheduled.  If you need to cancel or reschedule your appointment, please call the office within 24 hours of your appointment. . Patient verbalized understanding.Daneil Dolin

## 2018-08-17 NOTE — Telephone Encounter (Signed)
Virtual Visit Pre-Appointment Phone Call  TELEPHONE CALL NOTE  Steffi Chakita Lagattuta has been deemed a candidate for a follow-up tele-health visit to limit community exposure during the Covid-19 pandemic. I spoke with the patient via phone to ensure availability of phone/video source, confirm preferred email & phone number, and discuss instructions and expectations.  I reminded Lestie Dakin to be prepared with any vital sign and/or heart rhythm information that could potentially be obtained via home monitoring, at the time of her visit. I reminded Brealyn Foerst to expect a phone call prior to her visit.  Patient agrees to consent below.  Lattie Haw, RN 08/17/2018 9:14 AM    FULL LENGTH CONSENT FOR TELE-HEALTH VISIT   I hereby voluntarily request, consent and authorize [facility] and its employed or contracted physicians, physician assistants, nurse practitioners or other licensed health care professionals (the Practitioner), to provide me with telemedicine health care services (the "Services") as deemed necessary by the treating Practitioner. I acknowledge and consent to receive the Services by the Practitioner via telemedicine. I understand that the telemedicine visit will involve communicating with the Practitioner through live audiovisual communication technology and the disclosure of certain medical information by electronic transmission. I acknowledge that I have been given the opportunity to request an in-person assessment or other available alternative prior to the telemedicine visit and am voluntarily participating in the telemedicine visit.  I understand that I have the right to withhold or withdraw my consent to the use of telemedicine in the course of my care at any time, without affecting my right to future care or treatment, and that the Practitioner or I may terminate the telemedicine visit at any time. I understand that I have the right to inspect all information  obtained and/or recorded in the course of the telemedicine visit and may receive copies of available information for a reasonable fee.  I understand that some of the potential risks of receiving the Services via telemedicine include:  Marland Kitchen Delay or interruption in medical evaluation due to technological equipment failure or disruption; . Information transmitted may not be sufficient (e.g. poor resolution of images) to allow for appropriate medical decision making by the Practitioner; and/or  . In rare instances, security protocols could fail, causing a breach of personal health information.  Furthermore, I acknowledge that it is my responsibility to provide information about my medical history, conditions and care that is complete and accurate to the best of my ability. I acknowledge that Practitioner's advice, recommendations, and/or decision may be based on factors not within their control, such as incomplete or inaccurate data provided by me or distortions of diagnostic images or specimens that may result from electronic transmissions. I understand that the practice of medicine is not an exact science and that Practitioner makes no warranties or guarantees regarding treatment outcomes. I acknowledge that I will receive a copy of this consent concurrently upon execution via email to the email address I last provided but may also request a printed copy by calling the office of [facility].    I understand that my insurance will be billed for this visit.   I have read or had this consent read to me. . I understand the contents of this consent, which adequately explains the benefits and risks of the Services being provided via telemedicine.  . I have been provided ample opportunity to ask questions regarding this consent and the Services and have had my questions answered to my satisfaction. Marland Kitchen  I give my informed consent for the services to be provided through the use of telemedicine in my medical care   By participating in this telemedicine visit I agree to the above.

## 2018-08-18 ENCOUNTER — Encounter: Payer: Self-pay | Admitting: Physician Assistant

## 2018-08-18 ENCOUNTER — Telehealth (INDEPENDENT_AMBULATORY_CARE_PROVIDER_SITE_OTHER): Payer: Medicare Other | Admitting: Physician Assistant

## 2018-08-18 ENCOUNTER — Other Ambulatory Visit: Payer: Self-pay

## 2018-08-18 ENCOUNTER — Ambulatory Visit (HOSPITAL_COMMUNITY): Payer: Medicare Other | Attending: Cardiology

## 2018-08-18 VITALS — BP 134/83 | HR 77 | Ht 65.0 in | Wt 283.0 lb

## 2018-08-18 VITALS — Ht 65.0 in | Wt 275.0 lb

## 2018-08-18 DIAGNOSIS — G4733 Obstructive sleep apnea (adult) (pediatric): Secondary | ICD-10-CM

## 2018-08-18 DIAGNOSIS — I10 Essential (primary) hypertension: Secondary | ICD-10-CM | POA: Insufficient documentation

## 2018-08-18 DIAGNOSIS — R600 Localized edema: Secondary | ICD-10-CM

## 2018-08-18 DIAGNOSIS — Z9989 Dependence on other enabling machines and devices: Secondary | ICD-10-CM

## 2018-08-18 DIAGNOSIS — R0609 Other forms of dyspnea: Secondary | ICD-10-CM | POA: Insufficient documentation

## 2018-08-18 DIAGNOSIS — R0600 Dyspnea, unspecified: Secondary | ICD-10-CM | POA: Diagnosis not present

## 2018-08-18 LAB — MYOCARDIAL PERFUSION IMAGING
LV dias vol: 93 mL (ref 46–106)
LV sys vol: 30 mL
Peak HR: 93 {beats}/min
Rest HR: 66 {beats}/min
SDS: 0
SRS: 0
SSS: 0
TID: 0.92

## 2018-08-18 MED ORDER — TECHNETIUM TC 99M TETROFOSMIN IV KIT
31.8000 | PACK | Freq: Once | INTRAVENOUS | Status: AC | PRN
  Administered 2018-08-18: 31.8 via INTRAVENOUS
  Filled 2018-08-18: qty 32

## 2018-08-18 MED ORDER — TECHNETIUM TC 99M TETROFOSMIN IV KIT
10.5000 | PACK | Freq: Once | INTRAVENOUS | Status: AC | PRN
  Administered 2018-08-18: 10.5 via INTRAVENOUS
  Filled 2018-08-18: qty 11

## 2018-08-18 MED ORDER — REGADENOSON 0.4 MG/5ML IV SOLN
0.4000 mg | Freq: Once | INTRAVENOUS | Status: AC
  Administered 2018-08-18: 0.4 mg via INTRAVENOUS

## 2018-08-18 NOTE — Progress Notes (Signed)
Virtual Visit via Video Note   This visit type was conducted due to national recommendations for restrictions regarding the COVID-19 Pandemic (e.g. social distancing) in an effort to limit this patient's exposure and mitigate transmission in our community.  Due to her co-morbid illnesses, this patient is at least at moderate risk for complications without adequate follow up.  This format is felt to be most appropriate for this patient at this time.  All issues noted in this document were discussed and addressed.  A limited physical exam was performed with this format.  Please refer to the patient's chart for her consent to telehealth for [facility].   Date:  08/18/2018   ID:  Kylie [name], DOB January 20, 1961, MRN [id number]  Patient Location: Home Provider Location: Home  PCP:  Kirstie Peri, MD  Cardiologist:  Armanda Magic, MD  Electrophysiologist:  None   Evaluation Performed:  Follow-Up Visit  Chief Complaint:  Post hospital f/u  History of Present Illness:    Kylie [name] is a 58 y.o. female with history of chronic diastolic CHF with mild pulmonary hypertension on echo hypertension, obesity, lupus, severe OSA on CPAP.  Patient had been seeing Dr. Mayford Knife for worsening dyspnea on exertion edema weight gain but did not improve with Lasix.  ReD vest was elevated at 45% and Zaroxolyn was added with no improvement.  Admitted to the hospital and found to be hyponatremic.  Chest x-ray was clear and BNP was low at 7.5.  2D echo showed normal LV function and diastolic function.  Swelling and shortness of breath were felt more related to her obesity and OSA.  Heart cath showed normal right heart pressures and her diuretics were stopped.  Her sodium normalized.  Patient had Lexiscan Myoview today 08/18/2018 but not resulted yet-EF 68%.  Patient still complains of leg pain-down in am. Can't wear compression stocking as they hurt. Trying to keep her legs propped up. Trying to eat low salt.   The patient does not have symptoms concerning for COVID-19 infection (fever, chills, cough, or new shortness of breath).    Past Medical History:  Diagnosis Date  . Fibromyalgia   . Hearing loss   . Hypertension   . Obesity (BMI 30-39.9) 04/14/2014  . OSA (obstructive sleep apnea) 02/07/2015   Severe with AHI 36/hr now on CPAP at 10cm H2O  . Pulmonary HTN (HCC) 04/14/2014   Resolved by 2D echo 2020  . Sinus tachycardia 04/14/2014  . SLE (systemic lupus erythematosus) (HCC)   . Tobacco use    Past Surgical History:  Procedure Laterality Date  . CESAREAN SECTION    . CHOLECYSTECTOMY    . COLONOSCOPY N/A 07/09/2016   Procedure: COLONOSCOPY;  Surgeon: West Bali, MD;  [facility];  Service: Endoscopy;  Laterality: N/A;  9:00am  . RIGHT HEART CATH N/A 07/26/2018   Procedure: RIGHT HEART CATH;  Surgeon: Dolores Patty, MD;  [facility]: [facility] INVASIVE CV LAB;  Service: Cardiovascular;  Laterality: N/A;  . RIGHT HEART CATHETERIZATION N/A 05/31/2014   Procedure: RIGHT HEART CATH;  Surgeon: Laurey Morale, MD;  Location: [facility] CATH LAB;  Service: Cardiovascular;  Laterality: N/A;     Current Meds  Medication Sig  . acetaminophen (TYLENOL) 500 MG tablet Take 500 mg by mouth as needed for moderate pain.   . Ascorbic Acid (VITAMIN C PO) Take 1 tablet by mouth daily.  . Calcium Carbonate-Vitamin D (CALCIUM PLUS VITAMIN D PO) Take 1 tablet by mouth daily.  Takes with Milk Chocolate  . chlorzoxazone (PARAFON) 500 MG tablet Take 500 mg by mouth 4 (four) times daily as needed for muscle spasms.  . Cholecalciferol (VITAMIN D3 PO) Take 1 tablet by mouth daily.  . hydroxychloroquine (PLAQUENIL) 200 MG tablet Take 400 mg by mouth daily.  Marland Kitchen. lisinopril (ZESTRIL) 40 MG tablet Take 20 mg by mouth daily.  . metolazone (ZAROXOLYN) 2.5 MG tablet Take 0.5 tablets by mouth daily.  . metoprolol succinate (TOPROL-XL) 25 MG 24 hr tablet Take 0.5 tablets (12.5 mg total) by mouth daily.  . Omega-3 Fatty  Acids (FISH OIL PO) Take 1 tablet by mouth daily.  Marland Kitchen. PARoxetine (PAXIL) 40 MG tablet Take 40 mg by mouth every evening.   . predniSONE (DELTASONE) 10 MG tablet Take 5 mg by mouth daily.  . traZODone (DESYREL) 100 MG tablet Take 100 mg by mouth at bedtime.  Marland Kitchen. VITAMIN E PO Take 1 tablet by mouth daily.  . [DISCONTINUED] hydroxychloroquine (PLAQUENIL) 200 MG tablet Take 2 tablets (400 mg total) by mouth daily.  . [DISCONTINUED] lisinopril (ZESTRIL) 20 MG tablet Take 1 tablet (20 mg total) by mouth daily.  . [DISCONTINUED] predniSONE (DELTASONE) 5 MG tablet Take 1 tablet (5 mg total) by mouth daily with breakfast.     Allergies:   Ibuprofen; Amoxicillin; and Sulfa antibiotics   Social History   Tobacco Use  . Smoking status: Current Every Day Smoker    Packs/day: 0.25    Years: 5.00    Pack years: 1.25    Types: Cigarettes  . Smokeless tobacco: Never Used  Substance Use Topics  . Alcohol use: No  . Drug use: No     Family Hx: The patient's family history includes Coronary artery disease (age of onset: 10079) in her father; Lupus in her mother.  ROS:   Please see the history of present illness.      All other systems reviewed and are negative.   Prior CV studies:   The following studies were reviewed today:  RHC 5/11/20Findings:   RA =  5 RV = 28/9 PA = 28/6 (18) PCW = 9 Fick cardiac output/index = 6.7/2.9 PVR = 1.3 WU Ao sat = 98% PA sat = 76%, 77% SVC sat 74%   Assessment:   1. Normal filling pressures.    Arvilla Meresaniel Bensimhon, MD  3:17 PM  Echo 5/10/20IMPRESSIONS      1. The left ventricle has normal systolic function, with an ejection fraction of 60-65%. The cavity size was normal. Left ventricular diastolic parameters were normal.  2. The right ventricle has normal systolc function. The cavity was normal. There is no increase in right ventricular wall thickness. Right ventricular systolic pressure could not be assessed.  3. The aortic root is normal in size and  structure.   FINDINGS  Left Ventricle: The left ventricle has normal systolic function, with an ejection fraction of 60-65%. The cavity size was normal. There is no increase in left ventricular wall thickness. Left ventricular diastolic parameters were normal. Normal left  ventricular filling pressures.     Right Ventricle: The right ventricle has normal systolic function. The cavity was normal. There is no increase in right ventricular wall thickness. Right ventricular systolic pressure could not be assessed.   Left Atrium: Left atrial size was normal in size.   Right Atrium: Right atrial size was normal in size. Right atrial pressure is estimated at 10 mmHg.   Interatrial Septum: No atrial level shunt detected by color flow  Doppler.   Pericardium: There is no evidence of pericardial effusion.   Mitral Valve: The mitral valve is normal in structure. Mitral valve regurgitation is not visualized by color flow Doppler.   Tricuspid Valve: The tricuspid valve was normal in structure. Tricuspid valve regurgitation was not visualized by color flow Doppler.   Aortic Valve: The aortic valve is normal in structure. Aortic valve regurgitation was not visualized by color flow Doppler.   Pulmonic Valve: The pulmonic valve was not well visualized.   Aorta: The aortic root is normal in size and structure.   Lexiscan Myoview 08/18/2018  Labs/Other Tests and Data Reviewed:    EKG:  No ECG reviewed.  Recent Labs: 07/24/2018: B Natriuretic Peptide 7.5 07/26/2018: Hemoglobin 11.9; Platelets 214 08/02/2018: BUN 15; Creatinine, Ser 1.01; Potassium 4.5; Sodium 131   Recent Lipid Panel No results found for: CHOL, TRIG, HDL, CHOLHDL, LDLCALC, LDLDIRECT  Wt Readings from Last 3 Encounters:  08/18/18 283 lb (128.4 kg)  08/18/18 275 lb (124.7 kg)  08/04/18 275 lb 3.2 oz (124.8 kg)     Objective:    Vital Signs:  BP 134/83   Pulse 77   Ht 5\' 5"  (1.651 m)   Wt 283 lb (128.4 kg)   BMI 47.09 kg/m     VITAL SIGNS:  reviewed GEN:  no acute distress RESPIRATORY:  normal respiratory effort, symmetric expansion CARDIOVASCULAR:  lower extremity edema noted mild  ASSESSMENT & PLAN:    1. Shortness of breath/lower extremity edema felt secondary to obesity, OSA and possibly GERD.  Recent hospitalization right heart cath with normal right heart pressures, 2D echo with normal systolic and diastolic function.  Her diuretics were stopped.  Lexiscan Myoview 08/18/2018-pending results 2. Morbid obesity weight loss program recommended. She lives in [place] and will look into weight watchers 3. Essential hypertension blood pressures have been running low so lisinopril was decreased to 20 mg daily by Dr. Mayford Knife last office visit. BP stable today 4. OSA on CPAP followed by Dr. Mayford Knife  COVID-19 Education: The signs and symptoms of COVID-19 were discussed with the patient and how to seek care for testing (follow up with PCP or arrange E-visit).   The importance of social distancing was discussed today.  Time:   Today, I have spent with the patient with telehealth technology discussing the above problems.     Medication Adjustments/Labs and Tests Ordered: Current medicines are reviewed at length with the patient today.  Concerns regarding medicines are outlined above.   Tests Ordered: No orders of the defined types were placed in this encounter.   Medication Changes: No orders of the defined types were placed in this encounter.   Disposition:  Follow up in 1 month(s) Dr. Mayford Knife  Signed, Jacolyn Reedy, PA-C  08/18/2018 2:01 PM    [facility]

## 2018-08-18 NOTE — Patient Instructions (Signed)
Medication Instructions:  Your physician recommends that you continue on your current medications as directed. Please refer to the Current Medication list given to you today.  If you need a refill on your cardiac medications before your next appointment, please call your pharmacy.   Lab work: None Ordered  If you have labs (blood work) drawn today and your tests are completely normal, you will receive your results only by: Marland Kitchen MyChart Message (if you have MyChart) OR . A paper copy in the mail If you have any lab test that is abnormal or we need to change your treatment, we will call you to review the results.  Testing/Procedures: None ordered  Follow-Up: Keep appointment with Dr. Mayford Knife on 10/06/18 at 9:00 AM  Any Other Special Instructions Will Be Listed Below (If Applicable).  Weight Watchers Program has been recommended to assist with weight loss  Limit the amount of salt in your diet

## 2018-08-19 ENCOUNTER — Encounter (HOSPITAL_COMMUNITY): Payer: Self-pay | Admitting: Internal Medicine

## 2018-08-24 ENCOUNTER — Other Ambulatory Visit (HOSPITAL_COMMUNITY): Payer: Self-pay | Admitting: Internal Medicine

## 2018-08-24 NOTE — Telephone Encounter (Signed)
Pt's pharmacy requesting a refill on spirolactone 25 mg tablet. This pt sees Dr. Aundra Dubin and this medication was taken off of pt's medication list. Please address

## 2018-08-30 ENCOUNTER — Telehealth: Payer: Self-pay | Admitting: Cardiology

## 2018-08-30 NOTE — Telephone Encounter (Signed)
New message:    Patient calling stating that she needs a new machine to help her breath. Please call patient back.

## 2018-08-30 NOTE — Telephone Encounter (Signed)
Reached out to patient and she is taking her cpap to [place] on Monday 6/21 to see if it can be fixed or if she qualifies for a new one because hers can not be fixed. Her cpap is 58 years old and she has 1 more year to go qualify for the 5 year mark. Pt is agreeable to treatment.

## 2018-09-06 ENCOUNTER — Other Ambulatory Visit (HOSPITAL_COMMUNITY): Payer: Self-pay

## 2018-10-05 NOTE — Progress Notes (Signed)
Virtual Visit via Video Note   This visit type was conducted due to national recommendations for restrictions regarding the COVID-19 Pandemic (e.g. social distancing) in an effort to limit this patient's exposure and mitigate transmission in our community.  Due to her co-morbid illnesses, this patient is at least at moderate risk for complications without adequate follow up.  This format is felt to be most appropriate for this patient at this time.  All issues noted in this document were discussed and addressed.  A limited physical exam was performed with this format.  Please refer to the patient's chart for her consent to telehealth for [facility].   Evaluation Performed:  Follow-up visit  This visit type was conducted due to national recommendations for restrictions regarding the COVID-19 Pandemic (e.g. social distancing).  This format is felt to be most appropriate for this patient at this time.  All issues noted in this document were discussed and addressed.  No physical exam was performed (except for noted visual exam findings with Video Visits).  Please refer to the patient's chart (MyChart message for video visits and phone note for telephone visits) for the patient's consent to telehealth for [facility].  Date:  10/06/2018   ID:  Kylie [name], DOB 10/29/1960, MRN [id number]  Patient Location:  Home  Provider location:   [place]  PCP:  [name], [name], MD  Cardiologist:  Armanda Magicraci Virgilio Broadhead, MD  Electrophysiologist:  None   Chief Complaint:  CHF, pulmonary HTN, HTN, lupus, OSA  History of Present Illness:    Kylie [name] is a 58 y.o. female who presents via audio/video conferencing for a telehealth visit today.    THis is a 58yo obese WF with a hx of Chronic diastolic CHF, pulmonary HTN, obesity lupus, OSA on PAP who has been having problems with DOE and LE edema over the past few months.  It did not respond to diuretics and she underwent 2D echo showing normal LVF with  normal BNP.  It was felt that her SOB and LE edema were related to obesity and OSA.  Heart cath showed normal right heart pressures.  Her diuretics were stopped.  Lexiscan myoview showed no ischemia.    She is here today for followup and is doing well.  She denies any chest pain or pressure, SOB, DOE, PND, orthopnea, dizziness, palpitations or syncope.  SHe has chronic LE edema related to obesity. She is compliant with her meds and is tolerating meds with no SE.  She is doing well with her CPAP device and thinks that she has gotten used to it.  She tolerates the mask and feels the pressure is adequate.  Since going on CPAP she feels rested in the am and has no significant daytime sleepiness.  She denies any significant mouth or nasal dryness or nasal congestion.  She does not think that he snores.    The patient does not have symptoms concerning for COVID-19 infection (fever, chills, cough, or new shortness of breath).   Prior CV studies:   The following studies were reviewed today:  Heart cath  Past Medical History:  Diagnosis Date  . Fibromyalgia   . Hearing loss   . Hypertension   . Obesity (BMI 30-39.9) 04/14/2014  . OSA (obstructive sleep apnea) 02/07/2015   Severe with AHI 36/hr now on CPAP at 10cm H2O  . Pulmonary HTN (HCC) 04/14/2014   Resolved by 2D echo 2020  . Sinus tachycardia 04/14/2014  . SLE (systemic lupus erythematosus) (HCC)   .  Tobacco use    Past Surgical History:  Procedure Laterality Date  . CESAREAN SECTION    . CHOLECYSTECTOMY    . COLONOSCOPY N/A 07/09/2016   Procedure: COLONOSCOPY;  Surgeon: [name], MD;  [facility];  Service: Endoscopy;  Laterality: N/A;  9:00am  . RIGHT HEART CATH N/A 07/26/2018   Procedure: RIGHT HEART CATH;  Surgeon: [name], [name], MD;  Location: [facility] INVASIVE CV LAB;  Service: Cardiovascular;  Laterality: N/A;  . RIGHT HEART CATHETERIZATION N/A 05/31/2014   Procedure: RIGHT HEART CATH;  Surgeon: [name], MD;   Location: [facility] CATH LAB;  Service: Cardiovascular;  Laterality: N/A;     Current Meds  Medication Sig  . acetaminophen (TYLENOL) 500 MG tablet Take 500 mg by mouth as needed for moderate pain.   . Ascorbic Acid (VITAMIN C PO) Take 1 tablet by mouth daily.  . Calcium Carbonate-Vitamin D (CALCIUM PLUS VITAMIN D PO) Take 1 tablet by mouth daily. Takes with Milk Chocolate  . chlorzoxazone (PARAFON) 500 MG tablet Take 500 mg by mouth 4 (four) times daily as needed for muscle spasms.  . Cholecalciferol (VITAMIN D3 PO) Take 1 tablet by mouth daily.  . hydroxychloroquine (PLAQUENIL) 200 MG tablet Take 400 mg by mouth daily.  Marland Kitchen. lisinopril (ZESTRIL) 40 MG tablet Take 20 mg by mouth daily.  . metoprolol succinate (TOPROL-XL) 25 MG 24 hr tablet Take 0.5 tablets (12.5 mg total) by mouth daily.  . Omega-3 Fatty Acids (FISH OIL PO) Take 1 tablet by mouth daily.  Marland Kitchen. PARoxetine (PAXIL) 40 MG tablet Take 40 mg by mouth every evening.   . predniSONE (DELTASONE) 10 MG tablet Take 5 mg by mouth daily.  . traZODone (DESYREL) 100 MG tablet Take 100 mg by mouth at bedtime.  Marland Kitchen. VITAMIN E PO Take 1 tablet by mouth daily.     Allergies:   Ibuprofen, Amoxicillin, and Sulfa antibiotics   Social History   Tobacco Use  . Smoking status: Current Every Day Smoker    Packs/day: 0.25    Years: 5.00    Pack years: 1.25    Types: Cigarettes  . Smokeless tobacco: Never Used  Substance Use Topics  . Alcohol use: No  . Drug use: No     Family Hx: The patient's family history includes Coronary artery disease (age of onset: 5279) in her father; Lupus in her mother.  ROS:   Please see the history of present illness.     All other systems reviewed and are negative.   Labs/Other Tests and Data Reviewed:    Recent Labs: 07/24/2018: B Natriuretic Peptide 7.5 07/26/2018: Hemoglobin 11.9; Platelets 214 08/02/2018: BUN 15; Creatinine, Ser 1.01; Potassium 4.5; Sodium 131   Recent Lipid Panel No results found for: CHOL,  TRIG, HDL, CHOLHDL, LDLCALC, LDLDIRECT  Wt Readings from Last 3 Encounters:  10/06/18 258 lb (117 kg)  08/18/18 283 lb (128.4 kg)  08/18/18 275 lb (124.7 kg)     Objective:    Vital Signs:  BP (!) 145/97   Pulse 88   Wt 258 lb (117 kg)   BMI 42.93 kg/m    CONSTITUTIONAL:  Well nourished, well developed female in no acute distress.  EYES: anicteric MOUTH: oral mucosa is pink RESPIRATORY: Normal respiratory effort, symmetric expansion CARDIOVASCULAR: No peripheral edema SKIN: No rash, lesions or ulcers MUSCULOSKELETAL: no digital cyanosis NEURO: Cranial Nerves II-XII grossly intact, moves all extremities PSYCH: Intact judgement and insight.  A&O x 3, Mood/affect appropriate  ASSESSMENT & PLAN:    1.  Shortness of breath  - 2D echo showed normal LV function and diastolic function.   -RHC showed normal right heart pressures.  - It is felt that her shortness of breath is more related to underlying morbid obesity as well as obstructive sleep apnea and possibly GERD.  -It was felt at her last hospitalization that she did not have diastolic dysfunction or diastolic heart failure and all diuretics were stopped.  - Lexiscan Myoview showed no ischemia.  2.  Pulmonary hypertension  -this was noted in the past on echo but recent right heart cath showed normal right heart pressures.  3.  Morbid obesity  - continued to encouraged her to get into a routine exercise program and cut back on carbs and portions.   -she knows that she needs to lose weight  4.  Hypertension  -BP is borderline controlled at home -continue Toprol XL 12.5mg  daily  -increase lisinopril to 30mg  daily -check BMET in 1 week -check Bp daily at lunch and call with results in 1 week  5.  OSA - the patient is tolerating PAP therapy well without any problems. The patient has been using and benefiting from PAP use and will continue to benefit from therapy. I will get a download on his device.  COVID-19  Education: The signs and symptoms of COVID-19 were discussed with the patient and how to seek care for testing (follow up with PCP or arrange E-visit).  The importance of social distancing was discussed today.  Patient Risk:   After full review of this patient's clinical status, I feel that they are at least moderate risk at this time.  Time:   Today, I have spent 20 minutes on telemedicine discussing medical problems including SOB, OSA, HTN.  We also reviewed the symptoms of COVID 19 and the ways to protect against contracting the virus with telehealth technology.  I spent an additional 5 minutes minutes reviewing patient's chart including stress test.  Medication Adjustments/Labs and Tests Ordered: Current medicines are reviewed at length with the patient today.  Concerns regarding medicines are outlined above.  Tests Ordered: No orders of the defined types were placed in this encounter.  Medication Changes: No orders of the defined types were placed in this encounter.   Disposition:  Follow up in 4 weeks with PA and with me in 1 year  Signed, Fransico Him, MD  10/06/2018 8:57 AM    [place]

## 2018-10-06 ENCOUNTER — Other Ambulatory Visit: Payer: Self-pay

## 2018-10-06 ENCOUNTER — Telehealth (INDEPENDENT_AMBULATORY_CARE_PROVIDER_SITE_OTHER): Payer: Medicare Other | Admitting: Cardiology

## 2018-10-06 ENCOUNTER — Encounter: Payer: Self-pay | Admitting: Cardiology

## 2018-10-06 VITALS — BP 145/97 | HR 88 | Wt 258.0 lb

## 2018-10-06 DIAGNOSIS — R0602 Shortness of breath: Secondary | ICD-10-CM

## 2018-10-06 DIAGNOSIS — I10 Essential (primary) hypertension: Secondary | ICD-10-CM

## 2018-10-06 DIAGNOSIS — E669 Obesity, unspecified: Secondary | ICD-10-CM | POA: Diagnosis not present

## 2018-10-06 DIAGNOSIS — G4733 Obstructive sleep apnea (adult) (pediatric): Secondary | ICD-10-CM

## 2018-10-06 MED ORDER — LISINOPRIL 30 MG PO TABS: 30.0000 mg | ORAL_TABLET | Freq: Every day | ORAL | 3 refills | Status: DC

## 2018-10-06 NOTE — Patient Instructions (Addendum)
Called pt and discussed the following medication changes and follow up appointments. Pt had some confusion as to how, when she should start her new medication and discontinue her old medication.  I contacted pt's pharmacy to clarify pt's new medication change so pt does not take her new dose lisinopril in addition to her current lisinopril supply at home. Pts pharmacist will clarify with pt how she is to take her new medication and discontinue her 20mg  (half 40mg  tablet) lisinopril. Pt was instructed to begin her new medication tomorrow since she has already taken her lisinopril today.  Medication Instructions:   Increase your lisinopril to 30mg  tablet, once daily.   Labwork: Pt will have BMP drawn at Dr. Trena Platt office next Wed during her routine physical. She will instruct them to send lab results to Dr. Radford Pax.  Testing/Procedures: None ordered.  Follow-Up:  Please take your BP's at home everyday at lunch for the next week. Please call back the office with your findings.   Follow up appt with Ermalinda Barrios on August 26 @ 10AM.   12 mo follow up with Dr. Radford Pax  Any Other Special Instructions Will Be Listed Below (If Applicable).     If you need a refill on your cardiac medications before your next appointment, please call your pharmacy.

## 2018-10-13 LAB — IFOBT (OCCULT BLOOD): IFOBT: NEGATIVE

## 2018-11-02 ENCOUNTER — Telehealth: Payer: Self-pay | Admitting: Cardiology

## 2018-11-02 NOTE — Telephone Encounter (Signed)
I spoke with pt and gave her information from Dr. Radford Pax. Weight Watchers has previously been recommended to pt and I asked her to follow up on this. Pt will also try to decrease her soda intake.

## 2018-11-02 NOTE — Telephone Encounter (Signed)
I spoke with pt. She reports she saw rheumatologist today and he asked her why she wasn't on a fluid pill.  Pt reports she did not know why it was stopped. Rheumatologist asked her to follow up with cardiology. Chart reviewed and diuretics were stopped during recent hospitalization as dyspnea,edema and weight gain did not improve with lasix.  Felt to be related to obesity and OSA. Pt reports she noticed swelling in feet and ankles this past weekend. It worsens as the day goes along and when she is walking around a lot She is trying to keep feet elevated. Has not been wearing compression stockings but will try to wear them. She feels " a little bit" short of breath. OK going down stairs but notices it when walking upstairs. Weight at [place] office today was 280 lbs.  Pt states her home weight is 278 lbs She reports she was not having issues with swelling when she had last telemedicine visit with Dr Radford Pax.  She watches salt intake Takes prednisone for lupus and has been out of it for past 3 weeks.  Resumed by rheumatologist today. Pt drinks five 16 ounce bottles of water daily, four 16 ounce bottles of regular soda daily, a glass of milk in the evening and a cup of coffee daily.  Will forward to Dr. Radford Pax for review/recommendations.

## 2018-11-02 NOTE — Telephone Encounter (Signed)
New Message:     Pt said she saw her Rheumatologist today(Dr Leigh Aurora). He told her to call and find out why she was not taking a fluid pill. He said she need to start back taking a  fluid pill.

## 2018-11-02 NOTE — Telephone Encounter (Signed)
Her cath showed normal filling pressures and edema is related to obesity and likely worsened by her steroids.  Needs to lose weight and follow strict 2000mg  low Na diet

## 2018-11-09 DIAGNOSIS — R600 Localized edema: Secondary | ICD-10-CM | POA: Insufficient documentation

## 2018-11-09 NOTE — Progress Notes (Deleted)
Cardiology Office Note    Date:  11/09/2018   ID:  Kylie [name], DOB 01/23/1961, MRN [id number]  PCP:  [name], [name], MD  Cardiologist: [name], MD EPS: None  No chief complaint on file.   History of Present Illness:  Kylie [name] is a 58 y.o. female with history of chronic diastolic CHF, pulmonary hypertension, obesity, OSA on CPAP, lupus on chronic steroids.  She has chronic lower extremity edema and dyspnea on exertion and 2D echo showed normal LV function she had a normal BNP.  Cardiac cath showed normal right heart pressures.  Edema and shortness of breath were felt related to OSA and obesity and diuretics were stopped.  Lexiscan showed no ischemia.  Last office visit with [name] 10/06/2018 she had chronic lower extremity edema felt related to obesity.  Blood pressure was elevated and lisinopril was increased to 30 mg daily.  Exercise and weight loss program recommended.  Patient called in 11/02/2018 complaining of lower extremity edema and wondering why she is not on diuretics.  [name] recommended weight watchers.  Patient was also drinking 4-16 ounce bottles of soda daily and was asked to cut back.  Past Medical History:  Diagnosis Date  . Fibromyalgia   . Hearing loss   . Hypertension   . Obesity (BMI 30-39.9) 04/14/2014  . OSA (obstructive sleep apnea) 02/07/2015   Severe with AHI 36/hr now on CPAP at 10cm H2O  . Pulmonary HTN (HCC) 04/14/2014   Resolved by 2D echo 2020  . Sinus tachycardia 04/14/2014  . SLE (systemic lupus erythematosus) (HCC)   . Tobacco use     Past Surgical History:  Procedure Laterality Date  . CESAREAN SECTION    . CHOLECYSTECTOMY    . COLONOSCOPY N/A 07/09/2016   Procedure: COLONOSCOPY;  Surgeon: [name], MD;  [facility];  Service: Endoscopy;  Laterality: N/A;  9:00am  . RIGHT HEART CATH N/A 07/26/2018   Procedure: RIGHT HEART CATH;  Surgeon: [name], [name], MD;  [facility]: [facility];  Service:  Cardiovascular;  Laterality: N/A;  . RIGHT HEART CATHETERIZATION N/A 05/31/2014   Procedure: RIGHT HEART CATH;  Surgeon: [name], MD;  [facility];  Service: Cardiovascular;  Laterality: N/A;    Current Medications: No outpatient medications have been marked as taking for the 11/10/18 encounter (Appointment) with [name], [name], PA-C.     Allergies:   Ibuprofen, Amoxicillin, and Sulfa antibiotics   Social History   Socioeconomic History  . Marital status: Legally Separated    Spouse name: Not on file  . Number of children: 1  . Years of education: 7112  . Highest education level: Not on file  Occupational History  . Occupation: Disabled  Social Needs  . Financial resource strain: Not on file  . Food insecurity    Worry: Not on file    Inability: Not on file  . Transportation needs    Medical: Not on file    Non-medical: Not on file  Tobacco Use  . Smoking status: Current Every Day Smoker    Packs/day: 0.25    Years: 5.00    Pack years: 1.25    Types: Cigarettes  . Smokeless tobacco: Never Used  Substance and Sexual Activity  . Alcohol use: No  . Drug use: No  . Sexual activity: Not on file  Lifestyle  . Physical activity    Days per week: Not on file    Minutes  per session: Not on file  . Stress: Not on file  Relationships  . Social Musician on phone: Not on file    Gets together: Not on file    Attends religious service: Not on file    Active member of club or organization: Not on file    Attends meetings of clubs or organizations: Not on file    Relationship status: Not on file  Other Topics Concern  . Not on file  Social History Narrative   Lives alone.  Four grand children and one living child.       Family History:  The patient's ***family history includes Coronary artery disease (age of onset: 13) in her father; Lupus in her mother.   ROS:   Please see the history of present illness.    ROS All other systems reviewed and  are negative.   PHYSICAL EXAM:   VS:  There were no vitals taken for this visit.  Physical Exam  GEN: Well nourished, well developed, in no acute distress  HEENT: normal  Neck: no JVD, carotid bruits, or masses Cardiac:RRR; no murmurs, rubs, or gallops  Respiratory:  clear to auscultation bilaterally, normal work of breathing GI: soft, nontender, nondistended, + BS Ext: without cyanosis, clubbing, or edema, Good distal pulses bilaterally MS: no deformity or atrophy  Skin: warm and dry, no rash Neuro:  Alert and Oriented x 3, Strength and sensation are intact Psych: euthymic mood, full affect  Wt Readings from Last 3 Encounters:  10/06/18 258 lb (117 kg)  08/18/18 283 lb (128.4 kg)  08/18/18 275 lb (124.7 kg)      Studies/Labs Reviewed:   EKG:  EKG is*** ordered today.  The ekg ordered today demonstrates ***  Recent Labs: 07/24/2018: B Natriuretic Peptide 7.5 07/26/2018: Hemoglobin 11.9; Platelets 214 08/02/2018: BUN 15; Creatinine, Ser 1.01; Potassium 4.5; Sodium 131   Lipid Panel No results found for: CHOL, TRIG, HDL, CHOLHDL, VLDL, LDLCALC, LDLDIRECT  Additional studies/ records that were reviewed today include:  Lexiscan Myoview 6/3/2020Study Highlights     Nuclear stress EF: 68%.  There was no ST segment deviation noted during stress.  This is a low risk study. No ischemia.  The study is normal.   Donato Schultz, MD      Right heart cath 5/11/2020Findings:   RA =  5 RV = 28/9 PA = 28/6 (18) PCW = 9 Fick cardiac output/index = 6.7/2.9 PVR = 1.3 WU Ao sat = 98% PA sat = 76%, 77% SVC sat 74%   Assessment:   1. Normal filling pressures.    Arvilla Meres, MD  3:17 PM  2D echo [id number] Conclusions   - Left ventricle: The cavity size was normal. Wall thickness was   normal. Systolic function was normal. The estimated ejection   fraction was in the range of 55% to 60%. Wall motion was normal;   there were no regional wall motion abnormalities.  Doppler   parameters are consistent with abnormal left ventricular   relaxation (grade 1 diastolic dysfunction).   Impressions:   - Normal LV systolic function; mild diastolic dysfunction.     ASSESSMENT:    1. Essential hypertension   2. Lower extremity edema   3. Obesity (BMI 30-39.9)      PLAN:  In order of problems listed above:  Lower extremity edema felt secondary to obesity, chronic steroid use OSA.  Right heart cath with normal heart pressures 07/2018 Lexiscan normal 08/2018 echo in 2018  with grade 1 DD  Essential hypertension  Obesity exercise and weight loss program recommended    Medication Adjustments/Labs and Tests Ordered: Current medicines are reviewed at length with the patient today.  Concerns regarding medicines are outlined above.  Medication changes, Labs and Tests ordered today are listed in the Patient Instructions below. There are no Patient Instructions on file for this visit.   Signed, Ermalinda Barrios, PA-C  11/09/2018 3:13 PM    [facility] [place], [place], [place]  [address] Phone: [phone]; Fax: [phone]

## 2018-11-10 ENCOUNTER — Ambulatory Visit: Payer: Medicare Other | Admitting: Physician Assistant

## 2018-12-14 DIAGNOSIS — R0609 Other forms of dyspnea: Secondary | ICD-10-CM | POA: Insufficient documentation

## 2018-12-14 NOTE — Progress Notes (Signed)
Cardiology Office Note    Date:  12/15/2018   ID:  Tiwanda Threats, DOB June 18, 1960, MRN [id number]  PCP:  Monico Blitz, MD  Cardiologist: Fransico Him, MD EPS: None  No chief complaint on file.   History of Present Illness:  Kylie [name] is a 58 y.o. female with chronic diastolic CHF,  HTN,  Obesity, OSA on CPAP, lupus. Worsening DOE RHC normal right heart pressures 07/26/18, Lexiscan no ischmia. Diuretics stopped. Shortness of breath felt secondary to obesity and OSA.  LOV Dr. Radford Pax 10/06/18 lisinopril increased 30 mg daily for elevated BP.F/U Crt normal 0.92 11/02/18.  Patient says she's lost 31 lbs on her scales, 11 lbs on our scales.Being careful about food she's buying and eating. Her breathing is a little better.Trying to quit smoking. Down to 2-3 daily.    Past Medical History:  Diagnosis Date  . Fibromyalgia   . Hearing loss   . Hypertension   . Obesity (BMI 30-39.9) 04/14/2014  . OSA (obstructive sleep apnea) 02/07/2015   Severe with AHI 36/hr now on CPAP at 10cm H2O  . Pulmonary HTN ([place]) 04/14/2014   Resolved by 2D echo 2020  . Sinus tachycardia 04/14/2014  . SLE (systemic lupus erythematosus) ([place])   . Tobacco use     Past Surgical History:  Procedure Laterality Date  . CESAREAN SECTION    . CHOLECYSTECTOMY    . COLONOSCOPY N/A 07/09/2016   Procedure: COLONOSCOPY;  Surgeon: Danie Binder, MD;  [facility];  Service: Endoscopy;  Laterality: N/A;  9:00am  . RIGHT HEART CATH N/A 07/26/2018   Procedure: RIGHT HEART CATH;  Surgeon: Jolaine Artist, MD;  Location: [facility] CV LAB;  Service: Cardiovascular;  Laterality: N/A;  . RIGHT HEART CATHETERIZATION N/A 05/31/2014   Procedure: RIGHT HEART CATH;  Surgeon: Larey Dresser, MD;  Location: [facility] CATH LAB;  Service: Cardiovascular;  Laterality: N/A;    Current Medications: Current Meds  Medication Sig  . acetaminophen (TYLENOL) 500 MG tablet Take 500 mg by mouth as needed for moderate pain.    . Ascorbic Acid (VITAMIN C PO) Take 1 tablet by mouth daily.  . Calcium Carbonate-Vitamin D (CALCIUM PLUS VITAMIN D PO) Take 1 tablet by mouth daily. Takes with Milk Chocolate  . chlorzoxazone (PARAFON) 500 MG tablet Take 500 mg by mouth 4 (four) times daily as needed for muscle spasms.  . Cholecalciferol (VITAMIN D3 PO) Take 1 tablet by mouth daily.  . clindamycin (CLEOCIN) 300 MG capsule   . doxycycline (VIBRA-TABS) 100 MG tablet   . HYDROcodone-acetaminophen (NORCO/VICODIN) 5-325 MG tablet   . hydroxychloroquine (PLAQUENIL) 200 MG tablet Take 400 mg by mouth daily.  Marland Kitchen lisinopril (ZESTRIL) 30 MG tablet Take 1 tablet (30 mg total) by mouth daily.  . metoprolol succinate (TOPROL-XL) 25 MG 24 hr tablet Take 0.5 tablets (12.5 mg total) by mouth daily.  . Omega-3 Fatty Acids (FISH OIL PO) Take 1 tablet by mouth daily.  Marland Kitchen PARoxetine (PAXIL) 40 MG tablet Take 40 mg by mouth every evening.   . predniSONE (DELTASONE) 10 MG tablet Take 5 mg by mouth daily.  . traZODone (DESYREL) 100 MG tablet Take 100 mg by mouth at bedtime.  Marland Kitchen VITAMIN E PO Take 1 tablet by mouth daily.     Allergies:   Ibuprofen, Amoxicillin, and Sulfa antibiotics   Social History   Socioeconomic History  . Marital status: Legally Separated    Spouse name: Not on file  . Number  of children: 1  . Years of education: 72  . Highest education level: Not on file  Occupational History  . Occupation: Disabled  Social Needs  . Financial resource strain: Not on file  . Food insecurity    Worry: Not on file    Inability: Not on file  . Transportation needs    Medical: Not on file    Non-medical: Not on file  Tobacco Use  . Smoking status: Current Every Day Smoker    Packs/day: 0.25    Years: 5.00    Pack years: 1.25    Types: Cigarettes  . Smokeless tobacco: Never Used  Substance and Sexual Activity  . Alcohol use: No  . Drug use: No  . Sexual activity: Not on file  Lifestyle  . Physical activity    Days per week:  Not on file    Minutes per session: Not on file  . Stress: Not on file  Relationships  . Social Musician on phone: Not on file    Gets together: Not on file    Attends religious service: Not on file    Active member of club or organization: Not on file    Attends meetings of clubs or organizations: Not on file    Relationship status: Not on file  Other Topics Concern  . Not on file  Social History Narrative   Lives alone.  Four grand children and one living child.       Family History:  The patient's   family history includes Coronary artery disease (age of onset: 67) in her father; Lupus in her mother.   ROS:   Please see the history of present illness.    ROS All other systems reviewed and are negative.   PHYSICAL EXAM:   VS:  BP 118/68   Pulse 81   Ht 5\' 5"  (1.651 m)   Wt 272 lb 12.8 oz (123.7 kg)   SpO2 96%   BMI 45.40 kg/m   Physical Exam  , in no acute distress  Neck: no JVD, carotid bruits, or masses Cardiac:RRR; no murmurs, rubs, or gallops  Respiratory:  clear to auscultation bilaterally, normal work of breathing GI: soft, nontender, nondistended, + BS Ext: without cyanosis, clubbing, or edema, Good distal pulses bilaterally Neuro:  Alert and Oriented x 3, Psych: euthymic mood, full affect  Wt Readings from Last 3 Encounters:  12/15/18 272 lb 12.8 oz (123.7 kg)  10/06/18 258 lb (117 kg)  08/18/18 283 lb (128.4 kg)      Studies/Labs Reviewed:   EKG:  EKG is not ordered today.  Recent Labs: 07/24/2018: B Natriuretic Peptide 7.5 07/26/2018: Hemoglobin 11.9; Platelets 214 08/02/2018: BUN 15; Creatinine, Ser 1.01; Potassium 4.5; Sodium 131   Lipid Panel No results found for: CHOL, TRIG, HDL, CHOLHDL, VLDL, LDLCALC, LDLDIRECT  Additional studies/ records that were reviewed today include:  RHC 07/26/18 Findings:   RA =  5 RV = 28/9 PA = 28/6 (18) PCW = 9 Fick cardiac output/index = 6.7/2.9 PVR = 1.3 WU Ao sat = 98% PA sat = 76%,  77% SVC sat 74%   Assessment:   1. Normal filling pressures.    09/25/18, MD  3:17 PM   Echo 5/10/20IMPRESSIONS      1. The left ventricle has normal systolic function, with an ejection fraction of 60-65%. The cavity size was normal. Left ventricular diastolic parameters were normal.  2. The right ventricle has normal systolc function. The  cavity was normal. There is no increase in right ventricular wall thickness. Right ventricular systolic pressure could not be assessed.  3. The aortic root is normal in size and structure.   Lexiscan 08/18/18 Study Highlights     Nuclear stress EF: 68%.  There was no ST segment deviation noted during stress.  This is a low risk study. No ischemia.  The study is normal.   Donato SchultzMark Skains, MD      ASSESSMENT:    1. Exertional dyspnea   2. Chronic diastolic CHF (congestive heart failure) (HCC)   3. Essential hypertension   4. Morbid obesity (HCC)   5. OSA (obstructive sleep apnea)   6. Tobacco abuse      PLAN:  In order of problems listed above:  Dyspnea on exertion felt secondary to Obesity and OSA  Chronic diastolic CHF compensated  Essential  HTN better controlled on increase lisinopril and bmet stable  Obesity has lost 31 lbs on her scales 11 on ours. Regardless she is working on her diet and doing well.  OSA compliant on CPAP  Tobacco abuse trying to quit.    Medication Adjustments/Labs and Tests Ordered: Current medicines are reviewed at length with the patient today.  Concerns regarding medicines are outlined above.  Medication changes, Labs and Tests ordered today are listed in the Patient Instructions below. Patient Instructions  Medication Instructions:  Your physician recommends that you continue on your current medications as directed. Please refer to the Current Medication list given to you today.  If you need a refill on your cardiac medications before your next appointment, please call your  pharmacy.   Lab work: None ordered  If you have labs (blood work) drawn today and your tests are completely normal, you will receive your results only by: Marland Kitchen. MyChart Message (if you have MyChart) OR . A paper copy in the mail If you have any lab test that is abnormal or we need to change your treatment, we will call you to review the results.  Testing/Procedures: None ordered  Follow-Up: At [facility], you and your health needs are our priority.  As part of our continuing mission to provide you with exceptional heart care, we have created designated Provider Care Teams.  These Care Teams include your primary Cardiologist (physician) and Advanced Practice Providers (APPs -  Physician Assistants and Nurse Practitioners) who all work together to provide you with the care you need, when you need it. . You will need a follow up appointment in 6 months.  Please call our office 2 months in advance to schedule this appointment.  You may see [name], MD or one of the following Advanced Practice Providers on your designated Care Team:   . Robbie LisBrittainy Simmons, PA-C . Dayna Dunn, PA-C . Jacolyn ReedyMichele Lenze, PA-C  Any Other Special Instructions Will Be Listed Below (If Applicable).       Elson ClanSigned, Michele Lenze, PA-C  12/15/2018 10:38 AM    [facility] [address], [place], [place]  [address] Phone: [phone]; Fax: [phone]

## 2018-12-15 ENCOUNTER — Ambulatory Visit (INDEPENDENT_AMBULATORY_CARE_PROVIDER_SITE_OTHER): Payer: Medicare Other | Admitting: Physician Assistant

## 2018-12-15 ENCOUNTER — Encounter (INDEPENDENT_AMBULATORY_CARE_PROVIDER_SITE_OTHER): Payer: Self-pay

## 2018-12-15 ENCOUNTER — Encounter: Payer: Self-pay | Admitting: Physician Assistant

## 2018-12-15 ENCOUNTER — Other Ambulatory Visit: Payer: Self-pay

## 2018-12-15 VITALS — BP 118/68 | HR 81 | Ht 65.0 in | Wt 272.8 lb

## 2018-12-15 DIAGNOSIS — R0609 Other forms of dyspnea: Secondary | ICD-10-CM | POA: Diagnosis not present

## 2018-12-15 DIAGNOSIS — G4733 Obstructive sleep apnea (adult) (pediatric): Secondary | ICD-10-CM

## 2018-12-15 DIAGNOSIS — Z720 Tobacco use: Secondary | ICD-10-CM

## 2018-12-15 DIAGNOSIS — I10 Essential (primary) hypertension: Secondary | ICD-10-CM | POA: Diagnosis not present

## 2018-12-15 DIAGNOSIS — I5032 Chronic diastolic (congestive) heart failure: Secondary | ICD-10-CM

## 2018-12-15 NOTE — Patient Instructions (Addendum)
Medication Instructions:  Your physician recommends that you continue on your current medications as directed. Please refer to the Current Medication list given to you today.  If you need a refill on your cardiac medications before your next appointment, please call your pharmacy.   Lab work: None ordered  If you have labs (blood work) drawn today and your tests are completely normal, you will receive your results only by: . MyChart Message (if you have MyChart) OR . A paper copy in the mail If you have any lab test that is abnormal or we need to change your treatment, we will call you to review the results.  Testing/Procedures: None ordered  Follow-Up: At [facility], you and your health needs are our priority.  As part of our continuing mission to provide you with exceptional heart care, we have created designated Provider Care Teams.  These Care Teams include your primary Cardiologist (physician) and Advanced Practice Providers (APPs -  Physician Assistants and Nurse Practitioners) who all work together to provide you with the care you need, when you need it. You will need a follow up appointment in 6 months.  Please call our office 2 months in advance to schedule this appointment.  You may see Traci Turner, MD or one of the following Advanced Practice Providers on your designated Care Team:   Brittainy Simmons, PA-C Dayna Dunn, PA-C . Michele Lenze, PA-C  Any Other Special Instructions Will Be Listed Below (If Applicable).    

## 2019-04-06 DIAGNOSIS — G4733 Obstructive sleep apnea (adult) (pediatric): Secondary | ICD-10-CM | POA: Diagnosis not present

## 2019-05-10 DIAGNOSIS — R21 Rash and other nonspecific skin eruption: Secondary | ICD-10-CM | POA: Diagnosis not present

## 2019-05-10 DIAGNOSIS — M329 Systemic lupus erythematosus, unspecified: Secondary | ICD-10-CM | POA: Diagnosis not present

## 2019-05-10 DIAGNOSIS — M797 Fibromyalgia: Secondary | ICD-10-CM | POA: Diagnosis not present

## 2019-05-25 DIAGNOSIS — Z299 Encounter for prophylactic measures, unspecified: Secondary | ICD-10-CM | POA: Diagnosis not present

## 2019-05-25 DIAGNOSIS — F17210 Nicotine dependence, cigarettes, uncomplicated: Secondary | ICD-10-CM | POA: Diagnosis not present

## 2019-05-25 DIAGNOSIS — I10 Essential (primary) hypertension: Secondary | ICD-10-CM | POA: Diagnosis not present

## 2019-05-25 DIAGNOSIS — M329 Systemic lupus erythematosus, unspecified: Secondary | ICD-10-CM | POA: Diagnosis not present

## 2019-05-25 DIAGNOSIS — M2550 Pain in unspecified joint: Secondary | ICD-10-CM | POA: Diagnosis not present

## 2019-05-25 DIAGNOSIS — I5032 Chronic diastolic (congestive) heart failure: Secondary | ICD-10-CM | POA: Diagnosis not present

## 2019-06-08 NOTE — Progress Notes (Signed)
Cardiology Office Note    Date:  06/14/2019   ID:  Kylie, [name] 06/28/1960, MRN [id number]  PCP:  Monico Blitz, MD  Cardiologist: Fransico Him, MD EPS: None  Chief Complaint  Patient presents with  . Follow-up    History of Present Illness:  Kylie [name] is a 59 y.o. female with chronic diastolic CHF, HTN, Obesity, OSA on CPAP, lupus. Worsening DOE RHC normal right heart pressures 07/26/18, Lexiscan 08/2018 no ischmia. Diuretics stopped. Shortness of breath felt secondary to obesity and OSA.   I last saw the patient 12/15/2018 at which time she had lost some weight and was watching what she was eating.  She is also trying to quit smoking.  Patient comes in for f/u. Has lost 24 lbs using a supplement she found on facebook. Says the pill she takes is a fat burner. Walks 1 miles when the weather is good. . Denies chest pain, shortness of breath. Occasional swelling in ankles but doing better since off soda and watching her salt.Still smoking 2 cigarettes daily.   Past Medical History:  Diagnosis Date  . Fibromyalgia   . Hearing loss   . Hypertension   . Obesity (BMI 30-39.9) 04/14/2014  . OSA (obstructive sleep apnea) 02/07/2015   Severe with AHI 36/hr now on CPAP at 10cm H2O  . Pulmonary HTN ([place]) 04/14/2014   Resolved by 2D echo 2020  . Sinus tachycardia 04/14/2014  . SLE (systemic lupus erythematosus) ([place])   . Tobacco use     Past Surgical History:  Procedure Laterality Date  . CESAREAN SECTION    . CHOLECYSTECTOMY    . COLONOSCOPY N/A 07/09/2016   Procedure: COLONOSCOPY;  Surgeon: Danie Binder, MD;  Location: [facility];  Service: Endoscopy;  Laterality: N/A;  9:00am  . RIGHT HEART CATH N/A 07/26/2018   Procedure: RIGHT HEART CATH;  Surgeon: Jolaine Artist, MD;  Location: [facility];  Service: Cardiovascular;  Laterality: N/A;  . RIGHT HEART CATHETERIZATION N/A 05/31/2014   Procedure: RIGHT HEART CATH;  Surgeon: Larey Dresser, MD;  Location:  [facility];  Service: Cardiovascular;  Laterality: N/A;    Current Medications: Current Meds  Medication Sig  . acetaminophen (TYLENOL) 500 MG tablet Take 500 mg by mouth as needed for moderate pain.   . Ascorbic Acid (VITAMIN C PO) Take 1 tablet by mouth daily.  . Calcium Carbonate-Vitamin D (CALCIUM PLUS VITAMIN D PO) Take 1 tablet by mouth daily. Takes with Milk Chocolate  . chlorzoxazone (PARAFON) 500 MG tablet Take 500 mg by mouth 4 (four) times daily as needed for muscle spasms.  . Cholecalciferol (VITAMIN D3 PO) Take 1 tablet by mouth daily.  Marland Kitchen HYDROcodone-acetaminophen (NORCO/VICODIN) 5-325 MG tablet Take 1-2 tablets by mouth as needed.   . hydroxychloroquine (PLAQUENIL) 200 MG tablet Take 400 mg by mouth daily.  Marland Kitchen lisinopril (ZESTRIL) 30 MG tablet Take 1 tablet (30 mg total) by mouth daily.  . metoprolol succinate (TOPROL-XL) 25 MG 24 hr tablet Take 0.5 tablets (12.5 mg total) by mouth daily.  . Omega-3 Fatty Acids (FISH OIL PO) Take 1 tablet by mouth daily.  Marland Kitchen PARoxetine (PAXIL) 40 MG tablet Take 40 mg by mouth every evening.   . predniSONE (DELTASONE) 10 MG tablet Take 5 mg by mouth 3 (three) times a week.   . traZODone (DESYREL) 100 MG tablet Take 100 mg by mouth at bedtime.  Marland Kitchen VITAMIN E PO Take 1 tablet by mouth daily.  Allergies:   Ibuprofen, Amoxicillin, and Sulfa antibiotics   Social History   Socioeconomic History  . Marital status: Legally Separated    Spouse name: Not on file  . Number of children: 1  . Years of education: 34  . Highest education level: Not on file  Occupational History  . Occupation: Disabled  Tobacco Use  . Smoking status: Current Every Day Smoker    Packs/day: 0.25    Years: 5.00    Pack years: 1.25    Types: Cigarettes  . Smokeless tobacco: Never Used  Substance and Sexual Activity  . Alcohol use: No  . Drug use: No  . Sexual activity: Not on file  Other Topics Concern  . Not on file  Social History Narrative   Lives alone.   Four grand children and one living child.     Social Determinants of Health   Financial Resource Strain:   . Difficulty of Paying Living Expenses:   Food Insecurity:   . Worried About Programme researcher, broadcasting/film/video in the Last Year:   . Barista in the Last Year:   Transportation Needs:   . Freight forwarder (Medical):   Marland Kitchen Lack of Transportation (Non-Medical):   Physical Activity:   . Days of Exercise per Week:   . Minutes of Exercise per Session:   Stress:   . Feeling of Stress :   Social Connections:   . Frequency of Communication with Friends and Family:   . Frequency of Social Gatherings with Friends and Family:   . Attends Religious Services:   . Active Member of Clubs or Organizations:   . Attends Banker Meetings:   Marland Kitchen Marital Status:      Family History:  The patient's family history includes Coronary artery disease (age of onset: 39) in her father; Lupus in her mother.   ROS:   Please see the history of present illness.    ROS All other systems reviewed and are negative.   PHYSICAL EXAM:   VS:  BP 120/74   Pulse 64   Ht 5\' 5"  (1.651 m)   Wt 259 lb 1.9 oz (117.5 kg)   SpO2 97%   BMI 43.12 kg/m   Physical Exam  GEN: Obese, in no acute distress  Neck: no JVD, carotid bruits, or masses Cardiac:RRR; no murmurs, rubs, or gallops  Respiratory:  Decreased breath sounds.clear to auscultation bilaterally, normal work of breathing GI: soft, nontender, nondistended, + BS Ext: without cyanosis, clubbing, or edema, Good distal pulses bilaterally Neuro:  Alert and Oriented x 3,  Psych: euthymic mood, full affect  Wt Readings from Last 3 Encounters:  06/14/19 259 lb 1.9 oz (117.5 kg)  12/15/18 272 lb 12.8 oz (123.7 kg)  10/06/18 258 lb (117 kg)      Studies/Labs Reviewed:   EKG:  EKG is not ordered today.   Recent Labs: 07/24/2018: B Natriuretic Peptide 7.5 07/26/2018: Hemoglobin 11.9; Platelets 214 08/02/2018: BUN 15; Creatinine, Ser 1.01; Potassium  4.5; Sodium 131   Lipid Panel No results found for: CHOL, TRIG, HDL, CHOLHDL, VLDL, LDLCALC, LDLDIRECT  Additional studies/ records that were reviewed today include:  RHC 07/26/18 Findings:   RA =  5 RV = 28/9 PA = 28/6 (18) PCW = 9 Fick cardiac output/index = 6.7/2.9 PVR = 1.3 WU Ao sat = 98% PA sat = 76%, 77% SVC sat 74%   Assessment:   1. Normal filling pressures.    09/25/18, MD  3:17 PM   Echo 5/10/20IMPRESSIONS      1. The left ventricle has normal systolic function, with an ejection fraction of 60-65%. The cavity size was normal. Left ventricular diastolic parameters were normal.  2. The right ventricle has normal systolc function. The cavity was normal. There is no increase in right ventricular wall thickness. Right ventricular systolic pressure could not be assessed.  3. The aortic root is normal in size and structure.   Lexiscan 08/18/18 Study Highlights      Nuclear stress EF: 68%.  There was no ST segment deviation noted during stress.  This is a low risk study. No ischemia.  The study is normal.   Donato Schultz, MD              ASSESSMENT:    1. Chronic diastolic CHF (congestive heart failure) (HCC)   2. Essential hypertension   3. OSA (obstructive sleep apnea)   4. Obesity (BMI 30-39.9)   5. Tobacco abuse      PLAN:  In order of problems listed above:  Chronic diastolic CHF compensated   Essential hypertension well controlled on lisionpril, toprol   OSA compliant on CPAP  Obesity has lost 24 lbs by her scales on a supplement she's taking. Also walking-recommend 150 min exercise weekly  Tobacco abuse still smoking 1-2 cigarettes daily-smoking cessation discussed.   Medication Adjustments/Labs and Tests Ordered: Current medicines are reviewed at length with the patient today.  Concerns regarding medicines are outlined above.  Medication changes, Labs and Tests ordered today are listed in the Patient Instructions  below. Patient Instructions  Medication Instructions:  Your physician recommends that you continue on your current medications as directed. Please refer to the Current Medication list given to you today.  *If you need a refill on your cardiac medications before your next appointment, please call your pharmacy*   Lab Work: Today CMET, CBC, Lipid, TSH  If you have labs (blood work) drawn today and your tests are completely normal, you will receive your results only by: Marland Kitchen MyChart Message (if you have MyChart) OR . A paper copy in the mail If you have any lab test that is abnormal or we need to change your treatment, we will call you to review the results.   Testing/Procedures: None   Follow-Up: At [facility], you and your health needs are our priority.  As part of our continuing mission to provide you with exceptional heart care, we have created designated Provider Care Teams.  These Care Teams include your primary Cardiologist (physician) and Advanced Practice Providers (APPs -  Physician Assistants and Nurse Practitioners) who all work together to provide you with the care you need, when you need it.  We recommend signing up for the patient portal called "MyChart".  Sign up information is provided on this After Visit Summary.  MyChart is used to connect with patients for Virtual Visits (Telemedicine).  Patients are able to view lab/test results, encounter notes, upcoming appointments, etc.  Non-urgent messages can be sent to your provider as well.   To learn more about what you can do with MyChart, go to [email or web address].    Your next appointment:   6 month(s)  The format for your next appointment:   In Person  Provider:   You may see Armanda Magic, MD or one of the following Advanced Practice Providers on your designated Care Team:    Ronie Spies, PA-C  Jacolyn Reedy, PA-C    Other Instructions  Steps to  Quit Smoking Smoking tobacco is the leading cause of  preventable death. It can affect almost every organ in the body. Smoking puts you and those around you at risk for developing many serious chronic diseases. Quitting smoking can be difficult, but it is one of the best things that you can do for your health. It is never too late to quit. How do I get ready to quit? When you decide to quit smoking, create a plan to help you succeed. Before you quit:  Pick a date to quit. Set a date within the next 2 weeks to give you time to prepare.  Write down the reasons why you are quitting. Keep this list in places where you will see it often.  Tell your family, friends, and co-workers that you are quitting. Support from your loved ones can make quitting easier.  Talk with your health care provider about your options for quitting smoking.  Find out what treatment options are covered by your health insurance.  Identify people, places, things, and activities that make you want to smoke (triggers). Avoid them. What first steps can I take to quit smoking?  Throw away all cigarettes at home, at work, and in your car.  Throw away smoking accessories, such as Set designer.  Clean your car. Make sure to empty the ashtray.  Clean your home, including curtains and carpets. What strategies can I use to quit smoking? Talk with your health care provider about combining strategies, such as taking medicines while you are also receiving in-person counseling. Using these two strategies together makes you more likely to succeed in quitting than if you used either strategy on its own.  If you are pregnant or breastfeeding, talk with your health care provider about finding counseling or other support strategies to quit smoking. Do not take medicine to help you quit smoking unless your health care provider tells you to do so. To quit smoking: Quit right away  Quit smoking completely, instead of gradually reducing how much you smoke over a period of time.  Research shows that stopping smoking right away is more successful than gradually quitting.  Attend in-person counseling to help you build problem-solving skills. You are more likely to succeed in quitting if you attend counseling sessions regularly. Even short sessions of 10 minutes can be effective. Take medicine You may take medicines to help you quit smoking. Some medicines require a prescription and some you can purchase over-the-counter. Medicines may have nicotine in them to replace the nicotine in cigarettes. Medicines may:  Help to stop cravings.  Help to relieve withdrawal symptoms. Your health care provider may recommend:  Nicotine patches, gum, or lozenges.  Nicotine inhalers or sprays.  Non-nicotine medicine that is taken by mouth. Find resources Find resources and support systems that can help you to quit smoking and remain smoke-free after you quit. These resources are most helpful when you use them often. They include:  Online chats with a Veterinary surgeon.  Telephone quitlines.  Printed Materials engineer.  Support groups or group counseling.  Text messaging programs.  Mobile phone apps or applications. Use apps that can help you stick to your quit plan by providing reminders, tips, and encouragement. There are many free apps for mobile devices as well as websites. Examples include Quit Guide from the [employer] and smokefree.gov What things can I do to make it easier to quit?   Reach out to your family and friends for support and encouragement. Call telephone quitlines (1-800-QUIT-NOW), reach out  to support groups, or work with a Veterinary surgeoncounselor for support.  Ask people who smoke to avoid smoking around you.  Avoid places that trigger you to smoke, such as bars, parties, or smoke-break areas at work.  Spend time with people who do not smoke.  Lessen the stress in your life. Stress can be a smoking trigger for some people. To lessen stress, try: ? Exercising  regularly. ? Doing deep-breathing exercises. ? Doing yoga. ? Meditating. ? Performing a body scan. This involves closing your eyes, scanning your body from head to toe, and noticing which parts of your body are particularly tense. Try to relax the muscles in those areas. How will I feel when I quit smoking? Day 1 to 3 weeks Within the first 24 hours of quitting smoking, you may start to feel withdrawal symptoms. These symptoms are usually most noticeable 2-3 days after quitting, but they usually do not last for more than 2-3 weeks. You may experience these symptoms:  Mood swings.  Restlessness, anxiety, or irritability.  Trouble concentrating.  Dizziness.  Strong cravings for sugary foods and nicotine.  Mild weight gain.  Constipation.  Nausea.  Coughing or a sore throat.  Changes in how the medicines that you take for unrelated issues work in your body.  Depression.  Trouble sleeping (insomnia). Week 3 and afterward After the first 2-3 weeks of quitting, you may start to notice more positive results, such as:  Improved sense of smell and taste.  Decreased coughing and sore throat.  Slower heart rate.  Lower blood pressure.  Clearer skin.  The ability to breathe more easily.  Fewer sick days. Quitting smoking can be very challenging. Do not get discouraged if you are not successful the first time. Some people need to make many attempts to quit before they achieve long-term success. Do your best to stick to your quit plan, and talk with your health care provider if you have any questions or concerns. Summary  Smoking tobacco is the leading cause of preventable death. Quitting smoking is one of the best things that you can do for your health.  When you decide to quit smoking, create a plan to help you succeed.  Quit smoking right away, not slowly over a period of time.  When you start quitting, seek help from your health care provider, family, or friends. This  information is not intended to replace advice given to you by your health care provider. Make sure you discuss any questions you have with your health care provider. Document Revised: 11/26/2018 Document Reviewed: 05/22/2018 Elsevier Patient Education  [address]2020 Elsevier Inc.      Signed, Jacolyn ReedyMichele Elba Dendinger, [place] JerseyPA-C  06/14/2019 10:41 AM    [facility] [address], [place], [place]  [address] Phone: [phone]; Fax: [phone]

## 2019-06-14 ENCOUNTER — Other Ambulatory Visit: Payer: Self-pay

## 2019-06-14 ENCOUNTER — Encounter: Payer: Self-pay | Admitting: Physician Assistant

## 2019-06-14 ENCOUNTER — Ambulatory Visit: Payer: Medicare Other | Admitting: Physician Assistant

## 2019-06-14 VITALS — BP 120/74 | HR 64 | Ht 65.0 in | Wt 259.1 lb

## 2019-06-14 DIAGNOSIS — G4733 Obstructive sleep apnea (adult) (pediatric): Secondary | ICD-10-CM | POA: Diagnosis not present

## 2019-06-14 DIAGNOSIS — I10 Essential (primary) hypertension: Secondary | ICD-10-CM | POA: Diagnosis not present

## 2019-06-14 DIAGNOSIS — I5032 Chronic diastolic (congestive) heart failure: Secondary | ICD-10-CM

## 2019-06-14 DIAGNOSIS — E669 Obesity, unspecified: Secondary | ICD-10-CM | POA: Diagnosis not present

## 2019-06-14 DIAGNOSIS — Z720 Tobacco use: Secondary | ICD-10-CM

## 2019-06-14 LAB — LIPID PANEL
Chol/HDL Ratio: 2.8 ratio (ref 0.0–4.4)
Cholesterol, Total: 133 mg/dL (ref 100–199)
HDL: 47 mg/dL (ref >39)
LDL Chol Calc (NIH): 68 mg/dL (ref 0–99)
Triglycerides: 97 mg/dL (ref 0–149)
VLDL Cholesterol Cal: 18 mg/dL (ref 5–40)

## 2019-06-14 LAB — COMPREHENSIVE METABOLIC PANEL
ALT: 21 IU/L (ref 0–32)
AST: 15 IU/L (ref 0–40)
Albumin/Globulin Ratio: 1.1 — ABNORMAL LOW (ref 1.2–2.2)
Albumin: 3.9 g/dL (ref 3.8–4.9)
Alkaline Phosphatase: 79 IU/L (ref 39–117)
BUN/Creatinine Ratio: 16 (ref 9–23)
BUN: 13 mg/dL (ref 6–24)
Bilirubin Total: 0.3 mg/dL (ref 0.0–1.2)
CO2: 25 mmol/L (ref 20–29)
Calcium: 9.3 mg/dL (ref 8.7–10.2)
Chloride: 100 mmol/L (ref 96–106)
Creatinine, Ser: 0.83 mg/dL (ref 0.57–1.00)
GFR calc Af Amer: 89 mL/min/{1.73_m2} (ref >59)
GFR calc non Af Amer: 77 mL/min/{1.73_m2} (ref >59)
Globulin, Total: 3.6 g/dL (ref 1.5–4.5)
Glucose: 81 mg/dL (ref 65–99)
Potassium: 4.3 mmol/L (ref 3.5–5.2)
Sodium: 138 mmol/L (ref 134–144)
Total Protein: 7.5 g/dL (ref 6.0–8.5)

## 2019-06-14 LAB — CBC
Hematocrit: 43 % (ref 34.0–46.6)
Hemoglobin: 14.2 g/dL (ref 11.1–15.9)
MCH: 29 pg (ref 26.6–33.0)
MCHC: 33 g/dL (ref 31.5–35.7)
MCV: 88 fL (ref 79–97)
Platelets: 202 10*3/uL (ref 150–450)
RBC: 4.89 x10E6/uL (ref 3.77–5.28)
RDW: 12.8 % (ref 11.7–15.4)
WBC: 6.1 10*3/uL (ref 3.4–10.8)

## 2019-06-14 LAB — TSH: TSH: 2.34 u[IU]/mL (ref 0.450–4.500)

## 2019-06-14 NOTE — Patient Instructions (Signed)
Medication Instructions:  Your physician recommends that you continue on your current medications as directed. Please refer to the Current Medication list given to you today.  *If you need a refill on your cardiac medications before your next appointment, please call your pharmacy*   Lab Work: Today CMET, CBC, Lipid, TSH  If you have labs (blood work) drawn today and your tests are completely normal, you will receive your results only by: Marland Kitchen MyChart Message (if you have MyChart) OR . A paper copy in the mail If you have any lab test that is abnormal or we need to change your treatment, we will call you to review the results.   Testing/Procedures: None   Follow-Up: At [facility], you and your health needs are our priority.  As part of our continuing mission to provide you with exceptional heart care, we have created designated Provider Care Teams.  These Care Teams include your primary Cardiologist (physician) and Advanced Practice Providers (APPs -  Physician Assistants and Nurse Practitioners) who all work together to provide you with the care you need, when you need it.  We recommend signing up for the patient portal called "MyChart".  Sign up information is provided on this After Visit Summary.  MyChart is used to connect with patients for Virtual Visits (Telemedicine).  Patients are able to view lab/test results, encounter notes, upcoming appointments, etc.  Non-urgent messages can be sent to your provider as well.   To learn more about what you can do with MyChart, go to [email or web address].    Your next appointment:   6 month(s)  The format for your next appointment:   In Person  Provider:   You may see Armanda Magic, MD or one of the following Advanced Practice Providers on your designated Care Team:    Ronie Spies, PA-C  Jacolyn Reedy, PA-C    Other Instructions  Steps to Quit Smoking Smoking tobacco is the leading cause of preventable death. It can affect  almost every organ in the body. Smoking puts you and those around you at risk for developing many serious chronic diseases. Quitting smoking can be difficult, but it is one of the best things that you can do for your health. It is never too late to quit. How do I get ready to quit? When you decide to quit smoking, create a plan to help you succeed. Before you quit:  Pick a date to quit. Set a date within the next 2 weeks to give you time to prepare.  Write down the reasons why you are quitting. Keep this list in places where you will see it often.  Tell your family, friends, and co-workers that you are quitting. Support from your loved ones can make quitting easier.  Talk with your health care provider about your options for quitting smoking.  Find out what treatment options are covered by your health insurance.  Identify people, places, things, and activities that make you want to smoke (triggers). Avoid them. What first steps can I take to quit smoking?  Throw away all cigarettes at home, at work, and in your car.  Throw away smoking accessories, such as Set designer.  Clean your car. Make sure to empty the ashtray.  Clean your home, including curtains and carpets. What strategies can I use to quit smoking? Talk with your health care provider about combining strategies, such as taking medicines while you are also receiving in-person counseling. Using these two strategies together makes you more likely to  succeed in quitting than if you used either strategy on its own.  If you are pregnant or breastfeeding, talk with your health care provider about finding counseling or other support strategies to quit smoking. Do not take medicine to help you quit smoking unless your health care provider tells you to do so. To quit smoking: Quit right away  Quit smoking completely, instead of gradually reducing how much you smoke over a period of time. Research shows that stopping smoking  right away is more successful than gradually quitting.  Attend in-person counseling to help you build problem-solving skills. You are more likely to succeed in quitting if you attend counseling sessions regularly. Even short sessions of 10 minutes can be effective. Take medicine You may take medicines to help you quit smoking. Some medicines require a prescription and some you can purchase over-the-counter. Medicines may have nicotine in them to replace the nicotine in cigarettes. Medicines may:  Help to stop cravings.  Help to relieve withdrawal symptoms. Your health care provider may recommend:  Nicotine patches, gum, or lozenges.  Nicotine inhalers or sprays.  Non-nicotine medicine that is taken by mouth. Find resources Find resources and support systems that can help you to quit smoking and remain smoke-free after you quit. These resources are most helpful when you use them often. They include:  Online chats with a Social worker.  Telephone quitlines.  Printed Furniture conservator/restorer.  Support groups or group counseling.  Text messaging programs.  Mobile phone apps or applications. Use apps that can help you stick to your quit plan by providing reminders, tips, and encouragement. There are many free apps for mobile devices as well as websites. Examples include Quit Guide from the [employer] and smokefree.gov What things can I do to make it easier to quit?   Reach out to your family and friends for support and encouragement. Call telephone quitlines (1-800-QUIT-NOW), reach out to support groups, or work with a counselor for support.  Ask people who smoke to avoid smoking around you.  Avoid places that trigger you to smoke, such as bars, parties, or smoke-break areas at work.  Spend time with people who do not smoke.  Lessen the stress in your life. Stress can be a smoking trigger for some people. To lessen stress, try: ? Exercising regularly. ? Doing deep-breathing exercises. ? Doing  yoga. ? Meditating. ? Performing a body scan. This involves closing your eyes, scanning your body from head to toe, and noticing which parts of your body are particularly tense. Try to relax the muscles in those areas. How will I feel when I quit smoking? Day 1 to 3 weeks Within the first 24 hours of quitting smoking, you may start to feel withdrawal symptoms. These symptoms are usually most noticeable 2-3 days after quitting, but they usually do not last for more than 2-3 weeks. You may experience these symptoms:  Mood swings.  Restlessness, anxiety, or irritability.  Trouble concentrating.  Dizziness.  Strong cravings for sugary foods and nicotine.  Mild weight gain.  Constipation.  Nausea.  Coughing or a sore throat.  Changes in how the medicines that you take for unrelated issues work in your body.  Depression.  Trouble sleeping (insomnia). Week 3 and afterward After the first 2-3 weeks of quitting, you may start to notice more positive results, such as:  Improved sense of smell and taste.  Decreased coughing and sore throat.  Slower heart rate.  Lower blood pressure.  Clearer skin.  The ability to breathe  more easily.  Fewer sick days. Quitting smoking can be very challenging. Do not get discouraged if you are not successful the first time. Some people need to make many attempts to quit before they achieve long-term success. Do your best to stick to your quit plan, and talk with your health care provider if you have any questions or concerns. Summary  Smoking tobacco is the leading cause of preventable death. Quitting smoking is one of the best things that you can do for your health.  When you decide to quit smoking, create a plan to help you succeed.  Quit smoking right away, not slowly over a period of time.  When you start quitting, seek help from your health care provider, family, or friends. This information is not intended to replace advice given to  you by your health care provider. Make sure you discuss any questions you have with your health care provider. Document Revised: 11/26/2018 Document Reviewed: 05/22/2018 Elsevier Patient Education  2020 [employer].

## 2019-06-15 ENCOUNTER — Telehealth: Payer: Self-pay | Admitting: Cardiology

## 2019-06-15 NOTE — Telephone Encounter (Signed)
The patient has been notified of the result and verbalized understanding.  All questions (if any) were answered. Lattie Haw, RN 06/15/2019 8:48 AM

## 2019-06-15 NOTE — Telephone Encounter (Signed)
New Message  Patient is returning call. Please give patient a call back with the results.

## 2019-07-28 DIAGNOSIS — G4733 Obstructive sleep apnea (adult) (pediatric): Secondary | ICD-10-CM | POA: Diagnosis not present

## 2019-08-05 ENCOUNTER — Other Ambulatory Visit: Payer: Self-pay | Admitting: Cardiology

## 2019-08-08 DIAGNOSIS — I10 Essential (primary) hypertension: Secondary | ICD-10-CM | POA: Diagnosis not present

## 2019-08-08 DIAGNOSIS — M3500 Sicca syndrome, unspecified: Secondary | ICD-10-CM | POA: Diagnosis not present

## 2019-08-08 DIAGNOSIS — I5032 Chronic diastolic (congestive) heart failure: Secondary | ICD-10-CM | POA: Diagnosis not present

## 2019-08-08 DIAGNOSIS — Z299 Encounter for prophylactic measures, unspecified: Secondary | ICD-10-CM | POA: Diagnosis not present

## 2019-08-08 DIAGNOSIS — M545 Low back pain: Secondary | ICD-10-CM | POA: Diagnosis not present

## 2019-08-22 DIAGNOSIS — I5032 Chronic diastolic (congestive) heart failure: Secondary | ICD-10-CM | POA: Diagnosis not present

## 2019-08-22 DIAGNOSIS — Z299 Encounter for prophylactic measures, unspecified: Secondary | ICD-10-CM | POA: Diagnosis not present

## 2019-08-22 DIAGNOSIS — M329 Systemic lupus erythematosus, unspecified: Secondary | ICD-10-CM | POA: Diagnosis not present

## 2019-08-22 DIAGNOSIS — I10 Essential (primary) hypertension: Secondary | ICD-10-CM | POA: Diagnosis not present

## 2019-08-22 DIAGNOSIS — M1710 Unilateral primary osteoarthritis, unspecified knee: Secondary | ICD-10-CM | POA: Diagnosis not present

## 2019-10-06 ENCOUNTER — Other Ambulatory Visit: Payer: Self-pay | Admitting: Cardiology

## 2019-10-19 DIAGNOSIS — R5383 Other fatigue: Secondary | ICD-10-CM | POA: Diagnosis not present

## 2019-10-19 DIAGNOSIS — Z7189 Other specified counseling: Secondary | ICD-10-CM | POA: Diagnosis not present

## 2019-10-19 DIAGNOSIS — F17210 Nicotine dependence, cigarettes, uncomplicated: Secondary | ICD-10-CM | POA: Diagnosis not present

## 2019-10-19 DIAGNOSIS — Z299 Encounter for prophylactic measures, unspecified: Secondary | ICD-10-CM | POA: Diagnosis not present

## 2019-10-19 DIAGNOSIS — Z79899 Other long term (current) drug therapy: Secondary | ICD-10-CM | POA: Diagnosis not present

## 2019-10-19 DIAGNOSIS — Z0000 Encounter for general adult medical examination without abnormal findings: Secondary | ICD-10-CM | POA: Diagnosis not present

## 2019-10-19 DIAGNOSIS — E559 Vitamin D deficiency, unspecified: Secondary | ICD-10-CM | POA: Diagnosis not present

## 2019-10-26 DIAGNOSIS — G4733 Obstructive sleep apnea (adult) (pediatric): Secondary | ICD-10-CM | POA: Diagnosis not present

## 2019-11-08 DIAGNOSIS — M797 Fibromyalgia: Secondary | ICD-10-CM | POA: Diagnosis not present

## 2019-11-08 DIAGNOSIS — M329 Systemic lupus erythematosus, unspecified: Secondary | ICD-10-CM | POA: Diagnosis not present

## 2019-11-08 DIAGNOSIS — R21 Rash and other nonspecific skin eruption: Secondary | ICD-10-CM | POA: Diagnosis not present

## 2019-12-02 DIAGNOSIS — M3500 Sicca syndrome, unspecified: Secondary | ICD-10-CM | POA: Diagnosis not present

## 2019-12-02 DIAGNOSIS — I5032 Chronic diastolic (congestive) heart failure: Secondary | ICD-10-CM | POA: Diagnosis not present

## 2019-12-02 DIAGNOSIS — I10 Essential (primary) hypertension: Secondary | ICD-10-CM | POA: Diagnosis not present

## 2019-12-02 DIAGNOSIS — Z299 Encounter for prophylactic measures, unspecified: Secondary | ICD-10-CM | POA: Diagnosis not present

## 2019-12-13 IMAGING — US US EXTREM LOW VENOUS*R*
1 series · 14 of 24 positions shown · non-contrast
Comparison: None.

CLINICAL DATA: Right lower extremity pain and edema for 1 month

EXAM:
RIGHT LOWER EXTREMITY VENOUS DUPLEX ULTRASOUND
TECHNIQUE: Doppler venous assessment of the right lower extremity deep venous
system was performed, including characterization of spectral flow,
compressibility, and phasicity.

[Series 1: us extrem low venous*right* · 0.08mm/px · 14 of 37 slices shown]
[im 1/37]
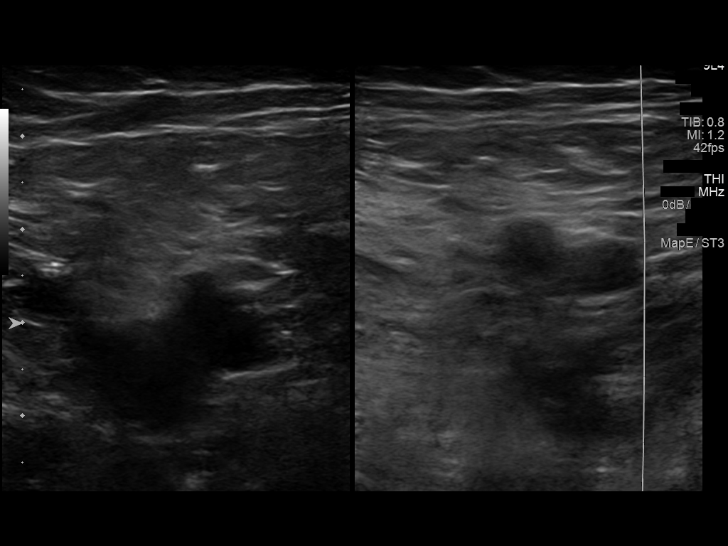
[im 4/37]
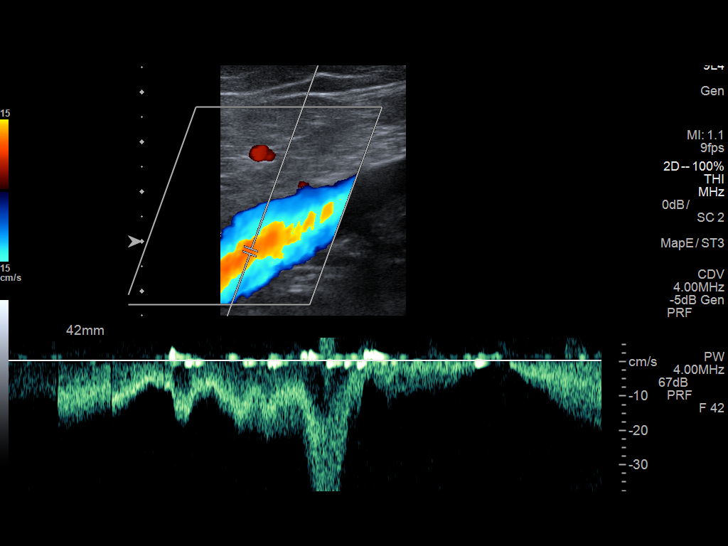
[im 7/37]
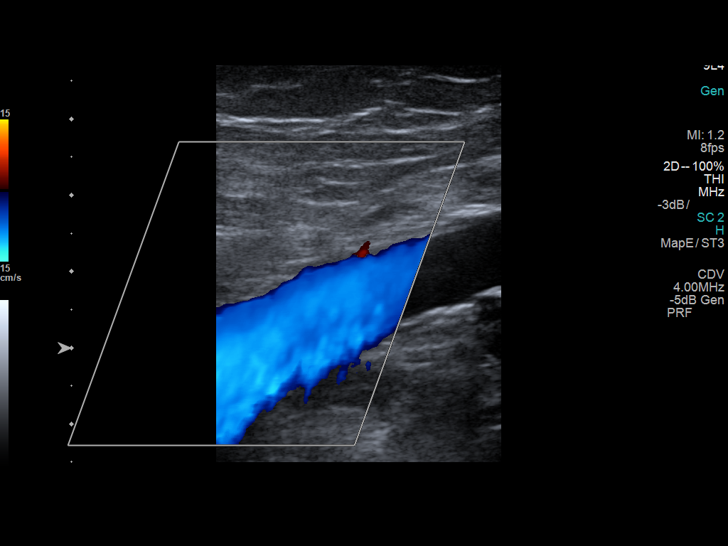
[im 10/37]
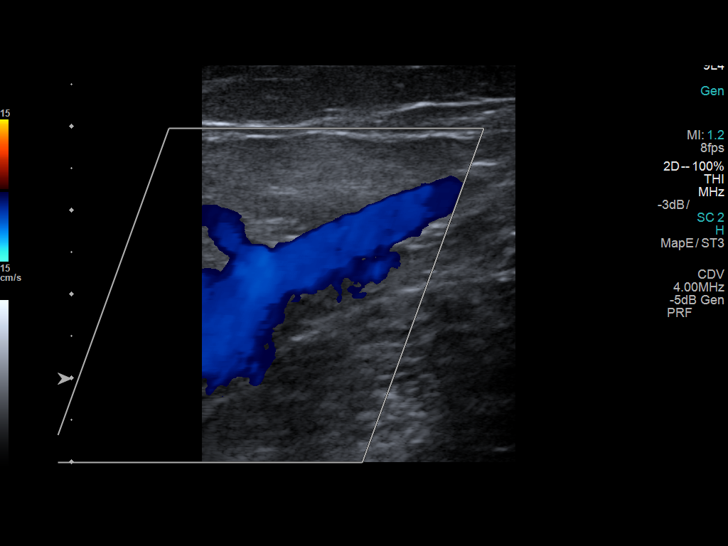
[im 11/37]
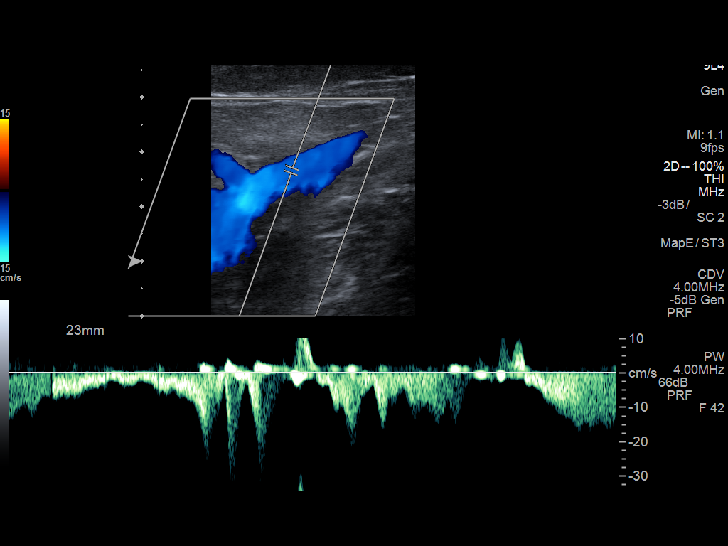
[im 15/37]
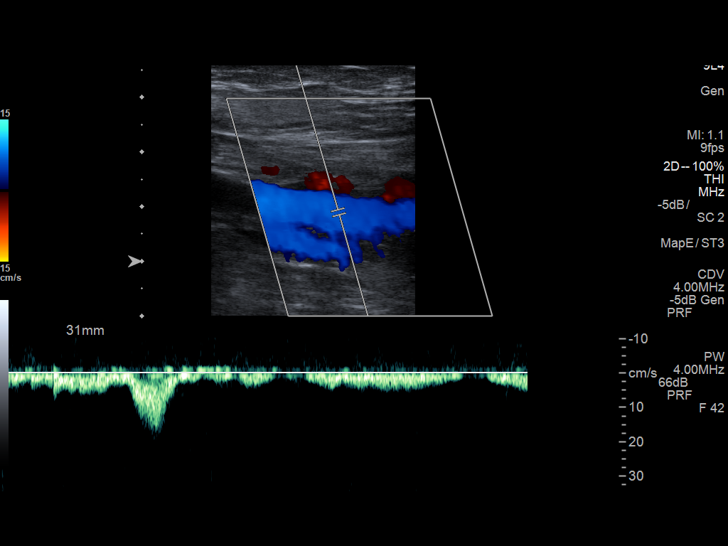
[im 18/37]
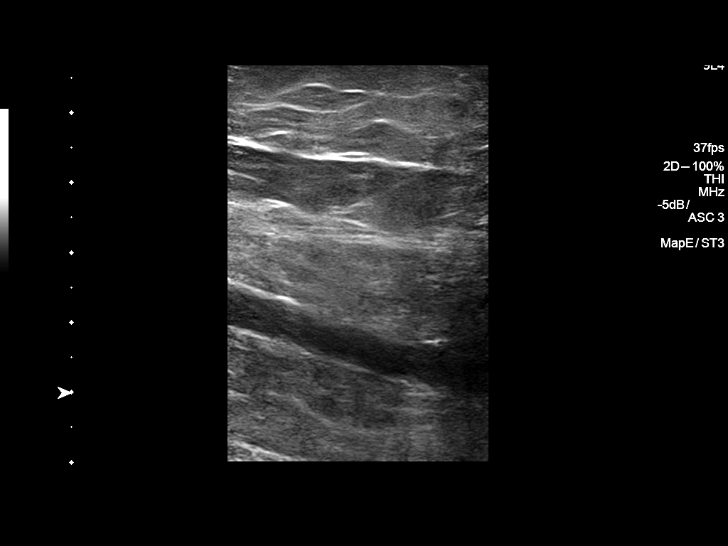
[im 19/37]
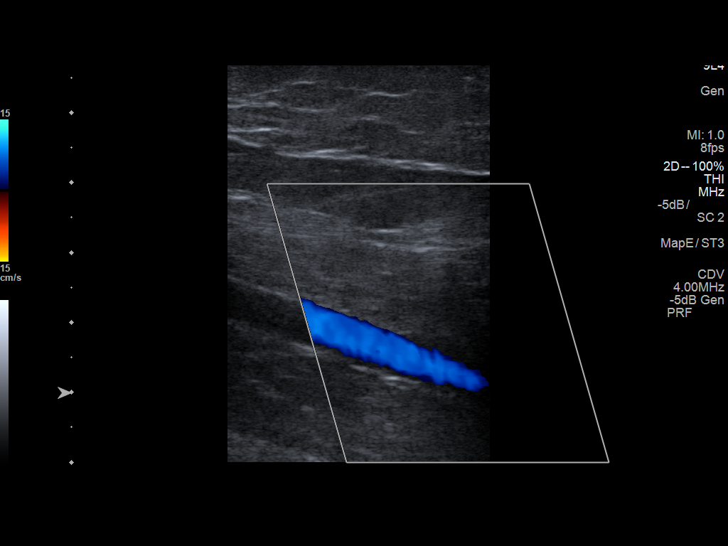
[im 22/37]
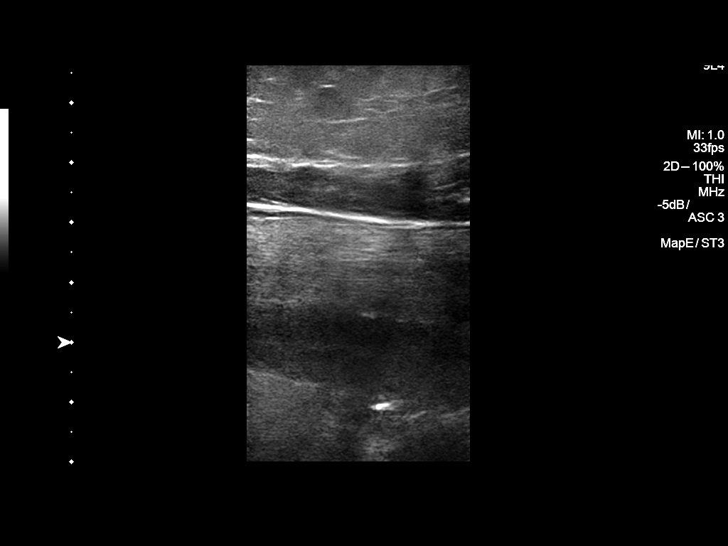
[im 26/37]
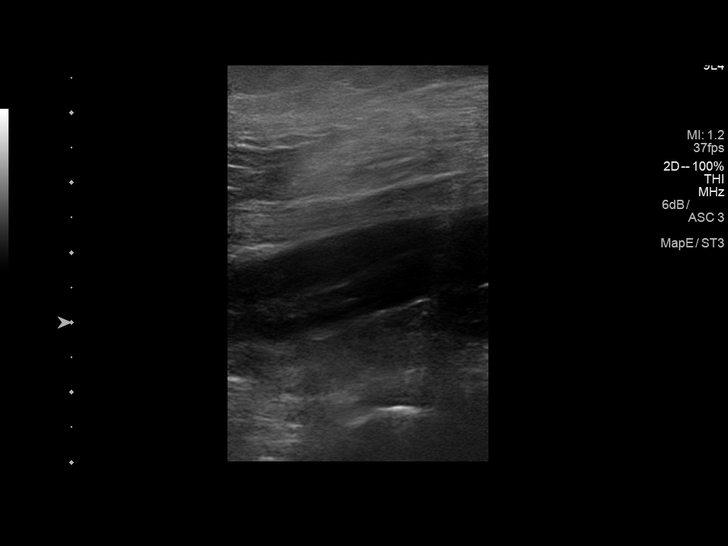
[im 29/37]
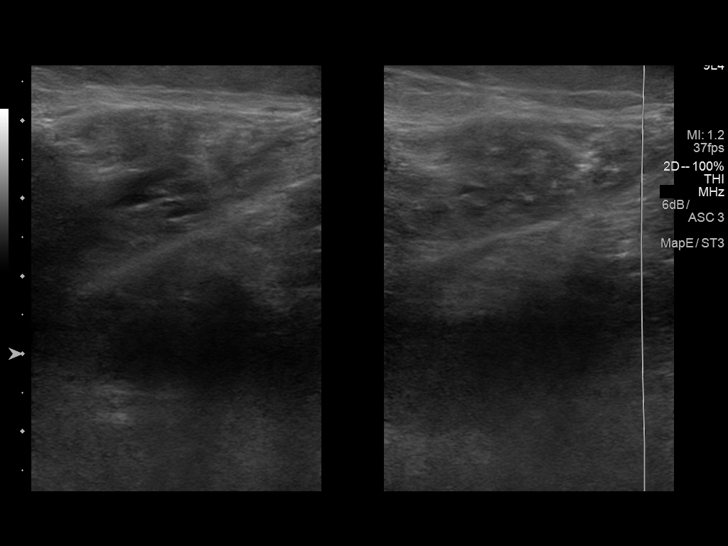
[im 30/37]
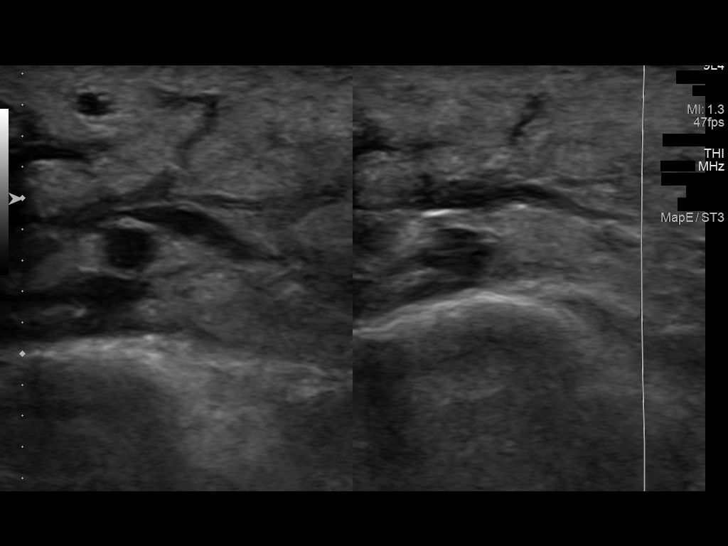
[im 33/37]
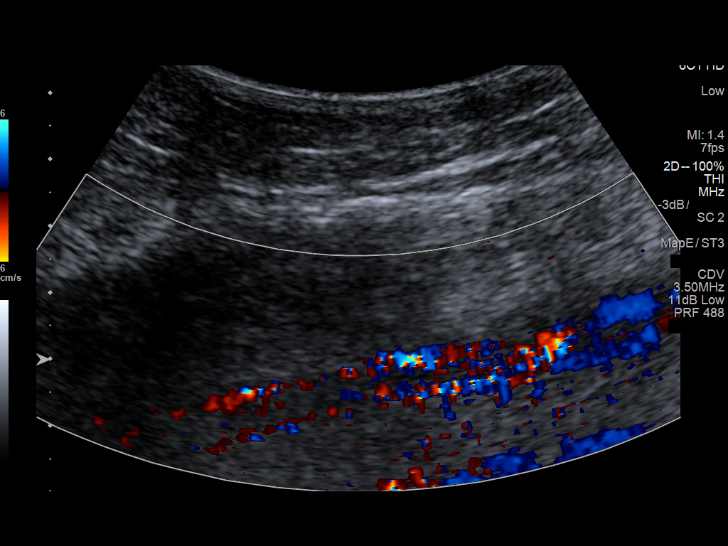
[im 37/37]
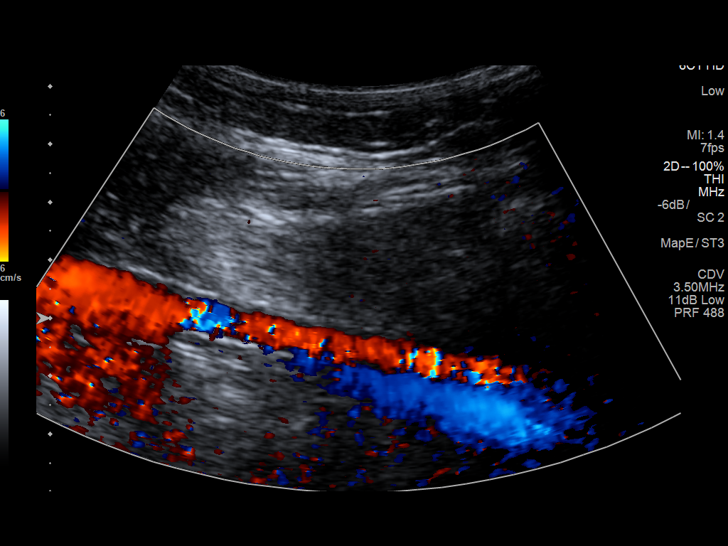

[14 of 24 positions shown; findings below may reference images not displayed]

FINDINGS: There is complete compressibility of the right common femoral,
femoral, and popliteal veins. Doppler analysis demonstrates
respiratory phasicity and augmentation of flow with calf
compression. No obvious superficial vein or calf vein thrombosis.
IMPRESSION: No evidence of right lower extremity DVT.

## 2019-12-27 ENCOUNTER — Ambulatory Visit: Payer: Medicare Other | Admitting: Cardiology

## 2020-01-13 DIAGNOSIS — M1710 Unilateral primary osteoarthritis, unspecified knee: Secondary | ICD-10-CM | POA: Diagnosis not present

## 2020-01-13 DIAGNOSIS — I110 Hypertensive heart disease with heart failure: Secondary | ICD-10-CM | POA: Diagnosis not present

## 2020-01-13 DIAGNOSIS — I5032 Chronic diastolic (congestive) heart failure: Secondary | ICD-10-CM | POA: Diagnosis not present

## 2020-01-19 ENCOUNTER — Ambulatory Visit: Payer: Medicare Other | Admitting: Cardiology

## 2020-02-23 ENCOUNTER — Ambulatory Visit: Payer: Medicare Other | Admitting: Cardiology

## 2020-02-23 ENCOUNTER — Encounter: Payer: Self-pay | Admitting: Cardiology

## 2020-02-23 ENCOUNTER — Other Ambulatory Visit: Payer: Self-pay

## 2020-02-23 VITALS — BP 128/80 | HR 78 | Ht 65.0 in | Wt 266.6 lb

## 2020-02-23 DIAGNOSIS — I272 Pulmonary hypertension, unspecified: Secondary | ICD-10-CM

## 2020-02-23 DIAGNOSIS — G4733 Obstructive sleep apnea (adult) (pediatric): Secondary | ICD-10-CM

## 2020-02-23 DIAGNOSIS — R0600 Dyspnea, unspecified: Secondary | ICD-10-CM

## 2020-02-23 DIAGNOSIS — I10 Essential (primary) hypertension: Secondary | ICD-10-CM | POA: Diagnosis not present

## 2020-02-23 DIAGNOSIS — R0609 Other forms of dyspnea: Secondary | ICD-10-CM

## 2020-02-23 DIAGNOSIS — Z9989 Dependence on other enabling machines and devices: Secondary | ICD-10-CM

## 2020-02-23 NOTE — Addendum Note (Signed)
Addended by: Theresia Majors on: 02/23/2020 10:08 AM   Modules accepted: Orders

## 2020-02-23 NOTE — Progress Notes (Signed)
Date:  02/23/2020   ID:  Yittel Emrich, DOB March 07, 1961, MRN [id number]  PCP:  Kirstie Peri, MD  Cardiologist:  Armanda Magic, MD  Electrophysiologist:  None   Chief Complaint:  CHF, pulmonary HTN, HTN, lupus, OSA  History of Present Illness:    Kylie [name] is a 59 y.o. female with a hx of Chronic diastolic CHF, pulmonary HTN, obesity lupus, OSA on PAP who has been having problems with DOE and LE edema over the past few months.  It did not respond to diuretics and she underwent 2D echo showing normal LVF with normal BNP.  It was felt that her SOB and LE edema were related to obesity and OSA.  Heart cath showed normal right heart pressures.  Her diuretics were stopped.  Lexiscan myoview showed no ischemia.    She is here today for followup and is doing well.  She still has intermittent DOE and chest discomfort that she has always had with no change.  She denies any PND, orthopnea, LE edema, dizziness (except with coughing), palpitations or syncope.   She is compliant with her meds and is tolerating meds with no SE.  She is doing well with her CPAP device and thinks that she has gotten used to it.  She tolerates the mask and feels the pressure is adequate.  Since going on CPAP she feels rested in the am and has no significant daytime sleepiness.  She has some mouth dryness at times.  She does not think that she snores.     Prior CV studies:   The following studies were reviewed today:  PAP compliance download  Past Medical History:  Diagnosis Date  . Fibromyalgia   . Hearing loss   . Hypertension   . Obesity (BMI 30-39.9) 04/14/2014  . OSA (obstructive sleep apnea) 02/07/2015   Severe with AHI 36/hr now on CPAP at 10cm H2O  . Pulmonary HTN (HCC) 04/14/2014   Resolved by 2D echo 2020  . Sinus tachycardia 04/14/2014  . SLE (systemic lupus erythematosus) (HCC)   . Tobacco use    Past Surgical History:  Procedure Laterality Date  . CESAREAN SECTION    . CHOLECYSTECTOMY    .  COLONOSCOPY N/A 07/09/2016   Procedure: COLONOSCOPY;  Surgeon: West Bali, MD;  [facility];  Service: Endoscopy;  Laterality: N/A;  9:00am  . RIGHT HEART CATH N/A 07/26/2018   Procedure: RIGHT HEART CATH;  Surgeon: Dolores Patty, MD;  [facility];  Service: Cardiovascular;  Laterality: N/A;  . RIGHT HEART CATHETERIZATION N/A 05/31/2014   Procedure: RIGHT HEART CATH;  Surgeon: Laurey Morale, MD;  Location: [facility] CATH LAB;  Service: Cardiovascular;  Laterality: N/A;     Current Meds  Medication Sig  . acetaminophen (TYLENOL) 500 MG tablet Take 500 mg by mouth as needed for moderate pain.   . Ascorbic Acid (VITAMIN C PO) Take 1 tablet by mouth daily.  . Calcium Carbonate-Vitamin D (CALCIUM PLUS VITAMIN D PO) Take 1 tablet by mouth daily. Takes with Milk Chocolate  . chlorzoxazone (PARAFON) 500 MG tablet Take 500 mg by mouth 4 (four) times daily as needed for muscle spasms.  . Cholecalciferol (VITAMIN D3 PO) Take 1 tablet by mouth daily.  Marland Kitchen HYDROcodone-acetaminophen (NORCO/VICODIN) 5-325 MG tablet Take 1-2 tablets by mouth as needed.   . hydroxychloroquine (PLAQUENIL) 200 MG tablet Take 400 mg by mouth daily.  Marland Kitchen lisinopril (ZESTRIL) 30 MG tablet TAKE 1 TABLET DAILY  .  metoprolol succinate (TOPROL-XL) 25 MG 24 hr tablet TAKE 1/2 TABLET BY MOUTH EVERY DAY  . Omega-3 Fatty Acids (FISH OIL PO) Take 1 tablet by mouth daily.  Marland Kitchen PARoxetine (PAXIL) 40 MG tablet Take 40 mg by mouth every evening.   . traZODone (DESYREL) 100 MG tablet Take 100 mg by mouth at bedtime.  Marland Kitchen VITAMIN E PO Take 1 tablet by mouth daily.     Allergies:   Ibuprofen, Amoxicillin, and Sulfa antibiotics   Social History   Tobacco Use  . Smoking status: Current Every Day Smoker    Packs/day: 0.25    Years: 5.00    Pack years: 1.25    Types: Cigarettes  . Smokeless tobacco: Never Used  Vaping Use  . Vaping Use: Never used  Substance Use Topics  . Alcohol use: No  . Drug use: No      Family Hx: The patient's family history includes Coronary artery disease (age of onset: 41) in her father; Lupus in her mother.  ROS:   Please see the history of present illness.     All other systems reviewed and are negative.   Labs/Other Tests and Data Reviewed:    Recent Labs: 06/14/2019: ALT 21; BUN 13; Creatinine, Ser 0.83; Hemoglobin 14.2; Platelets 202; Potassium 4.3; Sodium 138; TSH 2.340   Recent Lipid Panel Lab Results  Component Value Date/Time   CHOL 133 06/14/2019 11:07 AM   TRIG 97 06/14/2019 11:07 AM   HDL 47 06/14/2019 11:07 AM   CHOLHDL 2.8 06/14/2019 11:07 AM   LDLCALC 68 06/14/2019 11:07 AM    Wt Readings from Last 3 Encounters:  02/23/20 266 lb 9.6 oz (120.9 kg)  06/14/19 259 lb 1.9 oz (117.5 kg)  12/15/18 272 lb 12.8 oz (123.7 kg)     Objective:    Vital Signs:  BP 128/80   Pulse 78   Ht 5\' 5"  (1.651 m)   Wt 266 lb 9.6 oz (120.9 kg)   SpO2 94%   BMI 44.36 kg/m    GEN: Well nourished, well developed in no acute distress HEENT: Normal NECK: No JVD; No carotid bruits LYMPHATICS: No lymphadenopathy CARDIAC:RRR, no murmurs, rubs, gallops RESPIRATORY:  Clear to auscultation without rales, wheezing or rhonchi  ABDOMEN: Soft, non-tender, non-distended MUSCULOSKELETAL:  No edema; No deformity  SKIN: Warm and dry NEUROLOGIC:  Alert and oriented x 3 PSYCHIATRIC:  Normal affect    ASSESSMENT & PLAN:    1.  Chronic Shortness of breath  - 2D echo showed normal LV function and diastolic function.   -RHC showed normal right heart pressures.  - It is felt that her shortness of breath is more related to underlying morbid obesity as well as obstructive sleep apnea and possibly GERD.  - Lexiscan Myoview showed no ischemia. -she still has DOE and mild chest discomfort at times but nothing different from before  2.  Pulmonary hypertension  -this was noted in the past on echo but recent right heart cath showed normal right heart pressures.  3.   Morbid obesity  - continued to encouraged her to get into a routine exercise program and cut back on carbs and portions.   -she knows that she needs to lose weight -I will refer her to healthy weight and wellness at [facility]  4.  Hypertension  -Bp controlled on exam today -continue Toprol XL 12.5mg  daily and Lisinopril 30mg  daily -SCr was stable at 0.81 and K+ 4.3 in Aug 2021  5.  OSA - The  patient is tolerating PAP therapy well without any problems. The PAP download was reviewed today and showed an AHI of 1/hr on 10 cm H2O with 100% compliance in using more than 4 hours nightly.  The patient has been using and benefiting from PAP use and will continue to benefit from therapy.    Medication Adjustments/Labs and Tests Ordered: Current medicines are reviewed at length with the patient today.  Concerns regarding medicines are outlined above.  Tests Ordered: No orders of the defined types were placed in this encounter.  Medication Changes: No orders of the defined types were placed in this encounter.   Disposition:  Follow up in 4 weeks with PA and with me in 1 year  Signed, Armanda Magic, MD  02/23/2020 9:58 AM    [facility]

## 2020-02-23 NOTE — Patient Instructions (Signed)
Medication Instructions:  Your physician recommends that you continue on your current medications as directed. Please refer to the Current Medication list given to you today.  *If you need a refill on your cardiac medications before your next appointment, please call your pharmacy*  Follow-Up: At [facility], you and your health needs are our priority.  As part of our continuing mission to provide you with exceptional heart care, we have created designated Provider Care Teams.  These Care Teams include your primary Cardiologist (physician) and Advanced Practice Providers (APPs -  Physician Assistants and Nurse Practitioners) who all work together to provide you with the care you need, when you need it.  Your next appointment:   1 year(s)  The format for your next appointment:   In Person  Provider:   You may see Armanda Magic, MD or one of the following Advanced Practice Providers on your designated Care Team:    Ronie Spies, PA-C  Jacolyn Reedy, PA-C  Other Instructions You have been referred to the [facility]. Someone from their office will contact you to set up an appointment.

## 2020-02-29 IMAGING — CR CHEST - 2 VIEW
2 series · 2 of 2 positions shown · non-contrast
Comparison: None.

CLINICAL DATA: Short of breath

EXAM:
CHEST - 2 VIEW

[chest pa]
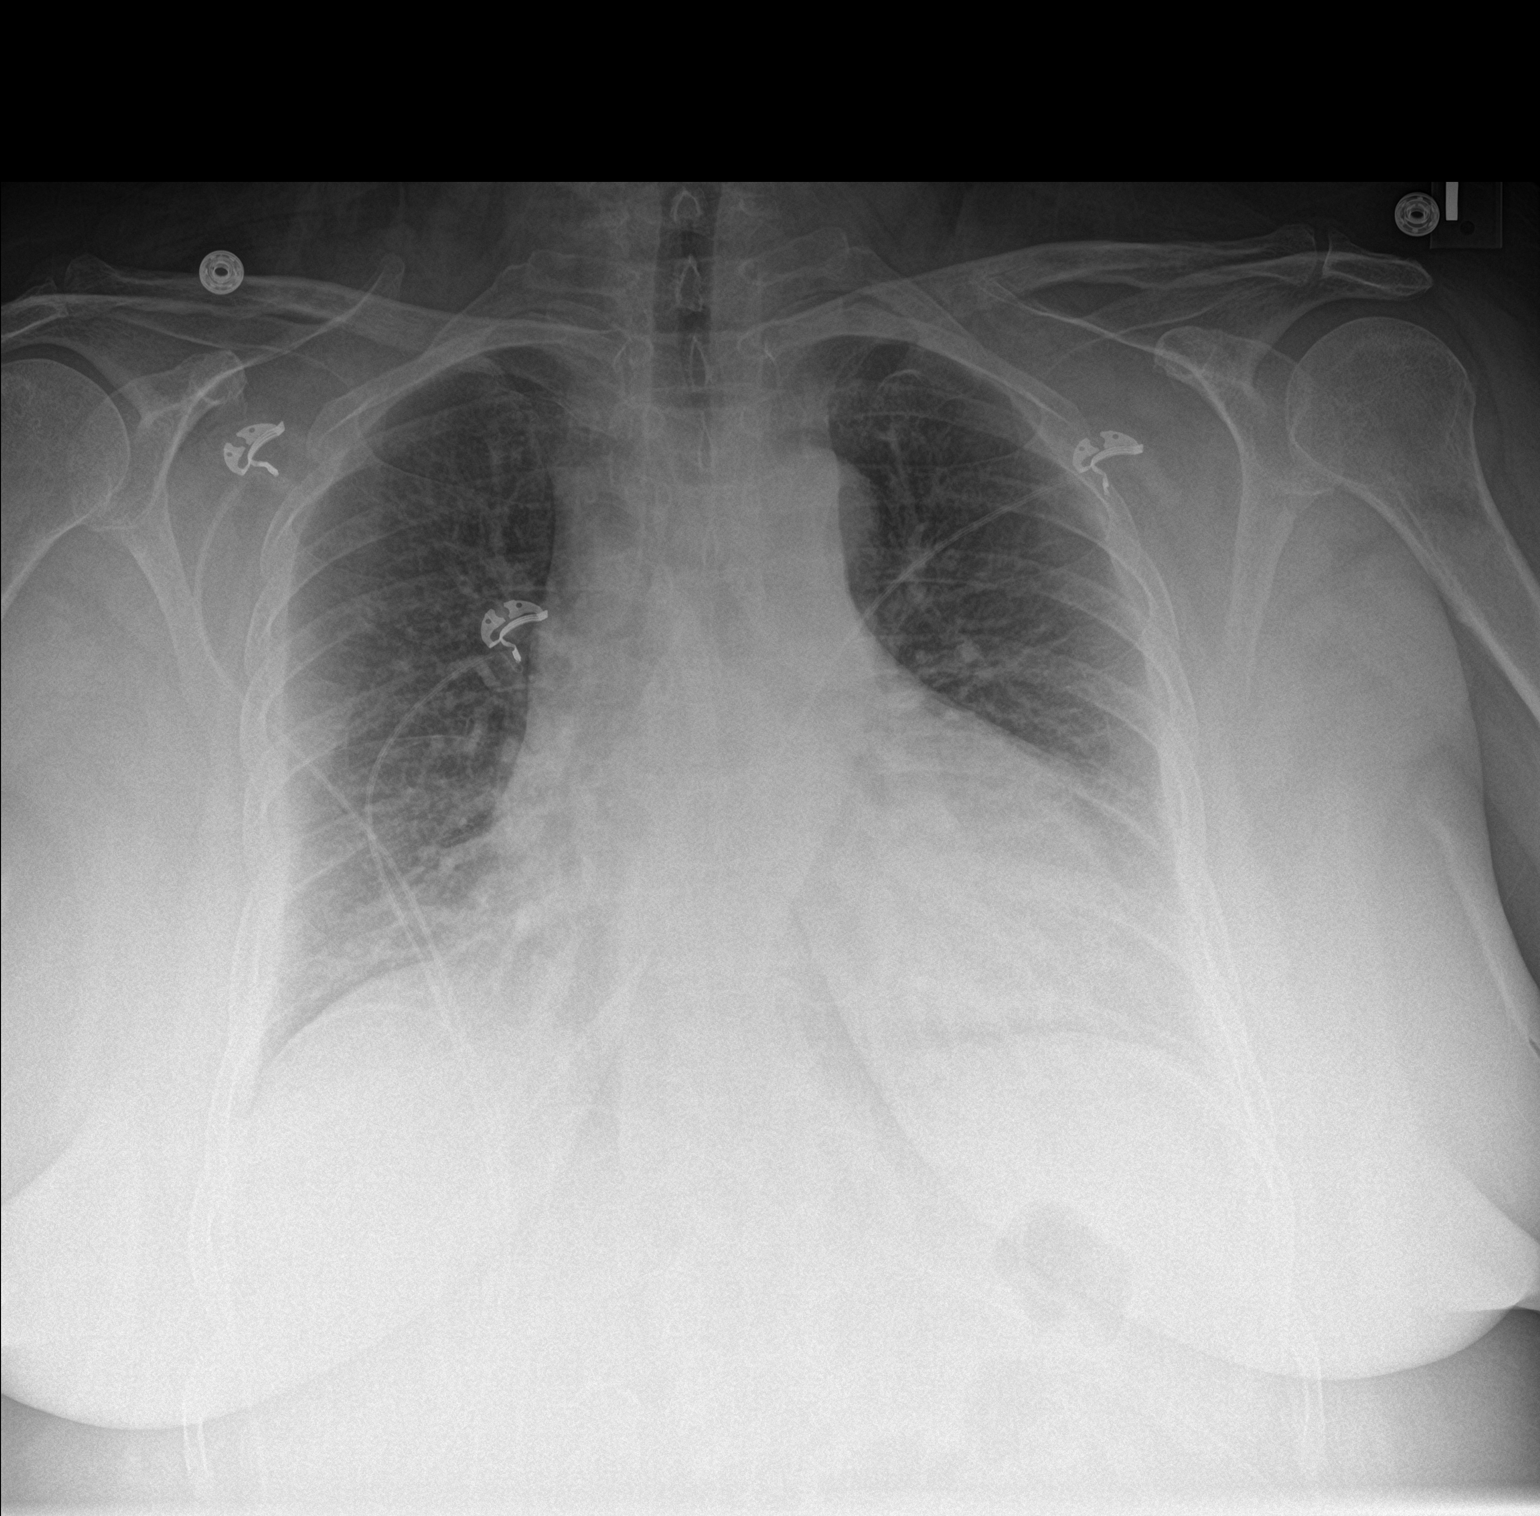

[chest lat]
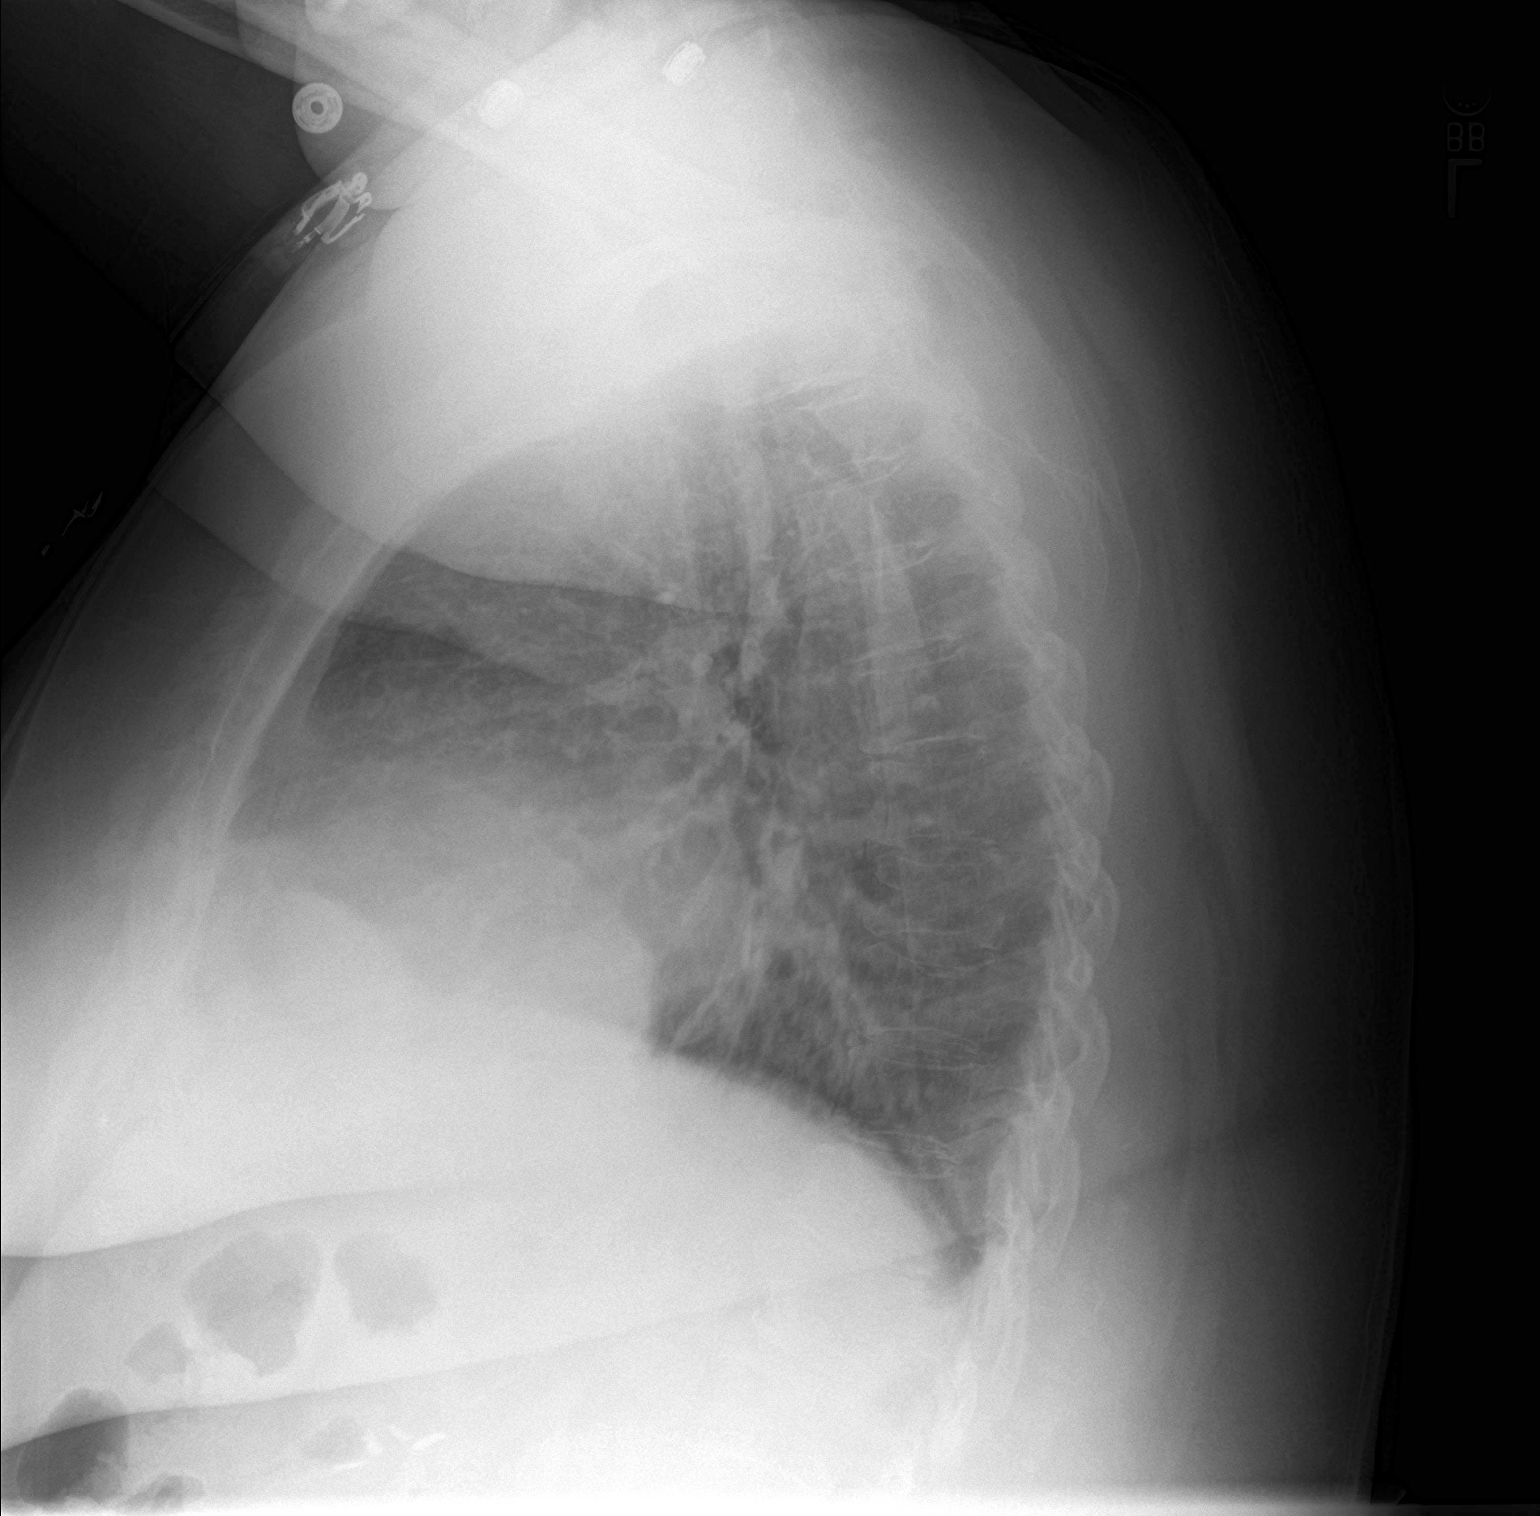

[2 of 2 positions shown; findings below may reference images not displayed]

FINDINGS: Cardiac silhouette is enlarged. No effusion, infiltrate or
pneumothorax. No acute osseous abnormality.
IMPRESSION: Cardiomegaly.  No acute findings.

## 2020-03-01 DIAGNOSIS — G4733 Obstructive sleep apnea (adult) (pediatric): Secondary | ICD-10-CM | POA: Diagnosis not present

## 2020-03-05 DIAGNOSIS — I10 Essential (primary) hypertension: Secondary | ICD-10-CM | POA: Diagnosis not present

## 2020-03-05 DIAGNOSIS — R059 Cough, unspecified: Secondary | ICD-10-CM | POA: Diagnosis not present

## 2020-03-05 DIAGNOSIS — R350 Frequency of micturition: Secondary | ICD-10-CM | POA: Diagnosis not present

## 2020-03-05 DIAGNOSIS — Z299 Encounter for prophylactic measures, unspecified: Secondary | ICD-10-CM | POA: Diagnosis not present

## 2020-03-28 DIAGNOSIS — R059 Cough, unspecified: Secondary | ICD-10-CM | POA: Diagnosis not present

## 2020-03-28 DIAGNOSIS — J019 Acute sinusitis, unspecified: Secondary | ICD-10-CM | POA: Diagnosis not present

## 2020-03-28 DIAGNOSIS — I5032 Chronic diastolic (congestive) heart failure: Secondary | ICD-10-CM | POA: Diagnosis not present

## 2020-03-28 DIAGNOSIS — F17210 Nicotine dependence, cigarettes, uncomplicated: Secondary | ICD-10-CM | POA: Diagnosis not present

## 2020-03-28 DIAGNOSIS — Z299 Encounter for prophylactic measures, unspecified: Secondary | ICD-10-CM | POA: Diagnosis not present

## 2020-04-16 DIAGNOSIS — M1710 Unilateral primary osteoarthritis, unspecified knee: Secondary | ICD-10-CM | POA: Diagnosis not present

## 2020-04-16 DIAGNOSIS — I5032 Chronic diastolic (congestive) heart failure: Secondary | ICD-10-CM | POA: Diagnosis not present

## 2020-04-16 DIAGNOSIS — I110 Hypertensive heart disease with heart failure: Secondary | ICD-10-CM | POA: Diagnosis not present

## 2020-04-25 DIAGNOSIS — Z299 Encounter for prophylactic measures, unspecified: Secondary | ICD-10-CM | POA: Diagnosis not present

## 2020-04-25 DIAGNOSIS — I10 Essential (primary) hypertension: Secondary | ICD-10-CM | POA: Diagnosis not present

## 2020-04-25 DIAGNOSIS — F17210 Nicotine dependence, cigarettes, uncomplicated: Secondary | ICD-10-CM | POA: Diagnosis not present

## 2020-04-25 DIAGNOSIS — J449 Chronic obstructive pulmonary disease, unspecified: Secondary | ICD-10-CM | POA: Diagnosis not present

## 2020-04-25 DIAGNOSIS — R059 Cough, unspecified: Secondary | ICD-10-CM | POA: Diagnosis not present

## 2020-04-25 DIAGNOSIS — I5032 Chronic diastolic (congestive) heart failure: Secondary | ICD-10-CM | POA: Diagnosis not present

## 2020-04-27 ENCOUNTER — Other Ambulatory Visit: Payer: Self-pay | Admitting: Cardiology

## 2020-05-10 DIAGNOSIS — M329 Systemic lupus erythematosus, unspecified: Secondary | ICD-10-CM | POA: Diagnosis not present

## 2020-05-10 DIAGNOSIS — M797 Fibromyalgia: Secondary | ICD-10-CM | POA: Diagnosis not present

## 2020-05-10 DIAGNOSIS — R21 Rash and other nonspecific skin eruption: Secondary | ICD-10-CM | POA: Diagnosis not present

## 2020-05-22 ENCOUNTER — Telehealth: Payer: Self-pay | Admitting: Cardiology

## 2020-05-22 NOTE — Telephone Encounter (Signed)
Pt c/o medication issue:  1. Name of Medication: metoprolol succinate (TOPROL-XL) 25 MG 24 hr tablet  2. How are you currently taking this medication (dosage and times per day)? Has not started  3. Are you having a reaction (difficulty breathing--STAT)? no  4. What is your medication issue? Patient would like to know how she is to take the medication.

## 2020-05-22 NOTE — Telephone Encounter (Signed)
Spoke with the patient who is very confused in regards to her metoprolol. She states that the pharmacy mailed it to her in packets but she has not taken it before. I advised her that she has been on metoprolol succinate 12.5 mg daily for a while now. Patient states that she does not want to receive it through the mail. I advised her that the medication can be sent to her local pharmacy.  Patient is going to call her pharmacy and let me know if she needs it to be sent in.

## 2020-05-30 DIAGNOSIS — G4733 Obstructive sleep apnea (adult) (pediatric): Secondary | ICD-10-CM | POA: Diagnosis not present

## 2020-06-13 DIAGNOSIS — M1710 Unilateral primary osteoarthritis, unspecified knee: Secondary | ICD-10-CM | POA: Diagnosis not present

## 2020-06-13 DIAGNOSIS — I10 Essential (primary) hypertension: Secondary | ICD-10-CM | POA: Diagnosis not present

## 2020-06-13 DIAGNOSIS — I5032 Chronic diastolic (congestive) heart failure: Secondary | ICD-10-CM | POA: Diagnosis not present

## 2020-06-25 DIAGNOSIS — F17210 Nicotine dependence, cigarettes, uncomplicated: Secondary | ICD-10-CM | POA: Diagnosis not present

## 2020-06-25 DIAGNOSIS — I10 Essential (primary) hypertension: Secondary | ICD-10-CM | POA: Diagnosis not present

## 2020-06-25 DIAGNOSIS — J449 Chronic obstructive pulmonary disease, unspecified: Secondary | ICD-10-CM | POA: Diagnosis not present

## 2020-06-25 DIAGNOSIS — Z299 Encounter for prophylactic measures, unspecified: Secondary | ICD-10-CM | POA: Diagnosis not present

## 2020-06-25 DIAGNOSIS — I5032 Chronic diastolic (congestive) heart failure: Secondary | ICD-10-CM | POA: Diagnosis not present

## 2020-06-25 DIAGNOSIS — M3500 Sicca syndrome, unspecified: Secondary | ICD-10-CM | POA: Diagnosis not present

## 2020-07-25 ENCOUNTER — Other Ambulatory Visit: Payer: Self-pay | Admitting: Cardiology

## 2020-08-18 DIAGNOSIS — H04123 Dry eye syndrome of bilateral lacrimal glands: Secondary | ICD-10-CM | POA: Diagnosis not present

## 2020-08-18 DIAGNOSIS — H40033 Anatomical narrow angle, bilateral: Secondary | ICD-10-CM | POA: Diagnosis not present

## 2020-08-29 DIAGNOSIS — G4733 Obstructive sleep apnea (adult) (pediatric): Secondary | ICD-10-CM | POA: Diagnosis not present

## 2020-09-13 DIAGNOSIS — I10 Essential (primary) hypertension: Secondary | ICD-10-CM | POA: Diagnosis not present

## 2020-09-13 DIAGNOSIS — E559 Vitamin D deficiency, unspecified: Secondary | ICD-10-CM | POA: Diagnosis not present

## 2020-09-13 DIAGNOSIS — K219 Gastro-esophageal reflux disease without esophagitis: Secondary | ICD-10-CM | POA: Diagnosis not present

## 2020-10-02 ENCOUNTER — Telehealth: Payer: Self-pay | Admitting: Cardiology

## 2020-10-02 NOTE — Telephone Encounter (Signed)
Patient had called the office to schedule her yearly follow up appointment for December. She wanted to let us know that she has ordered an adjustable bed because she has had some trouble sleeping.

## 2020-10-02 NOTE — Telephone Encounter (Signed)
Pt is calling she has ordered her hospital bed from her insurance

## 2020-10-11 ENCOUNTER — Other Ambulatory Visit: Payer: Self-pay | Admitting: Internal Medicine

## 2020-10-11 DIAGNOSIS — Z139 Encounter for screening, unspecified: Secondary | ICD-10-CM

## 2020-10-23 DIAGNOSIS — D84821 Immunodeficiency due to drugs: Secondary | ICD-10-CM | POA: Diagnosis not present

## 2020-10-23 DIAGNOSIS — Z79899 Other long term (current) drug therapy: Secondary | ICD-10-CM | POA: Diagnosis not present

## 2020-10-23 DIAGNOSIS — Z7189 Other specified counseling: Secondary | ICD-10-CM | POA: Diagnosis not present

## 2020-10-23 DIAGNOSIS — E7800 Pure hypercholesterolemia, unspecified: Secondary | ICD-10-CM | POA: Diagnosis not present

## 2020-10-23 DIAGNOSIS — R5383 Other fatigue: Secondary | ICD-10-CM | POA: Diagnosis not present

## 2020-10-23 DIAGNOSIS — F17210 Nicotine dependence, cigarettes, uncomplicated: Secondary | ICD-10-CM | POA: Diagnosis not present

## 2020-10-23 DIAGNOSIS — Z0000 Encounter for general adult medical examination without abnormal findings: Secondary | ICD-10-CM | POA: Diagnosis not present

## 2020-10-23 DIAGNOSIS — N390 Urinary tract infection, site not specified: Secondary | ICD-10-CM | POA: Diagnosis not present

## 2020-10-23 DIAGNOSIS — Z299 Encounter for prophylactic measures, unspecified: Secondary | ICD-10-CM | POA: Diagnosis not present

## 2020-10-23 DIAGNOSIS — I10 Essential (primary) hypertension: Secondary | ICD-10-CM | POA: Diagnosis not present

## 2020-10-29 DIAGNOSIS — Z299 Encounter for prophylactic measures, unspecified: Secondary | ICD-10-CM | POA: Diagnosis not present

## 2020-10-29 DIAGNOSIS — J441 Chronic obstructive pulmonary disease with (acute) exacerbation: Secondary | ICD-10-CM | POA: Diagnosis not present

## 2020-10-29 DIAGNOSIS — J449 Chronic obstructive pulmonary disease, unspecified: Secondary | ICD-10-CM | POA: Diagnosis not present

## 2020-10-29 DIAGNOSIS — F17210 Nicotine dependence, cigarettes, uncomplicated: Secondary | ICD-10-CM | POA: Diagnosis not present

## 2020-11-07 DIAGNOSIS — M797 Fibromyalgia: Secondary | ICD-10-CM | POA: Diagnosis not present

## 2020-11-07 DIAGNOSIS — R21 Rash and other nonspecific skin eruption: Secondary | ICD-10-CM | POA: Diagnosis not present

## 2020-11-07 DIAGNOSIS — M329 Systemic lupus erythematosus, unspecified: Secondary | ICD-10-CM | POA: Diagnosis not present

## 2020-11-23 DIAGNOSIS — D84821 Immunodeficiency due to drugs: Secondary | ICD-10-CM | POA: Diagnosis not present

## 2020-11-23 DIAGNOSIS — I10 Essential (primary) hypertension: Secondary | ICD-10-CM | POA: Diagnosis not present

## 2020-11-23 DIAGNOSIS — Z299 Encounter for prophylactic measures, unspecified: Secondary | ICD-10-CM | POA: Diagnosis not present

## 2020-11-23 DIAGNOSIS — G4700 Insomnia, unspecified: Secondary | ICD-10-CM | POA: Diagnosis not present

## 2020-11-23 DIAGNOSIS — R350 Frequency of micturition: Secondary | ICD-10-CM | POA: Diagnosis not present

## 2020-11-23 DIAGNOSIS — L259 Unspecified contact dermatitis, unspecified cause: Secondary | ICD-10-CM | POA: Diagnosis not present

## 2020-11-23 DIAGNOSIS — Z79899 Other long term (current) drug therapy: Secondary | ICD-10-CM | POA: Diagnosis not present

## 2020-11-23 DIAGNOSIS — F17210 Nicotine dependence, cigarettes, uncomplicated: Secondary | ICD-10-CM | POA: Diagnosis not present

## 2020-11-29 DIAGNOSIS — M7662 Achilles tendinitis, left leg: Secondary | ICD-10-CM | POA: Diagnosis not present

## 2020-11-29 DIAGNOSIS — M7661 Achilles tendinitis, right leg: Secondary | ICD-10-CM | POA: Diagnosis not present

## 2020-11-30 DIAGNOSIS — G4733 Obstructive sleep apnea (adult) (pediatric): Secondary | ICD-10-CM | POA: Diagnosis not present

## 2020-12-14 DIAGNOSIS — K219 Gastro-esophageal reflux disease without esophagitis: Secondary | ICD-10-CM | POA: Diagnosis not present

## 2020-12-14 DIAGNOSIS — I10 Essential (primary) hypertension: Secondary | ICD-10-CM | POA: Diagnosis not present

## 2021-01-17 DIAGNOSIS — Z713 Dietary counseling and surveillance: Secondary | ICD-10-CM | POA: Diagnosis not present

## 2021-01-17 DIAGNOSIS — I5032 Chronic diastolic (congestive) heart failure: Secondary | ICD-10-CM | POA: Diagnosis not present

## 2021-01-17 DIAGNOSIS — F17210 Nicotine dependence, cigarettes, uncomplicated: Secondary | ICD-10-CM | POA: Diagnosis not present

## 2021-01-17 DIAGNOSIS — Z299 Encounter for prophylactic measures, unspecified: Secondary | ICD-10-CM | POA: Diagnosis not present

## 2021-01-17 DIAGNOSIS — H6691 Otitis media, unspecified, right ear: Secondary | ICD-10-CM | POA: Diagnosis not present

## 2021-01-31 DIAGNOSIS — M7662 Achilles tendinitis, left leg: Secondary | ICD-10-CM | POA: Diagnosis not present

## 2021-01-31 DIAGNOSIS — M7661 Achilles tendinitis, right leg: Secondary | ICD-10-CM | POA: Diagnosis not present

## 2021-02-05 DIAGNOSIS — Z1231 Encounter for screening mammogram for malignant neoplasm of breast: Secondary | ICD-10-CM | POA: Diagnosis not present

## 2021-02-13 ENCOUNTER — Other Ambulatory Visit: Payer: Self-pay | Admitting: Cardiology

## 2021-02-21 DIAGNOSIS — M7662 Achilles tendinitis, left leg: Secondary | ICD-10-CM | POA: Diagnosis not present

## 2021-02-21 DIAGNOSIS — M7661 Achilles tendinitis, right leg: Secondary | ICD-10-CM | POA: Diagnosis not present

## 2021-02-22 ENCOUNTER — Ambulatory Visit: Payer: Medicare Other | Admitting: Cardiology

## 2021-02-28 DIAGNOSIS — G4733 Obstructive sleep apnea (adult) (pediatric): Secondary | ICD-10-CM | POA: Diagnosis not present

## 2021-03-06 DIAGNOSIS — R0789 Other chest pain: Secondary | ICD-10-CM | POA: Diagnosis not present

## 2021-03-06 DIAGNOSIS — F17210 Nicotine dependence, cigarettes, uncomplicated: Secondary | ICD-10-CM | POA: Diagnosis not present

## 2021-03-06 DIAGNOSIS — S299XXA Unspecified injury of thorax, initial encounter: Secondary | ICD-10-CM | POA: Diagnosis not present

## 2021-03-06 DIAGNOSIS — R928 Other abnormal and inconclusive findings on diagnostic imaging of breast: Secondary | ICD-10-CM | POA: Diagnosis not present

## 2021-03-06 DIAGNOSIS — I517 Cardiomegaly: Secondary | ICD-10-CM | POA: Diagnosis not present

## 2021-03-06 DIAGNOSIS — Z299 Encounter for prophylactic measures, unspecified: Secondary | ICD-10-CM | POA: Diagnosis not present

## 2021-03-06 DIAGNOSIS — I10 Essential (primary) hypertension: Secondary | ICD-10-CM | POA: Diagnosis not present

## 2021-03-06 DIAGNOSIS — R922 Inconclusive mammogram: Secondary | ICD-10-CM | POA: Diagnosis not present

## 2021-03-15 DIAGNOSIS — I10 Essential (primary) hypertension: Secondary | ICD-10-CM | POA: Diagnosis not present

## 2021-03-15 DIAGNOSIS — G4700 Insomnia, unspecified: Secondary | ICD-10-CM | POA: Diagnosis not present

## 2021-03-19 DIAGNOSIS — Z299 Encounter for prophylactic measures, unspecified: Secondary | ICD-10-CM | POA: Diagnosis not present

## 2021-03-19 DIAGNOSIS — Z872 Personal history of diseases of the skin and subcutaneous tissue: Secondary | ICD-10-CM | POA: Diagnosis not present

## 2021-03-19 DIAGNOSIS — I10 Essential (primary) hypertension: Secondary | ICD-10-CM | POA: Diagnosis not present

## 2021-03-19 DIAGNOSIS — R0781 Pleurodynia: Secondary | ICD-10-CM | POA: Diagnosis not present

## 2021-03-19 DIAGNOSIS — Z789 Other specified health status: Secondary | ICD-10-CM | POA: Diagnosis not present

## 2021-03-25 DIAGNOSIS — M25512 Pain in left shoulder: Secondary | ICD-10-CM | POA: Diagnosis not present

## 2021-03-25 DIAGNOSIS — F17210 Nicotine dependence, cigarettes, uncomplicated: Secondary | ICD-10-CM | POA: Diagnosis not present

## 2021-03-25 DIAGNOSIS — I5032 Chronic diastolic (congestive) heart failure: Secondary | ICD-10-CM | POA: Diagnosis not present

## 2021-03-25 DIAGNOSIS — J449 Chronic obstructive pulmonary disease, unspecified: Secondary | ICD-10-CM | POA: Diagnosis not present

## 2021-03-25 DIAGNOSIS — I10 Essential (primary) hypertension: Secondary | ICD-10-CM | POA: Diagnosis not present

## 2021-03-25 DIAGNOSIS — Z299 Encounter for prophylactic measures, unspecified: Secondary | ICD-10-CM | POA: Diagnosis not present

## 2021-03-25 DIAGNOSIS — D84821 Immunodeficiency due to drugs: Secondary | ICD-10-CM | POA: Diagnosis not present

## 2021-03-27 DIAGNOSIS — Z299 Encounter for prophylactic measures, unspecified: Secondary | ICD-10-CM | POA: Diagnosis not present

## 2021-03-27 DIAGNOSIS — Z789 Other specified health status: Secondary | ICD-10-CM | POA: Diagnosis not present

## 2021-03-27 DIAGNOSIS — M25512 Pain in left shoulder: Secondary | ICD-10-CM | POA: Diagnosis not present

## 2021-03-27 DIAGNOSIS — M3500 Sicca syndrome, unspecified: Secondary | ICD-10-CM | POA: Diagnosis not present

## 2021-03-27 DIAGNOSIS — I10 Essential (primary) hypertension: Secondary | ICD-10-CM | POA: Diagnosis not present

## 2021-04-01 ENCOUNTER — Telehealth (INDEPENDENT_AMBULATORY_CARE_PROVIDER_SITE_OTHER): Payer: Medicare Other | Admitting: Cardiology

## 2021-04-01 ENCOUNTER — Encounter: Payer: Self-pay | Admitting: Cardiology

## 2021-04-01 ENCOUNTER — Other Ambulatory Visit: Payer: Self-pay

## 2021-04-01 VITALS — BP 120/90 | HR 76 | Ht 65.0 in | Wt 257.0 lb

## 2021-04-01 DIAGNOSIS — I10 Essential (primary) hypertension: Secondary | ICD-10-CM | POA: Diagnosis not present

## 2021-04-01 DIAGNOSIS — R0609 Other forms of dyspnea: Secondary | ICD-10-CM

## 2021-04-01 DIAGNOSIS — R600 Localized edema: Secondary | ICD-10-CM | POA: Diagnosis not present

## 2021-04-01 DIAGNOSIS — G4733 Obstructive sleep apnea (adult) (pediatric): Secondary | ICD-10-CM

## 2021-04-01 DIAGNOSIS — I272 Pulmonary hypertension, unspecified: Secondary | ICD-10-CM

## 2021-04-01 DIAGNOSIS — F17210 Nicotine dependence, cigarettes, uncomplicated: Secondary | ICD-10-CM | POA: Diagnosis not present

## 2021-04-01 DIAGNOSIS — J069 Acute upper respiratory infection, unspecified: Secondary | ICD-10-CM | POA: Diagnosis not present

## 2021-04-01 DIAGNOSIS — Z9989 Dependence on other enabling machines and devices: Secondary | ICD-10-CM | POA: Diagnosis not present

## 2021-04-01 DIAGNOSIS — J441 Chronic obstructive pulmonary disease with (acute) exacerbation: Secondary | ICD-10-CM | POA: Diagnosis not present

## 2021-04-01 DIAGNOSIS — Z299 Encounter for prophylactic measures, unspecified: Secondary | ICD-10-CM | POA: Diagnosis not present

## 2021-04-01 MED ORDER — LISINOPRIL 30 MG PO TABS: 30.0000 mg | ORAL_TABLET | Freq: Every day | ORAL | 3 refills | Status: DC

## 2021-04-01 MED ORDER — METOPROLOL SUCCINATE ER 25 MG PO TB24: 12.5000 mg | ORAL_TABLET | Freq: Every day | ORAL | 3 refills | Status: DC

## 2021-04-01 NOTE — Addendum Note (Signed)
Addended by: Lars Mage on: 04/01/2021 10:11 AM   Modules accepted: Orders

## 2021-04-01 NOTE — Patient Instructions (Signed)
Medication Instructions:  Refills sent for Toprol and Lisinopril  Your physician recommends that you continue on your current medications as directed. Please refer to the Current Medication list given to you today.     Lab Work: BMET scheduled for 2/8  If you have labs (blood work) drawn today and your tests are completely normal, you will receive your results only by: [place] (if you have MyChart) OR A paper copy in the mail If you have any lab test that is abnormal or we need to change your treatment, we will call you to review the results.   Testing/Procedures: NONE   Follow-Up: At [facility], you and your health needs are our priority.  As part of our continuing mission to provide you with exceptional heart care, we have created designated Provider Care Teams.  These Care Teams include your primary Cardiologist (physician) and Advanced Practice Providers (APPs -  Physician Assistants and Nurse Practitioners) who all work together to provide you with the care you need, when you need it.   Your next appointment:   1 year(s)  The format for your next appointment:   In Person  Provider:   Fransico Him, MD

## 2021-04-01 NOTE — Progress Notes (Signed)
Virtual Visit via Video Note   This visit type was conducted due to national recommendations for restrictions regarding the COVID-19 Pandemic (e.g. social distancing) in an effort to limit this patient's exposure and mitigate transmission in our community.  Due to her co-morbid illnesses, this patient is at least at moderate risk for complications without adequate follow up.  This format is felt to be most appropriate for this patient at this time.  All issues noted in this document were discussed and addressed.  A limited physical exam was performed with this format.  Please refer to the patient's chart for her consent to telehealth for [facility].       Date:  04/01/2021   ID:  Kylie [name], DOB [birth year], MRN [id number] The patient was identified using 2 identifiers.  Patient Location: Home Provider Location: Home Office   PCP:  Kirstie Peri, MD   [facility] Providers Cardiologist:  Armanda Magic, MD     Evaluation Performed:  Follow-Up Visit  Chief Complaint:  SOB  History of Present Illness:    Kylie [name] is a 61 y.o. female with a hx of Chronic diastolic CHF, pulmonary HTN, obesity lupus, OSA on PAP who has been having problems with DOE and LE edema over the past few months.  It did not respond to diuretics and she underwent 2D echo showing normal LVF with normal BNP.  It was felt that her SOB and LE edema were related to obesity and OSA.  Heart cath showed normal right heart pressures.  Her diuretics were stopped.  Lexiscan myoview showed no ischemia.    She is here today for followup and is doing well.  She denies any chest pain or pressure, SOB, DOE, PND, orthopnea, dizziness, palpitations or syncope.  She has some mild LE edema over the holidays but that has resolved.  She is compliant with her meds and is tolerating meds with no SE.     She is doing well with her CPAP device and thinks that she has gotten used to it.  She tolerates the mask and feels the  pressure is adequate.  Since going on CPAP she feels rested in the am and has no significant daytime sleepiness.  She denies any significant mouth or nasal dryness or nasal congestion.  She does not think that he snores.     The patient does not have symptoms concerning for COVID-19 infection (fever, chills, cough, or new shortness of breath).    Past Medical History:  Diagnosis Date   Fibromyalgia    Hearing loss    Hypertension    Obesity (BMI 30-39.9) 04/14/2014   OSA (obstructive sleep apnea) 02/07/2015   Severe with AHI 36/hr now on CPAP at 10cm H2O   Pulmonary HTN (HCC) 04/14/2014   Resolved by 2D echo 2020   Sinus tachycardia 04/14/2014   SLE (systemic lupus erythematosus) (HCC)    Tobacco use    Past Surgical History:  Procedure Laterality Date   CESAREAN SECTION     CHOLECYSTECTOMY     COLONOSCOPY N/A 07/09/2016   Procedure: COLONOSCOPY;  Surgeon: West Bali, MD;  [facility];  Service: Endoscopy;  Laterality: N/A;  9:00am   RIGHT HEART CATH N/A 07/26/2018   Procedure: RIGHT HEART CATH;  Surgeon: Dolores Patty, MD;  Location: [facility] INVASIVE CV LAB;  Service: Cardiovascular;  Laterality: N/A;   RIGHT HEART CATHETERIZATION N/A 05/31/2014   Procedure: RIGHT HEART CATH;  Surgeon: Laurey Morale, MD;  Location: [facility] CATH LAB;  Service: Cardiovascular;  Laterality: N/A;     Current Meds  Medication Sig   acetaminophen (TYLENOL) 500 MG tablet Take 500 mg by mouth as needed for moderate pain.    Ascorbic Acid (VITAMIN C PO) Take 1 tablet by mouth daily.   Calcium Carbonate-Vitamin D (CALCIUM PLUS VITAMIN D PO) Take 1 tablet by mouth daily. Takes with Milk Chocolate   chlorzoxazone (PARAFON) 500 MG tablet Take 500 mg by mouth 4 (four) times daily as needed for muscle spasms.   Cholecalciferol (VITAMIN D3 PO) Take 1 tablet by mouth daily.   HYDROcodone-acetaminophen (NORCO/VICODIN) 5-325 MG tablet Take 1-2 tablets by mouth as needed.    hydroxychloroquine  (PLAQUENIL) 200 MG tablet Take 400 mg by mouth daily.   lisinopril (ZESTRIL) 30 MG tablet Take 1 tablet (30 mg total) by mouth daily. Please keep upcoming appt in December 2022 with Dr. Mayford Knife before anymore refills. Thank you   metoprolol succinate (TOPROL-XL) 25 MG 24 hr tablet TAKE 1/2 TABLET BY MOUTH EVERY DAY   Omega-3 Fatty Acids (FISH OIL PO) Take 1 tablet by mouth daily.   PARoxetine (PAXIL) 40 MG tablet Take 40 mg by mouth every evening.    traZODone (DESYREL) 100 MG tablet Take 100 mg by mouth at bedtime.   VITAMIN E PO Take 1 tablet by mouth daily.     Allergies:   Ibuprofen, Amoxicillin, and Sulfa antibiotics   Social History   Tobacco Use   Smoking status: Every Day    Packs/day: 0.25    Years: 5.00    Pack years: 1.25    Types: Cigarettes   Smokeless tobacco: Never  Vaping Use   Vaping Use: Never used  Substance Use Topics   Alcohol use: No   Drug use: No     Family Hx: The patient's family history includes Coronary artery disease (age of onset: 51) in her father; Lupus in her mother.  ROS:   Please see the history of present illness.     All other systems reviewed and are negative.   Prior CV studies:   The following studies were reviewed today:  none  Labs/Other Tests and Data Reviewed:    EKG:  No ECG reviewed.  Recent Labs: No results found for requested labs within last 8760 hours.   Recent Lipid Panel Lab Results  Component Value Date/Time   CHOL 133 06/14/2019 11:07 AM   TRIG 97 06/14/2019 11:07 AM   HDL 47 06/14/2019 11:07 AM   CHOLHDL 2.8 06/14/2019 11:07 AM   LDLCALC 68 06/14/2019 11:07 AM    Wt Readings from Last 3 Encounters:  04/01/21 257 lb (116.6 kg)  02/23/20 266 lb 9.6 oz (120.9 kg)  06/14/19 259 lb 1.9 oz (117.5 kg)     Risk Assessment/Calculations:          Objective:    Vital Signs:  BP 120/90    Pulse 76    Ht 5\' 5"  (1.651 m)    Wt 257 lb (116.6 kg)    BMI 42.77 kg/m    VITAL SIGNS:  reviewed GEN:  no acute  distress EYES:  sclerae anicteric, EOMI - Extraocular Movements Intact RESPIRATORY:  normal respiratory effort, symmetric expansion CARDIOVASCULAR:  no peripheral edema SKIN:  no rash, lesions or ulcers. MUSCULOSKELETAL:  no obvious deformities. NEURO:  alert and oriented x 3, no obvious focal deficit PSYCH:  normal affect  ASSESSMENT & PLAN:    1.  Chronic Shortness of breath  -  2D echo showed normal LV function and diastolic function.   - RHC showed normal right heart pressures 2020 - It is felt that her shortness of breath is more related to underlying morbid obesity as well as obstructive sleep apnea and possibly GERD.  - Lexiscan Myoview showed no ischemia 2020   2.  Pulmonary hypertension  -this was noted in the past on echo but  right heart cath showed normal right heart pressures.   3.  Morbid obesity  - continued to encouraged her to get into a routine exercise program and cut back on carbs and portions.   -she knows that she needs to lose weigh   4.  Hypertension  -Her BP is borderline controlled -Continue prescription drug management with Toprol-XL 12.5 mg daily and lisinopril 30 mg daily with as needed refills  -Check bmet   5.  OSA - The patient is tolerating PAP therapy well without any problems.  The patient has been using and benefiting from PAP use and will continue to benefit from therapy.  -I will get a copy of her PAP compliance download  COVID-19 Education: The signs and symptoms of COVID-19 were discussed with the patient and how to seek care for testing (follow up with PCP or arrange E-visit).  The importance of social distancing was discussed today.  Time:   Today, I have spent 20 minutes with the patient with telehealth technology discussing the above problems.     Medication Adjustments/Labs and Tests Ordered: Current medicines are reviewed at length with the patient today.  Concerns regarding medicines are outlined above.   Tests Ordered: No  orders of the defined types were placed in this encounter.   Medication Changes: No orders of the defined types were placed in this encounter.   Follow Up:  In Person in 1 year(s)  Signed, Armanda Magicraci Jameis Newsham, MD  04/01/2021 9:44 AM    [facility]

## 2021-04-05 DIAGNOSIS — J449 Chronic obstructive pulmonary disease, unspecified: Secondary | ICD-10-CM | POA: Diagnosis not present

## 2021-04-05 DIAGNOSIS — I5032 Chronic diastolic (congestive) heart failure: Secondary | ICD-10-CM | POA: Diagnosis not present

## 2021-04-05 DIAGNOSIS — F17210 Nicotine dependence, cigarettes, uncomplicated: Secondary | ICD-10-CM | POA: Diagnosis not present

## 2021-04-05 DIAGNOSIS — I10 Essential (primary) hypertension: Secondary | ICD-10-CM | POA: Diagnosis not present

## 2021-04-05 DIAGNOSIS — Z299 Encounter for prophylactic measures, unspecified: Secondary | ICD-10-CM | POA: Diagnosis not present

## 2021-04-05 DIAGNOSIS — J069 Acute upper respiratory infection, unspecified: Secondary | ICD-10-CM | POA: Diagnosis not present

## 2021-04-11 DIAGNOSIS — J069 Acute upper respiratory infection, unspecified: Secondary | ICD-10-CM | POA: Diagnosis not present

## 2021-04-11 DIAGNOSIS — F17210 Nicotine dependence, cigarettes, uncomplicated: Secondary | ICD-10-CM | POA: Diagnosis not present

## 2021-04-24 ENCOUNTER — Other Ambulatory Visit: Payer: Self-pay

## 2021-04-24 ENCOUNTER — Other Ambulatory Visit: Payer: Medicare Other | Admitting: *Deleted

## 2021-04-24 DIAGNOSIS — I272 Pulmonary hypertension, unspecified: Secondary | ICD-10-CM

## 2021-04-24 DIAGNOSIS — I10 Essential (primary) hypertension: Secondary | ICD-10-CM | POA: Diagnosis not present

## 2021-04-24 DIAGNOSIS — R600 Localized edema: Secondary | ICD-10-CM

## 2021-04-24 LAB — BASIC METABOLIC PANEL
BUN/Creatinine Ratio: 13 (ref 12–28)
BUN: 9 mg/dL (ref 8–27)
CO2: 27 mmol/L (ref 20–29)
Calcium: 9.3 mg/dL (ref 8.7–10.3)
Chloride: 102 mmol/L (ref 96–106)
Creatinine, Ser: 0.72 mg/dL (ref 0.57–1.00)
Glucose: 90 mg/dL (ref 70–99)
Potassium: 4.2 mmol/L (ref 3.5–5.2)
Sodium: 141 mmol/L (ref 134–144)
eGFR: 95 mL/min/{1.73_m2} (ref >59)

## 2021-05-02 DIAGNOSIS — F17210 Nicotine dependence, cigarettes, uncomplicated: Secondary | ICD-10-CM | POA: Diagnosis not present

## 2021-05-02 DIAGNOSIS — Z299 Encounter for prophylactic measures, unspecified: Secondary | ICD-10-CM | POA: Diagnosis not present

## 2021-05-02 DIAGNOSIS — I10 Essential (primary) hypertension: Secondary | ICD-10-CM | POA: Diagnosis not present

## 2021-05-02 DIAGNOSIS — K2970 Gastritis, unspecified, without bleeding: Secondary | ICD-10-CM | POA: Diagnosis not present

## 2021-05-02 DIAGNOSIS — I5032 Chronic diastolic (congestive) heart failure: Secondary | ICD-10-CM | POA: Diagnosis not present

## 2021-05-10 DIAGNOSIS — M329 Systemic lupus erythematosus, unspecified: Secondary | ICD-10-CM | POA: Diagnosis not present

## 2021-05-10 DIAGNOSIS — R21 Rash and other nonspecific skin eruption: Secondary | ICD-10-CM | POA: Diagnosis not present

## 2021-05-10 DIAGNOSIS — M797 Fibromyalgia: Secondary | ICD-10-CM | POA: Diagnosis not present

## 2021-05-24 DIAGNOSIS — I10 Essential (primary) hypertension: Secondary | ICD-10-CM | POA: Diagnosis not present

## 2021-05-24 DIAGNOSIS — M329 Systemic lupus erythematosus, unspecified: Secondary | ICD-10-CM | POA: Diagnosis not present

## 2021-05-24 DIAGNOSIS — I5032 Chronic diastolic (congestive) heart failure: Secondary | ICD-10-CM | POA: Diagnosis not present

## 2021-05-24 DIAGNOSIS — Z299 Encounter for prophylactic measures, unspecified: Secondary | ICD-10-CM | POA: Diagnosis not present

## 2021-05-24 DIAGNOSIS — F17210 Nicotine dependence, cigarettes, uncomplicated: Secondary | ICD-10-CM | POA: Diagnosis not present

## 2021-05-29 DIAGNOSIS — G4733 Obstructive sleep apnea (adult) (pediatric): Secondary | ICD-10-CM | POA: Diagnosis not present

## 2021-06-13 DIAGNOSIS — G4700 Insomnia, unspecified: Secondary | ICD-10-CM | POA: Diagnosis not present

## 2021-06-13 DIAGNOSIS — I10 Essential (primary) hypertension: Secondary | ICD-10-CM | POA: Diagnosis not present

## 2021-07-17 DIAGNOSIS — Z299 Encounter for prophylactic measures, unspecified: Secondary | ICD-10-CM | POA: Diagnosis not present

## 2021-07-17 DIAGNOSIS — I10 Essential (primary) hypertension: Secondary | ICD-10-CM | POA: Diagnosis not present

## 2021-07-17 DIAGNOSIS — I5032 Chronic diastolic (congestive) heart failure: Secondary | ICD-10-CM | POA: Diagnosis not present

## 2021-07-17 DIAGNOSIS — R600 Localized edema: Secondary | ICD-10-CM | POA: Diagnosis not present

## 2021-07-24 DIAGNOSIS — M329 Systemic lupus erythematosus, unspecified: Secondary | ICD-10-CM | POA: Diagnosis not present

## 2021-07-24 DIAGNOSIS — I10 Essential (primary) hypertension: Secondary | ICD-10-CM | POA: Diagnosis not present

## 2021-07-24 DIAGNOSIS — M3500 Sicca syndrome, unspecified: Secondary | ICD-10-CM | POA: Diagnosis not present

## 2021-07-24 DIAGNOSIS — I5032 Chronic diastolic (congestive) heart failure: Secondary | ICD-10-CM | POA: Diagnosis not present

## 2021-07-24 DIAGNOSIS — F17210 Nicotine dependence, cigarettes, uncomplicated: Secondary | ICD-10-CM | POA: Diagnosis not present

## 2021-07-24 DIAGNOSIS — Z299 Encounter for prophylactic measures, unspecified: Secondary | ICD-10-CM | POA: Diagnosis not present

## 2021-08-27 DIAGNOSIS — G4733 Obstructive sleep apnea (adult) (pediatric): Secondary | ICD-10-CM | POA: Diagnosis not present

## 2021-08-29 DIAGNOSIS — M797 Fibromyalgia: Secondary | ICD-10-CM | POA: Diagnosis not present

## 2021-08-29 DIAGNOSIS — R21 Rash and other nonspecific skin eruption: Secondary | ICD-10-CM | POA: Diagnosis not present

## 2021-08-29 DIAGNOSIS — M329 Systemic lupus erythematosus, unspecified: Secondary | ICD-10-CM | POA: Diagnosis not present

## 2021-10-09 DIAGNOSIS — I10 Essential (primary) hypertension: Secondary | ICD-10-CM | POA: Diagnosis not present

## 2021-10-09 DIAGNOSIS — F17210 Nicotine dependence, cigarettes, uncomplicated: Secondary | ICD-10-CM | POA: Diagnosis not present

## 2021-10-09 DIAGNOSIS — Z299 Encounter for prophylactic measures, unspecified: Secondary | ICD-10-CM | POA: Diagnosis not present

## 2021-10-09 DIAGNOSIS — W57XXXA Bitten or stung by nonvenomous insect and other nonvenomous arthropods, initial encounter: Secondary | ICD-10-CM | POA: Diagnosis not present

## 2021-10-09 DIAGNOSIS — I5032 Chronic diastolic (congestive) heart failure: Secondary | ICD-10-CM | POA: Diagnosis not present

## 2021-10-25 ENCOUNTER — Telehealth: Payer: Self-pay | Admitting: Cardiology

## 2021-10-25 DIAGNOSIS — E559 Vitamin D deficiency, unspecified: Secondary | ICD-10-CM | POA: Diagnosis not present

## 2021-10-25 DIAGNOSIS — F17210 Nicotine dependence, cigarettes, uncomplicated: Secondary | ICD-10-CM | POA: Diagnosis not present

## 2021-10-25 DIAGNOSIS — Z7189 Other specified counseling: Secondary | ICD-10-CM | POA: Diagnosis not present

## 2021-10-25 DIAGNOSIS — L259 Unspecified contact dermatitis, unspecified cause: Secondary | ICD-10-CM | POA: Diagnosis not present

## 2021-10-25 DIAGNOSIS — I10 Essential (primary) hypertension: Secondary | ICD-10-CM | POA: Diagnosis not present

## 2021-10-25 DIAGNOSIS — Z0000 Encounter for general adult medical examination without abnormal findings: Secondary | ICD-10-CM | POA: Diagnosis not present

## 2021-10-25 DIAGNOSIS — I5032 Chronic diastolic (congestive) heart failure: Secondary | ICD-10-CM | POA: Diagnosis not present

## 2021-10-25 DIAGNOSIS — Z299 Encounter for prophylactic measures, unspecified: Secondary | ICD-10-CM | POA: Diagnosis not present

## 2021-10-25 DIAGNOSIS — J449 Chronic obstructive pulmonary disease, unspecified: Secondary | ICD-10-CM | POA: Diagnosis not present

## 2021-10-25 NOTE — Telephone Encounter (Signed)
Pt called [facility] [address] after seeing provider Anda Kraft of  PCP Dr. Valda Favia office, 10/25/2021. ( Morning appointment. ) Pt stated Ms. White recommended follow up with her Cardiologist, Dr. Mayford Knife.    Pt also stated that she has been struggling with fluid build up on her ankles and feet for the past month.  She has applied an unknown cream, and was given some "fluid pills" by the PCP office. ( I was unable to obtain / confirm from Pt what was prescribed, what the dose was, but Pt did state that she takes one a day.  )  Pt call / connection was bad and I lost her call X3.  My last attempt at obtaining information, I was able to tell the patient to take the fluid pills as ordered, and was able to schedule an appointment with Dr. Mayford Knife on 10/30/21 at 220 pm.  Pt understood instructions, no f/u req.

## 2021-10-25 NOTE — Telephone Encounter (Signed)
Pt c/o swelling: STAT is pt has developed SOB within 24 hours  How much weight have you gained and in what time span?  No  If swelling, where is the swelling located?  Feet   Are you currently taking a fluid pill?  No   Are you currently SOB?  No   Do you have a log of your daily weights (if so, list)?  No   Have you gained 3 pounds in a day or 5 pounds in a week?  No   Have you traveled recently?  No

## 2021-10-25 NOTE — Telephone Encounter (Signed)
Patient returned call stating someone had called her and she lost the signal on her phone.

## 2021-10-30 ENCOUNTER — Telehealth: Payer: Self-pay | Admitting: *Deleted

## 2021-10-30 ENCOUNTER — Ambulatory Visit (INDEPENDENT_AMBULATORY_CARE_PROVIDER_SITE_OTHER): Payer: Medicare Other | Admitting: Cardiology

## 2021-10-30 ENCOUNTER — Encounter: Payer: Self-pay | Admitting: Cardiology

## 2021-10-30 VITALS — BP 108/80 | HR 63 | Ht 65.0 in | Wt 262.0 lb

## 2021-10-30 DIAGNOSIS — R0609 Other forms of dyspnea: Secondary | ICD-10-CM

## 2021-10-30 DIAGNOSIS — I272 Pulmonary hypertension, unspecified: Secondary | ICD-10-CM | POA: Diagnosis not present

## 2021-10-30 DIAGNOSIS — G4733 Obstructive sleep apnea (adult) (pediatric): Secondary | ICD-10-CM | POA: Diagnosis not present

## 2021-10-30 DIAGNOSIS — I10 Essential (primary) hypertension: Secondary | ICD-10-CM | POA: Diagnosis not present

## 2021-10-30 DIAGNOSIS — Z9989 Dependence on other enabling machines and devices: Secondary | ICD-10-CM

## 2021-10-30 DIAGNOSIS — R072 Precordial pain: Secondary | ICD-10-CM | POA: Diagnosis not present

## 2021-10-30 MED ORDER — METOPROLOL TARTRATE 50 MG PO TABS: 50.0000 mg | ORAL_TABLET | Freq: Once | ORAL | 0 refills | Status: DC

## 2021-10-30 NOTE — Telephone Encounter (Signed)
Per Dr Mayford Knife,  I will order her a new CPAP, ResMed CPAP with heated humidity, set at 10cm H2O

## 2021-10-30 NOTE — Telephone Encounter (Signed)
-----   Message from Theresia Majors, RN sent at 10/30/2021  2:51 PM EDT ----- Per Dr. Mayford Knife: Please order the patient a new CPAP machine.  Thanks!

## 2021-10-30 NOTE — Telephone Encounter (Signed)
Order placed to [facility] [employer] via community message 

## 2021-10-30 NOTE — Progress Notes (Signed)
Date:  10/30/2021   ID:  Kylie [name], DOB 03-Mar-1961, MRN [id number] The patient was identified using 2 identifiers.  PCP:  Kirstie Peri, MD   [facility] Providers Cardiologist:  Armanda Magic, MD     Evaluation Performed:  Follow-Up Visit  Chief Complaint:  SOB  History of Present Illness:    Kylie [name] is a 61 y.o. female with a hx of Chronic diastolic CHF, pulmonary HTN, obesity lupus, OSA on PAP who has been having problems with DOE and LE edema over the past few months.  It did not respond to diuretics and she underwent 2D echo showing normal LVF with normal BNP.  It was felt that her SOB and LE edema were related to obesity and OSA.  Heart cath showed normal right heart pressures.  Her diuretics were stopped.  Lexiscan myoview showed no ischemia.    She is here today for followup and is doing well.  She has been having chest discomfort that she describes as a pressure with no radiation.  It is nonexertional and gets better with ambulation.  There is no radiation of the discomfort and lasts about 10-15 min and then resolves.  She has had chronic SOB which are very stable and unchanged.. She denies any PND, orthopnea, dizziness, palpitations or syncope. She has been having problems with LE edema.  Her PCP changed her Lasix to Torsemide which has helped. She is compliant with her meds and is tolerating meds with no SE.    She is doing well with her CPAP device and thinks that she has gotten used to it.  Shd tolerates the mask and feels the pressure is adequate.  Since going on CPAP she feels rested in the am and has no significant daytime sleepiness.  She denies any significant mouth or nasal dryness or nasal congestion.  She does not think that he snores.    Past Medical History:  Diagnosis Date   Fibromyalgia    Hearing loss    Hypertension    Obesity (BMI 30-39.9) 04/14/2014   OSA (obstructive sleep apnea) 02/07/2015   Severe with AHI 36/hr now on CPAP at 10cm H2O    Pulmonary HTN (HCC) 04/14/2014   Resolved by 2D echo 2020   Sinus tachycardia 04/14/2014   SLE (systemic lupus erythematosus) (HCC)    Tobacco use    Past Surgical History:  Procedure Laterality Date   CESAREAN SECTION     CHOLECYSTECTOMY     COLONOSCOPY N/A 07/09/2016   Procedure: COLONOSCOPY;  Surgeon: West Bali, MD;  [facility];  Service: Endoscopy;  Laterality: N/A;  9:00am   RIGHT HEART CATH N/A 07/26/2018   Procedure: RIGHT HEART CATH;  Surgeon: Dolores Patty, MD;  Location: [facility] INVASIVE CV LAB;  Service: Cardiovascular;  Laterality: N/A;   RIGHT HEART CATHETERIZATION N/A 05/31/2014   Procedure: RIGHT HEART CATH;  Surgeon: Laurey Morale, MD;  Location: [facility] CATH LAB;  Service: Cardiovascular;  Laterality: N/A;     Current Meds  Medication Sig   acetaminophen (TYLENOL) 500 MG tablet Take 500 mg by mouth as needed for moderate pain.    Ascorbic Acid (VITAMIN C PO) Take 1 tablet by mouth daily.   Calcium Carbonate-Vitamin D (CALCIUM PLUS VITAMIN D PO) Take 1 tablet by mouth daily. Takes with Milk Chocolate   chlorzoxazone (PARAFON) 500 MG tablet Take 500 mg by mouth 4 (four) times daily as needed for muscle spasms.   Cholecalciferol (VITAMIN D3 PO)  Take 1 tablet by mouth daily.   HYDROcodone-acetaminophen (NORCO/VICODIN) 5-325 MG tablet Take 1-2 tablets by mouth as needed.    hydroxychloroquine (PLAQUENIL) 200 MG tablet Take 400 mg by mouth daily.   lisinopril (ZESTRIL) 30 MG tablet Take 1 tablet (30 mg total) by mouth daily.   metoprolol succinate (TOPROL-XL) 25 MG 24 hr tablet Take 0.5 tablets (12.5 mg total) by mouth daily.   mupirocin ointment (BACTROBAN) 2 % Apply topically 2 (two) times daily.   Omega-3 Fatty Acids (FISH OIL PO) Take 1 tablet by mouth daily.   PARoxetine (PAXIL) 40 MG tablet Take 40 mg by mouth every evening.    torsemide (DEMADEX) 20 MG tablet Take 20 mg by mouth daily.   traZODone (DESYREL) 100 MG tablet Take 100 mg by mouth at bedtime.    VITAMIN E PO Take 1 tablet by mouth daily.     Allergies:   Ibuprofen, Amoxicillin, and Sulfa antibiotics   Social History   Tobacco Use   Smoking status: Every Day    Packs/day: 0.25    Years: 5.00    Total pack years: 1.25    Types: Cigarettes   Smokeless tobacco: Never  Vaping Use   Vaping Use: Never used  Substance Use Topics   Alcohol use: No   Drug use: No     Family Hx: The patient's family history includes Coronary artery disease (age of onset: 28) in her father; Lupus in her mother.  ROS:   Please see the history of present illness.     All other systems reviewed and are negative.   Prior CV studies:   The following studies were reviewed today:  PAP compliance download  Labs/Other Tests and Data Reviewed:    EKG:  EKG performed today showed NSR with no ST changes  Recent Labs: 04/24/2021: BUN 9; Creatinine, Ser 0.72; Potassium 4.2; Sodium 141   Recent Lipid Panel Lab Results  Component Value Date/Time   CHOL 133 06/14/2019 11:07 AM   TRIG 97 06/14/2019 11:07 AM   HDL 47 06/14/2019 11:07 AM   CHOLHDL 2.8 06/14/2019 11:07 AM   LDLCALC 68 06/14/2019 11:07 AM    Wt Readings from Last 3 Encounters:  10/30/21 262 lb (118.8 kg)  04/01/21 257 lb (116.6 kg)  02/23/20 266 lb 9.6 oz (120.9 kg)     Risk Assessment/Calculations:          Objective:    Vital Signs:  BP 108/80   Pulse 63   Ht 5\' 5"  (1.651 m)   Wt 262 lb (118.8 kg)   BMI 43.60 kg/m   GEN: Well nourished, well developed in no acute distress HEENT: Normal NECK: No JVD; No carotid bruits LYMPHATICS: No lymphadenopathy CARDIAC:RRR, no murmurs, rubs, gallops RESPIRATORY:  Clear to auscultation without rales, wheezing or rhonchi  ABDOMEN: Soft, non-tender, non-distended MUSCULOSKELETAL:  No edema; No deformity  SKIN: Warm and dry NEUROLOGIC:  Alert and oriented x 3 PSYCHIATRIC:  Normal affect   ASSESSMENT & PLAN:    1.  Chronic Shortness of breath  - 2D echo showed normal LV  function and diastolic function.   - RHC showed normal right heart pressures 2020 - It is felt that her shortness of breath is more related to underlying morbid obesity as well as obstructive sleep apnea and possibly GERD.  - Lexiscan Myoview showed no ischemia 2020   2.  Pulmonary hypertension  -this was noted in the past on echo but  right heart cath showed normal  right heart pressures. -She has not had an echo done in 3 years so I will repeat a 2D echocardiogram to reassess her pulmonary    3.  Hypertension  -BP is controlled on exam today -Continue prescription drug management Toprol-XL 12.5 mg daily and lisinopril 30 mg daily with as needed refills -I have personally reviewed and interpreted outside labs performed by patient's PCP which showed serum creatinine 0.81 and potassium 4.4 on 10/25/2021   4.  OSA - The patient is tolerating PAP therapy well without any problems.  The patient has been using and benefiting from PAP use and will continue to benefit from therapy.  -I will get a download from her DME -her device if old and I will order her a new CPAP  5.  Chest pain -this is atypical and nonexertional and described as a pressure>>likely related to GERD -I will get a coronary CTA to define coronary anatomy    Medication Adjustments/Labs and Tests Ordered: Current medicines are reviewed at length with the patient today.  Concerns regarding medicines are outlined above.   Tests Ordered: Orders Placed This Encounter  Procedures   EKG 12-Lead    Medication Changes: No orders of the defined types were placed in this encounter.   Follow Up:  In Person in 1 year(s)  Signed, Armanda Magic, MD  10/30/2021 2:29 PM    [facility]

## 2021-10-30 NOTE — Addendum Note (Signed)
Addended by: Theresia Majors on: 10/30/2021 02:44 PM   Modules accepted: Orders

## 2021-10-30 NOTE — Patient Instructions (Addendum)
Medication Instructions:  Your physician recommends that you continue on your current medications as directed. Please refer to the Current Medication list given to you today.  *If you need a refill on your cardiac medications before your next appointment, please call your pharmacy*  Lab Work: TODAY: BMET If you have labs (blood work) drawn today and your tests are completely normal, you will receive your results only by: MyChart Message (if you have MyChart) OR A paper copy in the mail If you have any lab test that is abnormal or we need to change your treatment, we will call you to review the results.  Testing/Procedures: Your physician has requested that you have an echocardiogram. Echocardiography is a painless test that uses sound waves to create images of your heart. It provides your doctor with information about the size and shape of your heart and how well your heart's chambers and valves are working. This procedure takes approximately one hour. There are no restrictions for this procedure.  Your physician has requested that you have coronary CTA. Cardiac computed tomography (CT) is a painless test that uses an x-ray machine to take clear, detailed pictures of your heart.   Follow-Up: At [facility], you and your health needs are our priority.  As part of our continuing mission to provide you with exceptional heart care, we have created designated Provider Care Teams.  These Care Teams include your primary Cardiologist (physician) and Advanced Practice Providers (APPs -  Physician Assistants and Nurse Practitioners) who all work together to provide you with the care you need, when you need it.  Follow up with Dr. Mayford Knife after receiving your new CPAP machine.      Your cardiac CT will be scheduled at:   [facility] [address] [place], [place] [address] [phone]  Please arrive at the [facility] and Children's Entrance (Entrance C2) of [facility] 30  minutes prior to test start time. You can use the FREE valet parking offered at entrance C (encouraged to control the heart rate for the test)  Proceed to the [facility] Radiology Department (first floor) to check-in and test prep.  All radiology patients and guests should use entrance C2 at [facility], accessed from [facility], even though the hospital's physical address listed is [address]    Please follow these instructions carefully (unless otherwise directed):   On the Night Before the Test: Be sure to Drink plenty of water. Do not consume any caffeinated/decaffeinated beverages or chocolate 12 hours prior to your test. Do not take any antihistamines 12 hours prior to your test.  On the Day of the Test: Drink plenty of water until 1 hour prior to the test. Do not eat any food 4 hours prior to the test. You may take your regular medications prior to the test.  Take metoprolol (Lopressor) two hours prior to test. HOLD Torsemide morning of the test. FEMALES- please wear underwire-free bra if available, avoid dresses & tight clothing      After the Test: Drink plenty of water. After receiving IV contrast, you may experience a mild flushed feeling. This is normal. On occasion, you may experience a mild rash up to 24 hours after the test. This is not dangerous. If this occurs, you can take Benadryl 25 mg and increase your fluid intake. If you experience trouble breathing, this can be serious. If it is severe call 911 IMMEDIATELY. If it is mild, please call our office.  If you take any of these medications: Glipizide/Metformin, Avandament, Glucavance, please do not take 48 hours after completing test unless otherwise instructed.  We will call to schedule your test 2-4 weeks out understanding that some insurance companies will need an authorization prior to the service being performed.   For non-scheduling related questions, please contact the cardiac  imaging nurse navigator should you have any questions/concerns: Rockwell Alexandria, Cardiac Imaging Nurse Navigator Larey Brick, Cardiac Imaging Nurse Navigator [facility] Direct Office Dial: [phone]   For scheduling needs, including cancellations and rescheduling, please call [place], [phone].

## 2021-10-31 LAB — BASIC METABOLIC PANEL
BUN/Creatinine Ratio: 10 — ABNORMAL LOW (ref 12–28)
BUN: 10 mg/dL (ref 8–27)
CO2: 25 mmol/L (ref 20–29)
Calcium: 9.5 mg/dL (ref 8.7–10.3)
Chloride: 93 mmol/L — ABNORMAL LOW (ref 96–106)
Creatinine, Ser: 1 mg/dL (ref 0.57–1.00)
Glucose: 84 mg/dL (ref 70–99)
Potassium: 4.2 mmol/L (ref 3.5–5.2)
Sodium: 137 mmol/L (ref 134–144)
eGFR: 64 mL/min/{1.73_m2} (ref >59)

## 2021-11-13 ENCOUNTER — Encounter: Payer: Self-pay | Admitting: Cardiology

## 2021-11-13 ENCOUNTER — Ambulatory Visit (HOSPITAL_COMMUNITY): Payer: Medicare Other | Attending: Cardiology

## 2021-11-13 NOTE — Progress Notes (Unsigned)
Patient ID: Kylie [name], female   DOB: Dec 07, 1960, 61 y.o.   MRN: [id number]  Verified appointment "no show" status with Shanda Bumps at 9:45am.

## 2021-11-19 ENCOUNTER — Telehealth (HOSPITAL_COMMUNITY): Payer: Self-pay | Admitting: Emergency Medicine

## 2021-11-19 DIAGNOSIS — G4733 Obstructive sleep apnea (adult) (pediatric): Secondary | ICD-10-CM | POA: Diagnosis not present

## 2021-11-19 NOTE — Telephone Encounter (Signed)
Reaching out to patient to offer assistance regarding upcoming cardiac imaging study; pt verbalizes understanding of appt date/time, parking situation and where to check in, pre-test NPO status and medications ordered, and verified current allergies; name and call back number provided for further questions should they arise Rockwell Alexandria RN Navigator Cardiac Imaging [facility] [phone] office [phone] cell  Arrival 830 w/c entrance 50mg  metoprolol tartrate Denies iv issues Holding other BP meds

## 2021-11-20 ENCOUNTER — Telehealth: Payer: Self-pay | Admitting: Cardiology

## 2021-11-20 ENCOUNTER — Ambulatory Visit (HOSPITAL_BASED_OUTPATIENT_CLINIC_OR_DEPARTMENT_OTHER)
Admission: RE | Admit: 2021-11-20 | Discharge: 2021-11-20 | Disposition: A | Discharge status: Home / Self Care | Payer: Medicare Other | Source: Ambulatory Visit | Attending: Cardiology | Admitting: Cardiology

## 2021-11-20 ENCOUNTER — Telehealth: Payer: Self-pay

## 2021-11-20 ENCOUNTER — Encounter: Payer: Self-pay | Admitting: Cardiology

## 2021-11-20 ENCOUNTER — Ambulatory Visit (HOSPITAL_COMMUNITY)
Admission: RE | Admit: 2021-11-20 | Discharge: 2021-11-20 | Disposition: A | Discharge status: Home / Self Care | Payer: Medicare Other | Source: Ambulatory Visit | Attending: Cardiology | Admitting: Cardiology

## 2021-11-20 DIAGNOSIS — I10 Essential (primary) hypertension: Secondary | ICD-10-CM

## 2021-11-20 DIAGNOSIS — Z9989 Dependence on other enabling machines and devices: Secondary | ICD-10-CM | POA: Diagnosis not present

## 2021-11-20 DIAGNOSIS — G4733 Obstructive sleep apnea (adult) (pediatric): Secondary | ICD-10-CM | POA: Insufficient documentation

## 2021-11-20 DIAGNOSIS — R0609 Other forms of dyspnea: Secondary | ICD-10-CM

## 2021-11-20 DIAGNOSIS — I272 Pulmonary hypertension, unspecified: Secondary | ICD-10-CM

## 2021-11-20 DIAGNOSIS — R072 Precordial pain: Secondary | ICD-10-CM | POA: Diagnosis not present

## 2021-11-20 DIAGNOSIS — I2510 Atherosclerotic heart disease of native coronary artery without angina pectoris: Secondary | ICD-10-CM | POA: Insufficient documentation

## 2021-11-20 LAB — ECHOCARDIOGRAM COMPLETE
Area-P 1/2: 3.5 cm2
S' Lateral: 2.3 cm

## 2021-11-20 MED ORDER — ASPIRIN 81 MG PO TBEC: 81.0000 mg | DELAYED_RELEASE_TABLET | Freq: Every day | ORAL | 3 refills | Status: AC

## 2021-11-20 MED ORDER — NITROGLYCERIN 0.4 MG SL SUBL
SUBLINGUAL_TABLET | SUBLINGUAL | Status: AC
  Administered 2021-11-20: 0.8 mg via SUBLINGUAL
  Filled 2021-11-20: qty 2

## 2021-11-20 MED ORDER — NITROGLYCERIN 0.4 MG SL SUBL: 0.8000 mg | SUBLINGUAL_TABLET | Freq: Once | SUBLINGUAL | Status: AC

## 2021-11-20 MED ORDER — IOHEXOL 350 MG/ML SOLN
100.0000 mL | Freq: Once | INTRAVENOUS | Status: AC | PRN
  Administered 2021-11-20: 100 mL via INTRAVENOUS

## 2021-11-20 NOTE — Telephone Encounter (Signed)
The patient has been notified of the result and verbalized understanding.  All questions (if any) were answered. Theresia Majors, RN 11/20/2021 2:22 PM  Patient will start on ASA 81 daily. Lab orders have been sent to patient's PCP per request.

## 2021-11-20 NOTE — Telephone Encounter (Signed)
-----   Message from Quintella Reichert, MD sent at 11/20/2021 12:19 PM EDT ----- Coronary CTA  showed coronary Ca score of 4.  There is <25% RPDA, 25-49% mid to dLAD.  Small PFO.  Start ASA 81mg  daily.  Have patient come in for FLP and ALT.

## 2021-11-20 NOTE — Telephone Encounter (Signed)
11/20/21 LVM to schedule sleep compliance appt - LCN 

## 2021-11-21 ENCOUNTER — Encounter: Payer: Self-pay | Admitting: Cardiology

## 2021-11-21 DIAGNOSIS — I77810 Thoracic aortic ectasia: Secondary | ICD-10-CM | POA: Insufficient documentation

## 2021-12-19 DIAGNOSIS — G4733 Obstructive sleep apnea (adult) (pediatric): Secondary | ICD-10-CM | POA: Diagnosis not present

## 2021-12-25 ENCOUNTER — Telehealth: Payer: Self-pay | Admitting: Cardiology

## 2021-12-25 NOTE — Telephone Encounter (Signed)
Left message for patient to call back  

## 2021-12-25 NOTE — Telephone Encounter (Signed)
Patient called about a COVID test.  She said she received from [place] stating she needs a COVID test.

## 2021-12-27 ENCOUNTER — Encounter: Payer: Self-pay | Admitting: Cardiology

## 2021-12-27 ENCOUNTER — Ambulatory Visit: Payer: Medicare Other | Attending: Cardiology | Admitting: Cardiology

## 2021-12-27 VITALS — Ht 65.0 in | Wt 262.0 lb

## 2021-12-27 DIAGNOSIS — I10 Essential (primary) hypertension: Secondary | ICD-10-CM | POA: Diagnosis not present

## 2021-12-27 DIAGNOSIS — G4733 Obstructive sleep apnea (adult) (pediatric): Secondary | ICD-10-CM | POA: Diagnosis not present

## 2021-12-27 NOTE — Patient Instructions (Signed)
Medication Instructions:  Your physician recommends that you continue on your current medications as directed. Please refer to the Current Medication list given to you today.  *If you need a refill on your cardiac medications before your next appointment, please call your pharmacy*  Follow-Up: At [facility], you and your health needs are our priority.  As part of our continuing mission to provide you with exceptional heart care, we have created designated Provider Care Teams.  These Care Teams include your primary Cardiologist (physician) and Advanced Practice Providers (APPs -  Physician Assistants and Nurse Practitioners) who all work together to provide you with the care you need, when you need it.  Your next appointment:   1 year(s)  The format for your next appointment:   In Person  Provider:   Traci Turner, MD     Important Information About Sugar       

## 2021-12-27 NOTE — Progress Notes (Signed)
SLEEP MEDICINE VIRTUAL VISIT via Video Note   Because of Tanis Angeline Trick co-morbid illnesses, she is at least at moderate risk for complications without adequate follow up.  This format is felt to be most appropriate for this patient at this time.  All issues noted in this document were discussed and addressed.  A limited physical exam was performed with this format.  Please refer to the patient's chart for her consent to telehealth for [facility].       Date:  12/27/2021   ID:  Kylie [name], DOB [birth year], MRN [id number] The patient was identified using 2 identifiers.  Patient Location: Home Provider Location: Home Office   PCP:  Monico Blitz, MD   [facility] Providers Cardiologist:  Fransico Him, MD     Evaluation Performed:  Follow-Up Visit  Chief Complaint:  OSA  History of Present Illness:    Kylie [name] is a 61 y.o. female with  a hx of Chronic diastolic CHF, pulmonary HTN, obesity lupus, OSA on PAP who has been having problems with DOE and LE edema over the past few months.  It did not respond to diuretics and she underwent 2D echo showing normal LVF with normal BNP.  It was felt that her SOB and LE edema were related to obesity and OSA.  Heart cath showed normal right heart pressures.  Her diuretics were stopped.  Lexiscan myoview showed no ischemia.  When I last saw her she complained of chest pain and a coronary CTA was done 11/2021 showing a coronary Ca score of 4.  There is <25% RPDA, 25-49% mid to dLAD.  2D echo was also normal. She also had an old CPAP device so we ordered her a new CPAP and she is now here for followup per insurance to document compliance with the new device.   She is doing well with her PAP device and thinks that she has gotten used to it.  She tolerates the mask and feels the pressure is adequate.  Since going on PAP she feels rested in the am and has no significant daytime sleepiness.  She denies any significant  mouth or nasal dryness or nasal congestion.  She does not think that she snores.    Past Medical History:  Diagnosis Date   Ascending aorta dilatation ([place])    57mm by echo 9/23   CAD (coronary artery disease), native coronary artery    Coronary CTA 11/2021  showed coronary Ca score of 4.  There is <25% RPDA, 25-49% mid to dLAD   Fibromyalgia    Hearing loss    Hypertension    Obesity (BMI 30-39.9) 04/14/2014   OSA (obstructive sleep apnea) 02/07/2015   Severe with AHI 36/hr now on CPAP at 10cm H2O   Pulmonary HTN ([place]) 04/14/2014   Resolved by 2D echo 2020   Sinus tachycardia 04/14/2014   SLE (systemic lupus erythematosus) ([place])    Tobacco use    Past Surgical History:  Procedure Laterality Date   CESAREAN SECTION     CHOLECYSTECTOMY     COLONOSCOPY N/A 07/09/2016   Procedure: COLONOSCOPY;  Surgeon: Danie Binder, MD;  [facility];  Service: Endoscopy;  Laterality: N/A;  9:00am   RIGHT HEART CATH N/A 07/26/2018   Procedure: RIGHT HEART CATH;  Surgeon: Jolaine Artist, MD;  Location: [place] CV LAB;  Service: Cardiovascular;  Laterality: N/A;   RIGHT HEART CATHETERIZATION N/A 05/31/2014   Procedure:  RIGHT HEART CATH;  Surgeon: Laurey Morale, MD;  Location: [facility] CATH LAB;  Service: Cardiovascular;  Laterality: N/A;     Current Meds  Medication Sig   acetaminophen (TYLENOL) 500 MG tablet Take 500 mg by mouth as needed for moderate pain.    Ascorbic Acid (VITAMIN C PO) Take 1 tablet by mouth daily.   aspirin EC 81 MG tablet Take 1 tablet (81 mg total) by mouth daily. Swallow whole.   Calcium Carbonate-Vitamin D (CALCIUM PLUS VITAMIN D PO) Take 1 tablet by mouth daily. Takes with Milk Chocolate   chlorzoxazone (PARAFON) 500 MG tablet Take 500 mg by mouth 4 (four) times daily as needed for muscle spasms.   Cholecalciferol (VITAMIN D3 PO) Take 1 tablet by mouth daily.   HYDROcodone-acetaminophen (NORCO/VICODIN) 5-325 MG tablet Take 1-2 tablets by mouth as needed.     hydroxychloroquine (PLAQUENIL) 200 MG tablet Take 400 mg by mouth daily.   lisinopril (ZESTRIL) 30 MG tablet Take 1 tablet (30 mg total) by mouth daily.   metoprolol succinate (TOPROL-XL) 25 MG 24 hr tablet Take 0.5 tablets (12.5 mg total) by mouth daily.   metoprolol tartrate (LOPRESSOR) 50 MG tablet Take 1 tablet (50 mg total) by mouth once for 1 dose. Take 1 tablet two hours prior to CT scan.   mupirocin ointment (BACTROBAN) 2 % Apply topically 2 (two) times daily.   Omega-3 Fatty Acids (FISH OIL PO) Take 1 tablet by mouth daily.   PARoxetine (PAXIL) 40 MG tablet Take 40 mg by mouth every evening.    torsemide (DEMADEX) 20 MG tablet Take 20 mg by mouth daily.   traZODone (DESYREL) 100 MG tablet Take 100 mg by mouth at bedtime.   VITAMIN E PO Take 1 tablet by mouth daily.     Allergies:   Ibuprofen, Amoxicillin, and Sulfa antibiotics   Social History   Tobacco Use   Smoking status: Every Day    Packs/day: 0.25    Years: 5.00    Total pack years: 1.25    Types: Cigarettes   Smokeless tobacco: Never  Vaping Use   Vaping Use: Never used  Substance Use Topics   Alcohol use: No   Drug use: No     Family Hx: The patient's family history includes Coronary artery disease (age of onset: 62) in her father; Lupus in her mother.  ROS:   Please see the history of present illness.    She All other systems reviewed and are negative.   Prior Sleep studies:   The following studies were reviewed today:  PAP compliance download  Labs/Other Tests and Data Reviewed:     Recent Labs: 10/30/2021: BUN 10; Creatinine, Ser 1.00; Potassium 4.2; Sodium 137   Wt Readings from Last 3 Encounters:  12/27/21 262 lb (118.8 kg)  10/30/21 262 lb (118.8 kg)  04/01/21 257 lb (116.6 kg)     Risk Assessment/Calculations:      Objective:    Vital Signs:  Ht 5\' 5"  (1.651 m)   Wt 262 lb (118.8 kg)   BMI 43.60 kg/m    VITAL SIGNS:  reviewed GEN:  no acute distress EYES:  sclerae  anicteric, EOMI - Extraocular Movements Intact RESPIRATORY:  normal respiratory effort, symmetric expansion CARDIOVASCULAR:  no peripheral edema SKIN:  no rash, lesions or ulcers. MUSCULOSKELETAL:  no obvious deformities. NEURO:  alert and oriented x 3, no obvious focal deficit PSYCH:  normal affect  ASSESSMENT & PLAN:    OSA - The patient is  tolerating PAP therapy well without any problems. The PAP download performed by his DME was personally reviewed and interpreted by me today and showed an AHI of 1/hr on 10 cm H2O with 90% compliance in using more than 4 hours nightly.  The patient has been using and benefiting from PAP use and will continue to benefit from therapy.   HTN -BP controlled at home -continue prescription drug management with Lisinopril 30mg  daily, Toprol XL 12.5mg  BID with PRN refills    Total time of encounter: 15 minutes total time of encounter, including 10 minutes spent in face-to-face patient care on the date of this encounter. This time includes coordination of care and counseling regarding above mentioned problem list. Remainder of non-face-to-face time involved reviewing chart documents/testing relevant to the patient encounter and documentation in the medical record. I have independently reviewed documentation from referring provider.    Medication Adjustments/Labs and Tests Ordered: Current medicines are reviewed at length with the patient today.  Concerns regarding medicines are outlined above.   Tests Ordered: No orders of the defined types were placed in this encounter.   Medication Changes: No orders of the defined types were placed in this encounter.   Follow Up:  In Person in 1 year(s)  Signed, , MD  12/27/2021 9:42 AM    [facility]

## 2022-01-19 DIAGNOSIS — G4733 Obstructive sleep apnea (adult) (pediatric): Secondary | ICD-10-CM | POA: Diagnosis not present

## 2022-01-28 DIAGNOSIS — D84821 Immunodeficiency due to drugs: Secondary | ICD-10-CM | POA: Diagnosis not present

## 2022-01-28 DIAGNOSIS — M329 Systemic lupus erythematosus, unspecified: Secondary | ICD-10-CM | POA: Diagnosis not present

## 2022-01-28 DIAGNOSIS — I10 Essential (primary) hypertension: Secondary | ICD-10-CM | POA: Diagnosis not present

## 2022-01-28 DIAGNOSIS — Z299 Encounter for prophylactic measures, unspecified: Secondary | ICD-10-CM | POA: Diagnosis not present

## 2022-01-28 DIAGNOSIS — K219 Gastro-esophageal reflux disease without esophagitis: Secondary | ICD-10-CM | POA: Diagnosis not present

## 2022-02-11 DIAGNOSIS — Z1231 Encounter for screening mammogram for malignant neoplasm of breast: Secondary | ICD-10-CM | POA: Diagnosis not present

## 2022-02-17 DIAGNOSIS — G4733 Obstructive sleep apnea (adult) (pediatric): Secondary | ICD-10-CM | POA: Diagnosis not present

## 2022-02-18 DIAGNOSIS — G4733 Obstructive sleep apnea (adult) (pediatric): Secondary | ICD-10-CM | POA: Diagnosis not present

## 2022-02-26 DIAGNOSIS — R21 Rash and other nonspecific skin eruption: Secondary | ICD-10-CM | POA: Diagnosis not present

## 2022-02-26 DIAGNOSIS — M797 Fibromyalgia: Secondary | ICD-10-CM | POA: Diagnosis not present

## 2022-02-26 DIAGNOSIS — Z7952 Long term (current) use of systemic steroids: Secondary | ICD-10-CM | POA: Diagnosis not present

## 2022-02-26 DIAGNOSIS — M329 Systemic lupus erythematosus, unspecified: Secondary | ICD-10-CM | POA: Diagnosis not present

## 2022-02-26 DIAGNOSIS — M25511 Pain in right shoulder: Secondary | ICD-10-CM | POA: Diagnosis not present

## 2022-03-04 ENCOUNTER — Other Ambulatory Visit: Payer: Self-pay | Admitting: Cardiology

## 2022-03-04 DIAGNOSIS — I272 Pulmonary hypertension, unspecified: Secondary | ICD-10-CM

## 2022-03-04 DIAGNOSIS — I10 Essential (primary) hypertension: Secondary | ICD-10-CM

## 2022-03-21 DIAGNOSIS — G4733 Obstructive sleep apnea (adult) (pediatric): Secondary | ICD-10-CM | POA: Diagnosis not present

## 2022-04-14 DIAGNOSIS — U071 COVID-19: Secondary | ICD-10-CM | POA: Diagnosis not present

## 2022-04-14 DIAGNOSIS — I5032 Chronic diastolic (congestive) heart failure: Secondary | ICD-10-CM | POA: Diagnosis not present

## 2022-04-14 DIAGNOSIS — J441 Chronic obstructive pulmonary disease with (acute) exacerbation: Secondary | ICD-10-CM | POA: Diagnosis not present

## 2022-04-21 DIAGNOSIS — G4733 Obstructive sleep apnea (adult) (pediatric): Secondary | ICD-10-CM | POA: Diagnosis not present

## 2022-04-30 DIAGNOSIS — M329 Systemic lupus erythematosus, unspecified: Secondary | ICD-10-CM | POA: Diagnosis not present

## 2022-04-30 DIAGNOSIS — I5032 Chronic diastolic (congestive) heart failure: Secondary | ICD-10-CM | POA: Diagnosis not present

## 2022-04-30 DIAGNOSIS — Z299 Encounter for prophylactic measures, unspecified: Secondary | ICD-10-CM | POA: Diagnosis not present

## 2022-04-30 DIAGNOSIS — I10 Essential (primary) hypertension: Secondary | ICD-10-CM | POA: Diagnosis not present

## 2022-05-19 DIAGNOSIS — G4733 Obstructive sleep apnea (adult) (pediatric): Secondary | ICD-10-CM | POA: Diagnosis not present

## 2022-05-20 DIAGNOSIS — G4733 Obstructive sleep apnea (adult) (pediatric): Secondary | ICD-10-CM | POA: Diagnosis not present

## 2022-05-26 ENCOUNTER — Other Ambulatory Visit: Payer: Self-pay | Admitting: Cardiology

## 2022-05-26 DIAGNOSIS — I272 Pulmonary hypertension, unspecified: Secondary | ICD-10-CM

## 2022-05-26 DIAGNOSIS — I10 Essential (primary) hypertension: Secondary | ICD-10-CM

## 2022-06-20 DIAGNOSIS — G4733 Obstructive sleep apnea (adult) (pediatric): Secondary | ICD-10-CM | POA: Diagnosis not present

## 2022-07-20 DIAGNOSIS — G4733 Obstructive sleep apnea (adult) (pediatric): Secondary | ICD-10-CM | POA: Diagnosis not present

## 2022-07-29 DIAGNOSIS — D84821 Immunodeficiency due to drugs: Secondary | ICD-10-CM | POA: Diagnosis not present

## 2022-07-29 DIAGNOSIS — Z299 Encounter for prophylactic measures, unspecified: Secondary | ICD-10-CM | POA: Diagnosis not present

## 2022-07-29 DIAGNOSIS — I5032 Chronic diastolic (congestive) heart failure: Secondary | ICD-10-CM | POA: Diagnosis not present

## 2022-07-29 DIAGNOSIS — I10 Essential (primary) hypertension: Secondary | ICD-10-CM | POA: Diagnosis not present

## 2022-07-29 DIAGNOSIS — I272 Pulmonary hypertension, unspecified: Secondary | ICD-10-CM | POA: Diagnosis not present

## 2022-08-18 DIAGNOSIS — G4733 Obstructive sleep apnea (adult) (pediatric): Secondary | ICD-10-CM | POA: Diagnosis not present

## 2022-08-20 DIAGNOSIS — G4733 Obstructive sleep apnea (adult) (pediatric): Secondary | ICD-10-CM | POA: Diagnosis not present

## 2022-08-21 ENCOUNTER — Encounter: Payer: Self-pay | Admitting: Cardiology

## 2022-08-21 ENCOUNTER — Ambulatory Visit: Payer: 59 | Attending: Cardiology | Admitting: Cardiology

## 2022-08-21 ENCOUNTER — Ambulatory Visit (INDEPENDENT_AMBULATORY_CARE_PROVIDER_SITE_OTHER): Payer: 59

## 2022-08-21 VITALS — BP 112/80 | HR 70 | Ht 65.0 in | Wt 278.6 lb

## 2022-08-21 DIAGNOSIS — Z79899 Other long term (current) drug therapy: Secondary | ICD-10-CM

## 2022-08-21 DIAGNOSIS — I10 Essential (primary) hypertension: Secondary | ICD-10-CM

## 2022-08-21 DIAGNOSIS — R0609 Other forms of dyspnea: Secondary | ICD-10-CM | POA: Diagnosis not present

## 2022-08-21 DIAGNOSIS — I2510 Atherosclerotic heart disease of native coronary artery without angina pectoris: Secondary | ICD-10-CM

## 2022-08-21 DIAGNOSIS — R002 Palpitations: Secondary | ICD-10-CM

## 2022-08-21 DIAGNOSIS — I272 Pulmonary hypertension, unspecified: Secondary | ICD-10-CM

## 2022-08-21 DIAGNOSIS — G4733 Obstructive sleep apnea (adult) (pediatric): Secondary | ICD-10-CM

## 2022-08-21 MED ORDER — TORSEMIDE 20 MG PO TABS: 20.0000 mg | ORAL_TABLET | Freq: Two times a day (BID) | ORAL | 3 refills | Status: DC

## 2022-08-21 NOTE — Addendum Note (Signed)
Addended by: Luellen Pucker on: 08/21/2022 10:03 AM   Modules accepted: Orders

## 2022-08-21 NOTE — Progress Notes (Addendum)
Date:  08/21/2022   ID:  Kmora [name], DOB June 12, 1960, MRN [id number] The patient was identified using 2 identifiers.  PCP:  Kirstie Peri, MD   [facility] Providers Cardiologist:  Armanda Magic, MD     Evaluation Performed:  Follow-Up Visit  Chief Complaint:  OSA, CAD, HTN, PHTN, SOB  History of Present Illness:    Kylie [name] is a 62 y.o. female with  a hx of Chronic diastolic CHF, pulmonary HTN, obesity lupus, OSA on PAP who has been having problems with DOE and LE edema over the past few months.  It did not respond to diuretics and she underwent 2D echo showing normal LVF with normal BNP.  It was felt that her SOB and LE edema were related to obesity and OSA.  Heart cath showed normal right heart pressures.  Her diuretics were stopped.  Lexiscan myoview showed no ischemia.  Later due to CP a coronary CTA was done 11/2021 showing a coronary Ca score of 4.  There is <25% RPDA, 25-49% mid to dLAD.  2D echo was also normal.   She is here today for followup and is doing well.  She denies any chest pain or pressure, SOB, DOE, PND, orthopnea, dizziness or syncope. She has been having palpitations. She recently has been gaining weight.  She says that her weight fluctuates between 270 and 277lbs.  She was placed on Prednisone 3 months ago and was actually taking a higher dose that she was supposed to. She has chronic LE edema that she think is stable. She is compliant with her meds and is tolerating meds with no SE.    She is doing well with her PAP device and thinks that she has gotten used to it.  She tolerates the mask and feels the pressure is adequate.  Since going on PAP she feels rested in the am and has no significant daytime sleepiness.  She denies any significant mouth or nasal dryness or nasal congestion.  She does not think that he snores.    Past Medical History:  Diagnosis Date   Ascending aorta dilatation (HCC)    39mm by echo 9/23   CAD (coronary artery  disease), native coronary artery    Coronary CTA 11/2021  showed coronary Ca score of 4.  There is <25% RPDA, 25-49% mid to dLAD   Fibromyalgia    Hearing loss    Hypertension    Obesity (BMI 30-39.9) 04/14/2014   OSA (obstructive sleep apnea) 02/07/2015   Severe with AHI 36/hr now on CPAP at 10cm H2O   Pulmonary HTN (HCC) 04/14/2014   Resolved by 2D echo 2020   Sinus tachycardia 04/14/2014   SLE (systemic lupus erythematosus) (HCC)    Tobacco use    Past Surgical History:  Procedure Laterality Date   CESAREAN SECTION     CHOLECYSTECTOMY     COLONOSCOPY N/A 07/09/2016   Procedure: COLONOSCOPY;  Surgeon: West Bali, MD;  Location: [facility];  Service: Endoscopy;  Laterality: N/A;  9:00am   RIGHT HEART CATH N/A 07/26/2018   Procedure: RIGHT HEART CATH;  Surgeon: Dolores Patty, MD;  Location: [facility] INVASIVE CV LAB;  Service: Cardiovascular;  Laterality: N/A;   RIGHT HEART CATHETERIZATION N/A 05/31/2014   Procedure: RIGHT HEART CATH;  Surgeon: Laurey Morale, MD;  Location: [facility] CATH LAB;  Service: Cardiovascular;  Laterality: N/A;     Current Meds  Medication Sig   acetaminophen (TYLENOL) 500 MG  tablet Take 500 mg by mouth as needed for moderate pain.    Ascorbic Acid (VITAMIN C PO) Take 1 tablet by mouth daily.   aspirin EC 81 MG tablet Take 1 tablet (81 mg total) by mouth daily. Swallow whole.   chlorzoxazone (PARAFON) 500 MG tablet Take 500 mg by mouth 4 (four) times daily as needed for muscle spasms.   Cholecalciferol (VITAMIN D3 PO) Take 1 tablet by mouth daily.   HYDROcodone-acetaminophen (NORCO/VICODIN) 5-325 MG tablet Take 1-2 tablets by mouth as needed.    hydroxychloroquine (PLAQUENIL) 200 MG tablet Take 400 mg by mouth daily.   lisinopril (ZESTRIL) 30 MG tablet TAKE ONE TABLET ONCE DAILY   metoprolol succinate (TOPROL-XL) 25 MG 24 hr tablet TAKE 1/2 TABLET DAILY   mupirocin ointment (BACTROBAN) 2 % Apply topically 2 (two) times daily.   Omega-3 Fatty Acids (FISH  OIL PO) Take 1 tablet by mouth daily.   omeprazole (PRILOSEC) 40 MG capsule Take 40 mg by mouth daily.   PARoxetine (PAXIL) 40 MG tablet Take 40 mg by mouth every evening.    predniSONE (DELTASONE) 5 MG tablet Take 5 mg by mouth daily.   torsemide (DEMADEX) 20 MG tablet Take 20 mg by mouth daily.   traMADol (ULTRAM) 50 MG tablet Take 50 mg by mouth every 6 (six) hours as needed.   traZODone (DESYREL) 100 MG tablet Take 100 mg by mouth at bedtime.   VITAMIN E PO Take 1 tablet by mouth daily.     Allergies:   Ibuprofen, Amoxicillin, and Sulfa antibiotics   Social History   Tobacco Use   Smoking status: Every Day    Packs/day: 0.25    Years: 5.00    Additional pack years: 0.00    Total pack years: 1.25    Types: Cigarettes   Smokeless tobacco: Never  Vaping Use   Vaping Use: Never used  Substance Use Topics   Alcohol use: No   Drug use: No     Family Hx: The patient's family history includes Coronary artery disease (age of onset: 33) in her father; Lupus in her mother.  ROS:   Please see the history of present illness.    She All other systems reviewed and are negative.   Prior Sleep studies:   The following studies were reviewed today:  PAP compliance download  Labs/Other Tests and Data Reviewed:     Recent Labs: 10/30/2021: BUN 10; Creatinine, Ser 1.00; Potassium 4.2; Sodium 137   Wt Readings from Last 3 Encounters:  08/21/22 278 lb 9.6 oz (126.4 kg)  12/27/21 262 lb (118.8 kg)  10/30/21 262 lb (118.8 kg)     Risk Assessment/Calculations:      Objective:    Vital Signs:  BP 112/80   Pulse 70   Ht 5\' 5"  (1.651 m)   Wt 278 lb 9.6 oz (126.4 kg)   SpO2 98%   BMI 46.36 kg/m   GEN: Well nourished, well developed in no acute distress HEENT: Normal NECK: No JVD; No carotid bruits LYMPHATICS: No lymphadenopathy CARDIAC:RRR, no murmurs, rubs, gallops RESPIRATORY:  Clear to auscultation without rales, wheezing or rhonchi  ABDOMEN: Soft, non-tender,  non-distended MUSCULOSKELETAL:  1+ LE  edema; No deformity  SKIN: Warm and dry NEUROLOGIC:  Alert and oriented x 3 PSYCHIATRIC:  Normal affect  ASSESSMENT & PLAN:    OSA - The patient is tolerating PAP therapy well without a1.4ny problems. The PAP download performed by his DME was personally reviewed and interpreted by  me today and showed an AHI of 10/hr on 1.4 cm H2O with 70% compliance in using more than 4 hours nightly.  The patient has been using and benefiting from PAP use and will continue to benefit from therapy.   HTN -BP controlled on exam today -Continue prescription drug management with... Toprol-XL 12.5 mg daily, lisinopril 30 mg daily with as needed refills  Chronic Shortness of breath  - 2D echo 11/2021 showed normal LV function and diastolic function.   - RHC showed normal right heart pressures 2020 - It is felt that her shortness of breath is more related to underlying morbid obesity as well as obstructive sleep apnea and possibly GERD.  - Lexiscan Myoview showed no ischemia 2020   Pulmonary hypertension  -this was noted in the past on echo but  right heart cath showed normal right heart pressures. -2D echo 11/2021 showed EF 65 to 70% with normal diastolic function and no pulmonary hypertension.  CAD -Coronary CTA 11/2021 showing a coronary Ca score of 4 with <25% RPDA, 25-49% mid to dLAD -no further CP -Continue prescription drug management with aspirin 81 mg daily, Toprol-XL 12.5 mg daily -She is currently not on statin therapy  HLD -LDL goal < 70 -I have personally reviewed and interpreted outside labs performed by patient's PCP which showed LDL 68 and HDL 43 in Aug 2023 -she currently is not on statin therapy -will repeat FLP and get a Lp(a)  Palpitations -she has been having these daily -I will get a 2 week ziopatch to assess for PAF  Weight gain/LE edema -likely related to the steroids she has been taking for the past 3 months -she is going to talk with her  rheumatologist -increase Demadex to 20mg  BID -check BMET in 1 week -she will let me know if her weight and LE edema do not improve  Medication Adjustments/Labs and Tests Ordered: Current medicines are reviewed at length with the patient today.  Concerns regarding medicines are outlined above.   Tests Ordered: No orders of the defined types were placed in this encounter.   Medication Changes: No orders of the defined types were placed in this encounter.   Follow Up:  In Person with extedner in 4 weeks and me in 6 months  Signed, Armanda Magic, MD  08/21/2022 9:32 AM     [facility]

## 2022-08-21 NOTE — Patient Instructions (Signed)
Medication Instructions:  Please increase your torsemide dose from 20 mg once a day to 20 mg TWICE a day.  *If you need a refill on your cardiac medications before your next appointment, please call your pharmacy*   Lab Work: Please schedule a time to come to our lab in one week and have a BMET, FASTING lipid lab, and L(p)a level drawn. You will need to be fasting for these labs.   If you have labs (blood work) drawn today and your tests are completely normal, you will receive your results only by: MyChart Message (if you have MyChart) OR A paper copy in the mail If you have any lab test that is abnormal or we need to change your treatment, we will call you to review the results.   Testing/Procedures: Christena Deem- Long Term Monitor Instructions  Your physician has requested you wear a ZIO patch monitor for 14 days.  This is a single patch monitor. [employer] supplies one patch monitor per enrollment. Additional stickers are not available. Please do not apply patch if you will be having a Nuclear Stress Test,  Echocardiogram, Cardiac CT, MRI, or Chest Xray during the period you would be wearing the  monitor. The patch cannot be worn during these tests. You cannot remove and re-apply the  ZIO XT patch monitor.  Your ZIO patch monitor will be mailed 3 day USPS to your address on file. It may take 3-5 days  to receive your monitor after you have been enrolled.  Once you have received your monitor, please review the enclosed instructions. Your monitor  has already been registered assigning a specific monitor serial # to you.  Billing and Patient Assistance Program Information  We have supplied [employer] with any of your insurance information on file for billing purposes. [employer] offers a sliding scale Patient Assistance Program for patients that do not have  insurance, or whose insurance does not completely cover the cost of the ZIO monitor.  You must apply for the Patient Assistance Program to  qualify for this discounted rate.  To apply, please call [employer] at [phone], select option 4, select option 2, ask to apply for  Patient Assistance Program. Meredeth Ide will ask your household income, and how many people  are in your household. They will quote your out-of-pocket cost based on that information.  [employer] will also be able to set up a 40-month, interest-free payment plan if needed.  Applying the monitor   Shave hair from upper left chest.  Hold abrader disc by orange tab. Rub abrader in 40 strokes over the upper left chest as  indicated in your monitor instructions.  Clean area with 4 enclosed alcohol pads. Let dry.  Apply patch as indicated in monitor instructions. Patch will be placed under collarbone on left  side of chest with arrow pointing upward.  Rub patch adhesive wings for 2 minutes. Remove white label marked "1". Remove the white  label marked "2". Rub patch adhesive wings for 2 additional minutes.  While looking in a mirror, press and release button in center of patch. A small green light will  flash 3-4 times. This will be your only indicator that the monitor has been turned on.  Do not shower for the first 24 hours. You may shower after the first 24 hours.  Press the button if you feel a symptom. You will hear a small click. Record Date, Time and  Symptom in the Patient Logbook.  When you are ready to remove the  patch, follow instructions on the last 2 pages of Patient  Logbook. Stick patch monitor onto the last page of Patient Logbook.  Place Patient Logbook in the blue and white box. Use locking tab on box and tape box closed  securely. The blue and white box has prepaid postage on it. Please place it in the mailbox as  soon as possible. Your physician should have your test results approximately 7 days after the  monitor has been mailed back to [facility].  Call [facility] Customer Care at [phone] if you have questions regarding  your ZIO XT  patch monitor. Call them immediately if you see an orange light blinking on your  monitor.  If your monitor falls off in less than 4 days, contact our Monitor department at [phone].  If your monitor becomes loose or falls off after 4 days call [employer] at [phone] for  suggestions on securing your monitor    Follow-Up: At [facility], you and your health needs are our priority.  As part of our continuing mission to provide you with exceptional heart care, we have created designated Provider Care Teams.  These Care Teams include your primary Cardiologist (physician) and Advanced Practice Providers (APPs -  Physician Assistants and Nurse Practitioners) who all work together to provide you with the care you need, when you need it.  We recommend signing up for the patient portal called "MyChart".  Sign up information is provided on this After Visit Summary.  MyChart is used to connect with patients for Virtual Visits (Telemedicine).  Patients are able to view lab/test results, encounter notes, upcoming appointments, etc.  Non-urgent messages can be sent to your provider as well.   To learn more about what you can do with MyChart, go to [email or web address].    Your next appointment:   4 week(s)  Provider:   Jari Favre, PA-C, Leily Searing, NP, Eligha Bridegroom, NP, or Tereso Newcomer, PA-C     Then, Armanda Magic, MD will plan to see you again in 6 month(s).

## 2022-08-21 NOTE — Progress Notes (Unsigned)
Enrolled for [employer] to mail a ZIO XT long term holter monitor to the patients address on file.  

## 2022-08-26 DIAGNOSIS — R002 Palpitations: Secondary | ICD-10-CM

## 2022-08-27 DIAGNOSIS — R21 Rash and other nonspecific skin eruption: Secondary | ICD-10-CM | POA: Diagnosis not present

## 2022-08-27 DIAGNOSIS — M797 Fibromyalgia: Secondary | ICD-10-CM | POA: Diagnosis not present

## 2022-08-27 DIAGNOSIS — M329 Systemic lupus erythematosus, unspecified: Secondary | ICD-10-CM | POA: Diagnosis not present

## 2022-08-27 DIAGNOSIS — M25561 Pain in right knee: Secondary | ICD-10-CM | POA: Diagnosis not present

## 2022-08-27 DIAGNOSIS — Z7952 Long term (current) use of systemic steroids: Secondary | ICD-10-CM | POA: Diagnosis not present

## 2022-08-27 DIAGNOSIS — M25562 Pain in left knee: Secondary | ICD-10-CM | POA: Diagnosis not present

## 2022-08-27 DIAGNOSIS — M25511 Pain in right shoulder: Secondary | ICD-10-CM | POA: Diagnosis not present

## 2022-08-28 ENCOUNTER — Ambulatory Visit: Payer: 59 | Attending: Cardiology

## 2022-08-28 ENCOUNTER — Telehealth: Payer: Self-pay | Admitting: Cardiology

## 2022-08-28 DIAGNOSIS — Z5181 Encounter for therapeutic drug level monitoring: Secondary | ICD-10-CM | POA: Diagnosis not present

## 2022-08-28 DIAGNOSIS — Z79899 Other long term (current) drug therapy: Secondary | ICD-10-CM

## 2022-08-28 NOTE — Telephone Encounter (Signed)
Pt c/o medication issue:  1. Name of Medication:   predniSONE (DELTASONE) 5 MG tablet   2. How are you currently taking this medication (dosage and times per day)?   3. Are you having a reaction (difficulty breathing--STAT)?   4. What is your medication issue?   Patient stated Dr. Dierdre Forth took her off of this medication yesterday.  Patient wants a copy of her lab results sent to Dr. Dierdre Forth at fax# [phone], ph# [phone].

## 2022-08-28 NOTE — Telephone Encounter (Signed)
Returned call to patient. She called to let us know Dr. Dierdre Forth discontinued her prednisone at her office visit with him yesterday. She also had labs drawn today for Dr. Mayford Knife and requests those lab results be sent to Dr. Dierdre Forth once they've been reviewed. Fax #: [phone].  Medication list updated. Informed patient we will send lab results to Dr. Shawnee Knapp office when they have been resulted and reviewed.  Patient verbalized understanding and expressed appreciation for follow-up.

## 2022-08-29 ENCOUNTER — Other Ambulatory Visit: Payer: Self-pay | Admitting: *Deleted

## 2022-08-29 ENCOUNTER — Telehealth: Payer: Self-pay | Admitting: Cardiology

## 2022-08-29 DIAGNOSIS — E785 Hyperlipidemia, unspecified: Secondary | ICD-10-CM

## 2022-08-29 DIAGNOSIS — I2510 Atherosclerotic heart disease of native coronary artery without angina pectoris: Secondary | ICD-10-CM

## 2022-08-29 LAB — LIPID PANEL
Chol/HDL Ratio: 3.3 ratio (ref 0.0–4.4)
HDL: 52 mg/dL (ref >39)
Triglycerides: 149 mg/dL (ref 0–149)

## 2022-08-29 LAB — LIPOPROTEIN A (LPA)

## 2022-08-29 LAB — BASIC METABOLIC PANEL: Potassium: 3.7 mmol/L (ref 3.5–5.2)

## 2022-08-29 MED ORDER — ATORVASTATIN CALCIUM 10 MG PO TABS: 10.0000 mg | ORAL_TABLET | Freq: Every day | ORAL | 3 refills | Status: DC

## 2022-08-29 NOTE — Telephone Encounter (Signed)
See lab results ./cy 

## 2022-08-29 NOTE — Telephone Encounter (Signed)
Patient returned LPN's call regarding results. 

## 2022-08-30 LAB — BASIC METABOLIC PANEL
BUN/Creatinine Ratio: 17 (ref 12–28)
BUN: 16 mg/dL (ref 8–27)
CO2: 28 mmol/L (ref 20–29)
Calcium: 9.4 mg/dL (ref 8.7–10.3)
Chloride: 98 mmol/L (ref 96–106)
Creatinine, Ser: 0.94 mg/dL (ref 0.57–1.00)
Glucose: 91 mg/dL (ref 70–99)
Sodium: 142 mmol/L (ref 134–144)
eGFR: 69 mL/min/{1.73_m2} (ref >59)

## 2022-08-30 LAB — LIPID PANEL
Cholesterol, Total: 174 mg/dL (ref 100–199)
LDL Chol Calc (NIH): 96 mg/dL (ref 0–99)
VLDL Cholesterol Cal: 26 mg/dL (ref 5–40)

## 2022-09-12 ENCOUNTER — Telehealth: Payer: Self-pay

## 2022-09-12 DIAGNOSIS — R002 Palpitations: Secondary | ICD-10-CM | POA: Diagnosis not present

## 2022-09-12 DIAGNOSIS — I272 Pulmonary hypertension, unspecified: Secondary | ICD-10-CM

## 2022-09-12 DIAGNOSIS — I10 Essential (primary) hypertension: Secondary | ICD-10-CM

## 2022-09-12 MED ORDER — METOPROLOL SUCCINATE ER 25 MG PO TB24
25.0000 mg | ORAL_TABLET | Freq: Every day | ORAL | 3 refills | Status: DC
Start: 2022-09-12 — End: 2023-11-03

## 2022-09-12 NOTE — Telephone Encounter (Signed)
Per Dr. Mayford Knife, Heart monitor showed runs of fast heart rhythm from top of heart called atrial tachycardia that lasts as long as 9 beats in a row.  Please increase Toprol XL to 25mg  daily and followup with Jari Favre in 4 weeks, has follow-up with Tereso Newcomer PA next week.  Patient will keep her appt next week. Will put in order for increased dose.

## 2022-09-18 ENCOUNTER — Encounter: Payer: Self-pay | Admitting: Physician Assistant

## 2022-09-18 DIAGNOSIS — I4719 Other supraventricular tachycardia: Secondary | ICD-10-CM

## 2022-09-18 HISTORY — DX: Other supraventricular tachycardia: I47.19

## 2022-09-18 NOTE — Progress Notes (Signed)
Cardiology Office Note:    Date:  09/19/2022  ID:  Kylie [name], DOB 1960-04-14, MRN [id number] PCP: Kirstie Peri, MD  [facility] Providers Cardiologist:  Armanda Magic, MD       Patient Profile:      (HFpEF) heart failure with preserved ejection fraction  RHC 07/26/18: mean Pa 18, CO 6.7, CI 2.9, PVR 1.3 WU - normal filling pressures  TTE 11/20/21: EF 65-70, no RWMA, low NL RVSF, NL PASP (RVSP 26.4), Asc Ao 39 mm, RAP 3 Pulmonary hypertension  Chronic shortness of breath  EF normal in 2023, RHC w normal R heart pressures in 2020 (due to obesity, OSA, ?GERD) Coronary artery disease (nonobstructive) Myoview 08/18/18: no ischemia, EF 68, low risk  CCTA 11/20/21: CAC score (61st %), LAD mid 25-49 Atrial tachycardia Monitor 08/2022: NSR, Avg HR 74; several short runs of ATach (9 beats, 141 bpm), Rare PVCs/PACs>>beta-blocker ?  Dilated Ascending Aorta TTE 11/2021: 39 mm  CCTA 11/2021: Aorta normal size  Hypertension  Hyperlipidemia  Obesity  Lupus OSA on CPAP  Chronic leg edema  Tobacco use       History of Present Illness:   Kylie [name] is a 62 y.o. female who returns for follow up. She was last seen by Dr. Mayford Knife 08/21/22 for palpitations and increased leg edema. Her Torsemide was increased to 20 mg twice daily. A Zio monitor showed short runs of ATach. Her Metoprolol succinate was increased to 25 mg once daily.  She is here alone.  She has not noticed much change in her lower extremity edema.  She does note that her lower extremity edema is almost resolved in the mornings.  It worsens throughout the day.  She has chronic shortness of breath.  She is NYHA IIb.  She sleeps on 2 pillows.  She uses CPAP at night.  She has occasional chest discomfort.  This is chronic without change.  She has not had syncope.  Review of Systems  Gastrointestinal:  Negative for hematochezia and melena.  Genitourinary:  Negative for hematuria.   see HPI    Studies Reviewed:       Risk  Assessment/Calculations:             Physical Exam:   VS:  BP 122/68   Pulse 86   Ht 5\' 5"  (1.651 m)   Wt 284 lb 3.2 oz (128.9 kg)   SpO2 96%   BMI 47.29 kg/m    Wt Readings from Last 3 Encounters:  09/19/22 284 lb 3.2 oz (128.9 kg)  08/21/22 278 lb 9.6 oz (126.4 kg)  12/27/21 262 lb (118.8 kg)    Constitutional:      Appearance: Healthy appearance. Not in distress.  Neck:     Vascular: No JVR. JVD normal.  Pulmonary:     Breath sounds: Normal breath sounds. No wheezing. No rales.  Cardiovascular:     Normal rate. Regular rhythm.     Murmurs: There is no murmur.  Edema:    Peripheral edema present.    Pretibial: bilateral 2+ edema of the pretibial area. Abdominal:     Palpations: Abdomen is soft.      ASSESSMENT AND PLAN:   Lower extremity edema I suspect her lower extremity edema is mainly related to venous insufficiency.  She notes improvement with elevation and worsening with standing/ambulation.  She was recently taken off of prednisone by rheumatology.  Hopefully, this will continue to improve her symptoms as well.  I reviewed her chart.  In 2020 she was admitted for suspected volume overload.  She had normal filling pressures on right heart cath.  She also had evidence of dehydration.  We discussed the importance of weight loss to help improve her symptoms.  I offered to refer her to the healthy weight and wellness clinic.  She would like to hold off on this for now.  Continue current dose of torsemide 20 mg twice daily.  Obtain follow-up BMET, BNP today.  If BNP is significantly elevated, I will adjust her torsemide further and arrange earlier follow-up.  Otherwise, follow-up with Dr. Mayford Knife in November as planned.  (HFpEF) heart failure with preserved ejection fraction (HCC) EF 65-70 by echocardiogram in September 2023.  NYHA IIb-III.  She has lower extremity edema that seems to be related to venous insufficiency.  Otherwise, her lungs are clear and neck veins are flat.   Overall, volume status appears to be stable.  She does note chronic shortness of breath.  Obtain follow-up BMET, BNP today.  Continue torsemide 20 mg twice daily.  Adjust torsemide if BNP elevated.  Atrial tachycardia She has had less palpitations since increasing the dose of metoprolol succinate to 25 mg daily.  Continue metoprolol succinate 25 mg daily.  Follow-up as planned.  CAD (coronary artery disease), native coronary artery Mild nonobstructive disease by coronary CTA in September 2023.  She has chronic chest pain.  This is unchanged.  She was recently placed on atorvastatin 10 mg daily due to elevated LDL.  She has follow-up labs in several weeks.  Continue ASA 81 mg daily.  Essential hypertension Blood pressure is controlled.  Continue lisinopril 30 mg daily, metoprolol succinate 25 mg daily.    Dispo:  Return in 20 weeks (on 02/06/2023) for Scheduled Follow Up with Dr. Mayford Knife.  Signed, Tereso Newcomer, PA-C

## 2022-09-19 ENCOUNTER — Ambulatory Visit: Payer: 59 | Attending: Physician Assistant | Admitting: Physician Assistant

## 2022-09-19 ENCOUNTER — Encounter: Payer: Self-pay | Admitting: Physician Assistant

## 2022-09-19 VITALS — BP 122/68 | HR 86 | Ht 65.0 in | Wt 284.2 lb

## 2022-09-19 DIAGNOSIS — R0602 Shortness of breath: Secondary | ICD-10-CM | POA: Diagnosis not present

## 2022-09-19 DIAGNOSIS — I5032 Chronic diastolic (congestive) heart failure: Secondary | ICD-10-CM | POA: Diagnosis not present

## 2022-09-19 DIAGNOSIS — R600 Localized edema: Secondary | ICD-10-CM | POA: Diagnosis not present

## 2022-09-19 DIAGNOSIS — I4719 Other supraventricular tachycardia: Secondary | ICD-10-CM

## 2022-09-19 DIAGNOSIS — I10 Essential (primary) hypertension: Secondary | ICD-10-CM

## 2022-09-19 DIAGNOSIS — I2510 Atherosclerotic heart disease of native coronary artery without angina pectoris: Secondary | ICD-10-CM | POA: Diagnosis not present

## 2022-09-19 NOTE — Assessment & Plan Note (Signed)
She has had less palpitations since increasing the dose of metoprolol succinate to 25 mg daily.  Continue metoprolol succinate 25 mg daily.  Follow-up as planned.

## 2022-09-19 NOTE — Assessment & Plan Note (Signed)
Blood pressure is controlled.  Continue lisinopril 30 mg daily, metoprolol succinate 25 mg daily.

## 2022-09-19 NOTE — Patient Instructions (Signed)
Medication Instructions:  Your physician recommends that you continue on your current medications as directed. Please refer to the Current Medication list given to you today.  *If you need a refill on your cardiac medications before your next appointment, please call your pharmacy*  Lab Work: TODAY: BMET, BNP If you have labs (blood work) drawn today and your tests are completely normal, you will receive your results only by: MyChart Message (if you have MyChart) OR A paper copy in the mail If you have any lab test that is abnormal or we need to change your treatment, we will call you to review the results.  Testing/Procedures: None ordered today.  Follow-Up: At [facility], you and your health needs are our priority.  As part of our continuing mission to provide you with exceptional heart care, we have created designated Provider Care Teams.  These Care Teams include your primary Cardiologist (physician) and Advanced Practice Providers (APPs -  Physician Assistants and Nurse Practitioners) who all work together to provide you with the care you need, when you need it.  Your next appointment:   4 month(s)  The format for your next appointment:   In Person  Provider:   Armanda Magic, MD {

## 2022-09-19 NOTE — Assessment & Plan Note (Signed)
Mild nonobstructive disease by coronary CTA in September 2023.  She has chronic chest pain.  This is unchanged.  She was recently placed on atorvastatin 10 mg daily due to elevated LDL.  She has follow-up labs in several weeks.  Continue ASA 81 mg daily.

## 2022-09-19 NOTE — Assessment & Plan Note (Signed)
I suspect her lower extremity edema is mainly related to venous insufficiency.  She notes improvement with elevation and worsening with standing/ambulation.  She was recently taken off of prednisone by rheumatology.  Hopefully, this will continue to improve her symptoms as well.  I reviewed her chart.  In 2020 she was admitted for suspected volume overload.  She had normal filling pressures on right heart cath.  She also had evidence of dehydration.  We discussed the importance of weight loss to help improve her symptoms.  I offered to refer her to the healthy weight and wellness clinic.  She would like to hold off on this for now.  Continue current dose of torsemide 20 mg twice daily.  Obtain follow-up BMET, BNP today.  If BNP is significantly elevated, I will adjust her torsemide further and arrange earlier follow-up.  Otherwise, follow-up with Dr. Mayford Knife in November as planned.

## 2022-09-19 NOTE — Assessment & Plan Note (Signed)
EF 65-70 by echocardiogram in September 2023.  NYHA IIb-III.  She has lower extremity edema that seems to be related to venous insufficiency.  Otherwise, her lungs are clear and neck veins are flat.  Overall, volume status appears to be stable.  She does note chronic shortness of breath.  Obtain follow-up BMET, BNP today.  Continue torsemide 20 mg twice daily.  Adjust torsemide if BNP elevated.

## 2022-09-20 LAB — BASIC METABOLIC PANEL
BUN/Creatinine Ratio: 11 — ABNORMAL LOW (ref 12–28)
BUN: 10 mg/dL (ref 8–27)
CO2: 29 mmol/L (ref 20–29)
Calcium: 9.1 mg/dL (ref 8.7–10.3)
Chloride: 96 mmol/L (ref 96–106)
Creatinine, Ser: 0.87 mg/dL (ref 0.57–1.00)
Glucose: 83 mg/dL (ref 70–99)
Potassium: 3.4 mmol/L — ABNORMAL LOW (ref 3.5–5.2)
Sodium: 137 mmol/L (ref 134–144)
eGFR: 75 mL/min/{1.73_m2} (ref >59)

## 2022-09-20 LAB — PRO B NATRIURETIC PEPTIDE: NT-Pro BNP: 105 pg/mL (ref 0–287)

## 2022-09-23 ENCOUNTER — Telehealth: Payer: Self-pay | Admitting: Cardiology

## 2022-09-23 DIAGNOSIS — E876 Hypokalemia: Secondary | ICD-10-CM

## 2022-09-23 DIAGNOSIS — R600 Localized edema: Secondary | ICD-10-CM

## 2022-09-23 MED ORDER — POTASSIUM CHLORIDE CRYS ER 20 MEQ PO TBCR: 20.0000 meq | EXTENDED_RELEASE_TABLET | Freq: Two times a day (BID) | ORAL | 6 refills | Status: AC

## 2022-09-23 NOTE — Telephone Encounter (Signed)
Patient states she is returning call. Please advise  

## 2022-09-23 NOTE — Telephone Encounter (Signed)
-----   Message from Beatrice Lecher, [place] sent at 09/21/2022  4:34 PM EDT ----- Results sent to Waylan Boga via MyChart. See MyChart comments below. PLAN:  -Start K+ 20 mEq twice daily -BMET 1 week  Ms. Stoutenburg  Your kidney function (creatinine) is normal. Your potassium is low. Your congestive heart failure test (NT Pro BNP) is normal which indicates no signs of excess fluid. Your leg swelling is likely mainly related to venous insufficiency (the veins in your legs do not do a good job at returning blood to your heart). Continue your current medications. I will start you on potassium to get your potassium back to normal. We will check labs in a week to recheck your potassium. Try to wear compression hose and elevate your legs when sitting, as this will help your leg swelling the most.  Tereso Newcomer, PA-C

## 2022-09-23 NOTE — Telephone Encounter (Signed)
Patient notified.  Prescription sent to [facility].  Patient will come in for lab work on 09/30/22

## 2022-09-26 ENCOUNTER — Telehealth: Payer: Self-pay | Admitting: Physician Assistant

## 2022-09-26 DIAGNOSIS — Z79899 Other long term (current) drug therapy: Secondary | ICD-10-CM

## 2022-09-26 NOTE — Telephone Encounter (Signed)
Patient states last night she noticed she had what felt like a hot flash that last a few minutes and then subsided. She states this happened again this morning when she got up to get a drink, she started sweating. She reports this lasted 10-15 minutes and then went away.  She otherwise feels fine, but is unsure if this is a hot flash or possibly the potassium supplement she was started on yesterday.  Patient would like to know if she should continue potassium supplement, or if she can stop the medication and eat potassium-rich foods instead. Lab appt scheduled for 09/30/22 to recheck BMP.  Will forward to [facility], PA-C to review and advise.

## 2022-09-26 NOTE — Telephone Encounter (Signed)
Pt c/o medication issue:  1. Name of Medication: potassium chloride SA (KLOR-CON M) 20 MEQ tablet   2. How are you currently taking this medication (dosage and times per day)? As written  3. Are you having a reaction (difficulty breathing--STAT)? No   4. What is your medication issue? Pt called in stating since taking this medication, she has been sweating excessively. She states it's not like a hot flash, but she just gets really hot. Please advise.

## 2022-09-26 NOTE — Telephone Encounter (Signed)
Left message for patient with Scott's recommendation: Ok to hold K+ tablet, try to increase dietary K+ and repeat BMET next week. Tereso Newcomer, PA-C      Provided office number for callback.

## 2022-09-26 NOTE — Telephone Encounter (Signed)
Ok to hold K+ tablet, try to increase dietary K+ and repeat BMET next week. Tereso Newcomer, PA-C    09/26/2022 1:16 PM

## 2022-09-30 ENCOUNTER — Ambulatory Visit: Payer: 59 | Attending: Cardiovascular Disease

## 2022-09-30 DIAGNOSIS — E876 Hypokalemia: Secondary | ICD-10-CM | POA: Diagnosis not present

## 2022-09-30 DIAGNOSIS — R600 Localized edema: Secondary | ICD-10-CM | POA: Diagnosis not present

## 2022-10-01 ENCOUNTER — Telehealth: Payer: Self-pay | Admitting: Cardiology

## 2022-10-01 DIAGNOSIS — E876 Hypokalemia: Secondary | ICD-10-CM

## 2022-10-01 LAB — BASIC METABOLIC PANEL
BUN/Creatinine Ratio: 11 — ABNORMAL LOW (ref 12–28)
BUN: 10 mg/dL (ref 8–27)
CO2: 28 mmol/L (ref 20–29)
Calcium: 9.9 mg/dL (ref 8.7–10.3)
Chloride: 98 mmol/L (ref 96–106)
Creatinine, Ser: 0.93 mg/dL (ref 0.57–1.00)
Glucose: 96 mg/dL (ref 70–99)
Potassium: 4.6 mmol/L (ref 3.5–5.2)
Sodium: 139 mmol/L (ref 134–144)
eGFR: 69 mL/min/{1.73_m2} (ref >59)

## 2022-10-01 NOTE — Telephone Encounter (Signed)
Spoke with the patient who states that she has been having side effects from her potassium. She called in about this last week and was advised that she could hold her potassium. She continued to take it until yesterday after she had her labs drawn. Advised her to hold potassium and increase her dietary intake of potassium rich foods. Patient verbalized understanding. Repeat BMET added on to labs already scheduled for next Friday 2/26.

## 2022-10-01 NOTE — Telephone Encounter (Signed)
Agree. Repeat BMET 7/26. Tereso Newcomer, PA-C    10/01/2022 4:48 PM

## 2022-10-01 NOTE — Telephone Encounter (Signed)
Pt c/o medication issue:  1. Name of Medication:   potassium chloride SA (KLOR-CON M) 20 MEQ tablet    2. How are you currently taking this medication (dosage and times per day)? Take 1 tablet (20 mEq total) by mouth 2 (two) times daily.   3. Are you having a reaction (difficulty breathing--STAT)? No  4. What is your medication issue? Pt states the above medication causes her to be nauseous and her legs/ arms are hurting. Please advise

## 2022-10-03 DIAGNOSIS — R070 Pain in throat: Secondary | ICD-10-CM | POA: Diagnosis not present

## 2022-10-03 DIAGNOSIS — J029 Acute pharyngitis, unspecified: Secondary | ICD-10-CM | POA: Diagnosis not present

## 2022-10-03 DIAGNOSIS — Z299 Encounter for prophylactic measures, unspecified: Secondary | ICD-10-CM | POA: Diagnosis not present

## 2022-10-06 NOTE — Progress Notes (Signed)
Pt has been made aware of normal result and verbalized understanding.  jw

## 2022-10-10 ENCOUNTER — Ambulatory Visit: Payer: 59 | Attending: Physician Assistant

## 2022-10-10 DIAGNOSIS — I2510 Atherosclerotic heart disease of native coronary artery without angina pectoris: Secondary | ICD-10-CM

## 2022-10-10 DIAGNOSIS — E876 Hypokalemia: Secondary | ICD-10-CM | POA: Diagnosis not present

## 2022-10-10 DIAGNOSIS — E785 Hyperlipidemia, unspecified: Secondary | ICD-10-CM | POA: Diagnosis not present

## 2022-10-10 LAB — LIPID PANEL

## 2022-10-10 LAB — BASIC METABOLIC PANEL
BUN: 11 mg/dL (ref 8–27)
CO2: 29 mmol/L (ref 20–29)
Calcium: 9 mg/dL (ref 8.7–10.3)
Chloride: 101 mmol/L (ref 96–106)
Glucose: 99 mg/dL (ref 70–99)
Potassium: 3.4 mmol/L — ABNORMAL LOW (ref 3.5–5.2)

## 2022-10-10 LAB — ALT

## 2022-10-11 LAB — BASIC METABOLIC PANEL: Sodium: 140 mmol/L (ref 134–144)

## 2022-10-13 ENCOUNTER — Telehealth: Payer: Self-pay

## 2022-10-13 ENCOUNTER — Telehealth: Payer: Self-pay | Admitting: Cardiology

## 2022-10-13 NOTE — Telephone Encounter (Signed)
-----   Message from Armanda Magic sent at 10/13/2022  8:02 AM EDT ----- Lipids at goal continue current therapy and forward to PCP

## 2022-10-13 NOTE — Telephone Encounter (Signed)
Called to discuss lab results, no answer. Left message with no identifiers asking patient to call our office.

## 2022-10-13 NOTE — Telephone Encounter (Signed)
Pt returning nurses phone call. Please advise ?

## 2022-10-13 NOTE — Telephone Encounter (Signed)
Returned patient's call regarding Echo, left message with direct number for callback.

## 2022-10-14 ENCOUNTER — Telehealth: Payer: Self-pay

## 2022-10-14 NOTE — Telephone Encounter (Signed)
Called patient to discuss lab results, no answer. Left message asking recipient to call our office.

## 2022-10-14 NOTE — Telephone Encounter (Signed)
-----   Message from Armanda Magic sent at 10/13/2022  8:02 AM EDT ----- Lipids at goal continue current therapy and forward to PCP

## 2022-10-14 NOTE — Telephone Encounter (Signed)
Pt agreeable to restarting potassium and coming in for labs on 8/14

## 2022-10-14 NOTE — Addendum Note (Signed)
Addended by: Vernard Gambles on: 10/14/2022 03:26 PM   Modules accepted: Orders

## 2022-10-14 NOTE — Telephone Encounter (Signed)
-----   Message from Jacolyn Reedy sent at 10/14/2022  8:27 AM EDT ----- She could try dissolving it in a little bit of water or eating it with applesauce or yogurt but there is no other way to increase potassium if she's already eating a high potassium diet. ----- Message ----- From: Michaelle Copas, CMA Sent: 10/13/2022   1:28 PM EDT To: Beatrice Lecher, PA-C; Dyann Kief, PA-C  Spoke with patient who states that when she was on the potassium it made her feel bad. Patient asked if there was something else she can try? She states she tries to eat potassium rich food but that is not helping her. Please advise.

## 2022-10-14 NOTE — Telephone Encounter (Signed)
Called patient to discuss recent labs, patient verbalizes understanding that Lipids at goal and to continue current therapy. Patient verbalizes understanding, labs forwarded to PCP.

## 2022-10-28 DIAGNOSIS — I5032 Chronic diastolic (congestive) heart failure: Secondary | ICD-10-CM | POA: Diagnosis not present

## 2022-10-28 DIAGNOSIS — Z299 Encounter for prophylactic measures, unspecified: Secondary | ICD-10-CM | POA: Diagnosis not present

## 2022-10-28 DIAGNOSIS — L03312 Cellulitis of back [any part except buttock]: Secondary | ICD-10-CM | POA: Diagnosis not present

## 2022-10-28 DIAGNOSIS — Z7189 Other specified counseling: Secondary | ICD-10-CM | POA: Diagnosis not present

## 2022-10-28 DIAGNOSIS — Z0000 Encounter for general adult medical examination without abnormal findings: Secondary | ICD-10-CM | POA: Diagnosis not present

## 2022-10-29 ENCOUNTER — Ambulatory Visit: Payer: 59 | Attending: Physician Assistant

## 2022-10-29 DIAGNOSIS — Z79899 Other long term (current) drug therapy: Secondary | ICD-10-CM | POA: Diagnosis not present

## 2022-11-11 DIAGNOSIS — Z299 Encounter for prophylactic measures, unspecified: Secondary | ICD-10-CM | POA: Diagnosis not present

## 2022-11-11 DIAGNOSIS — I10 Essential (primary) hypertension: Secondary | ICD-10-CM | POA: Diagnosis not present

## 2022-11-11 DIAGNOSIS — R112 Nausea with vomiting, unspecified: Secondary | ICD-10-CM | POA: Diagnosis not present

## 2022-11-11 DIAGNOSIS — I5032 Chronic diastolic (congestive) heart failure: Secondary | ICD-10-CM | POA: Diagnosis not present

## 2022-11-13 DIAGNOSIS — Z1211 Encounter for screening for malignant neoplasm of colon: Secondary | ICD-10-CM | POA: Diagnosis not present

## 2022-11-18 DIAGNOSIS — G4733 Obstructive sleep apnea (adult) (pediatric): Secondary | ICD-10-CM | POA: Diagnosis not present

## 2022-11-19 ENCOUNTER — Other Ambulatory Visit: Payer: Self-pay | Admitting: Cardiology

## 2022-11-19 DIAGNOSIS — I272 Pulmonary hypertension, unspecified: Secondary | ICD-10-CM

## 2022-11-19 DIAGNOSIS — I10 Essential (primary) hypertension: Secondary | ICD-10-CM

## 2022-12-09 DIAGNOSIS — Z23 Encounter for immunization: Secondary | ICD-10-CM | POA: Diagnosis not present

## 2022-12-09 DIAGNOSIS — Z0000 Encounter for general adult medical examination without abnormal findings: Secondary | ICD-10-CM | POA: Diagnosis not present

## 2022-12-09 DIAGNOSIS — Z299 Encounter for prophylactic measures, unspecified: Secondary | ICD-10-CM | POA: Diagnosis not present

## 2022-12-09 DIAGNOSIS — Z79899 Other long term (current) drug therapy: Secondary | ICD-10-CM | POA: Diagnosis not present

## 2022-12-09 DIAGNOSIS — E7800 Pure hypercholesterolemia, unspecified: Secondary | ICD-10-CM | POA: Diagnosis not present

## 2022-12-09 DIAGNOSIS — R5383 Other fatigue: Secondary | ICD-10-CM | POA: Diagnosis not present

## 2022-12-09 DIAGNOSIS — I10 Essential (primary) hypertension: Secondary | ICD-10-CM | POA: Diagnosis not present

## 2022-12-24 DIAGNOSIS — Z299 Encounter for prophylactic measures, unspecified: Secondary | ICD-10-CM | POA: Diagnosis not present

## 2022-12-24 DIAGNOSIS — J441 Chronic obstructive pulmonary disease with (acute) exacerbation: Secondary | ICD-10-CM | POA: Diagnosis not present

## 2022-12-24 DIAGNOSIS — R058 Other specified cough: Secondary | ICD-10-CM | POA: Diagnosis not present

## 2023-02-05 DIAGNOSIS — I10 Essential (primary) hypertension: Secondary | ICD-10-CM | POA: Diagnosis not present

## 2023-02-05 DIAGNOSIS — Z299 Encounter for prophylactic measures, unspecified: Secondary | ICD-10-CM | POA: Diagnosis not present

## 2023-02-05 DIAGNOSIS — R109 Unspecified abdominal pain: Secondary | ICD-10-CM | POA: Diagnosis not present

## 2023-02-05 DIAGNOSIS — W57XXXA Bitten or stung by nonvenomous insect and other nonvenomous arthropods, initial encounter: Secondary | ICD-10-CM | POA: Diagnosis not present

## 2023-02-05 DIAGNOSIS — I5032 Chronic diastolic (congestive) heart failure: Secondary | ICD-10-CM | POA: Diagnosis not present

## 2023-02-06 ENCOUNTER — Ambulatory Visit: Payer: 59 | Attending: Cardiology | Admitting: Cardiology

## 2023-02-06 ENCOUNTER — Encounter: Payer: Self-pay | Admitting: Cardiology

## 2023-02-06 VITALS — BP 108/80 | HR 57 | Ht 65.0 in | Wt 257.0 lb

## 2023-02-06 DIAGNOSIS — I4719 Other supraventricular tachycardia: Secondary | ICD-10-CM | POA: Diagnosis not present

## 2023-02-06 DIAGNOSIS — E785 Hyperlipidemia, unspecified: Secondary | ICD-10-CM

## 2023-02-06 DIAGNOSIS — R0602 Shortness of breath: Secondary | ICD-10-CM | POA: Diagnosis not present

## 2023-02-06 DIAGNOSIS — I10 Essential (primary) hypertension: Secondary | ICD-10-CM | POA: Diagnosis not present

## 2023-02-06 DIAGNOSIS — G4733 Obstructive sleep apnea (adult) (pediatric): Secondary | ICD-10-CM

## 2023-02-06 DIAGNOSIS — I272 Pulmonary hypertension, unspecified: Secondary | ICD-10-CM

## 2023-02-06 DIAGNOSIS — I2510 Atherosclerotic heart disease of native coronary artery without angina pectoris: Secondary | ICD-10-CM

## 2023-02-06 DIAGNOSIS — Z79899 Other long term (current) drug therapy: Secondary | ICD-10-CM

## 2023-02-06 NOTE — Addendum Note (Signed)
Addended by: Luellen Pucker on: 02/06/2023 10:07 AM   Modules accepted: Orders

## 2023-02-06 NOTE — Patient Instructions (Addendum)
Medication Instructions:  Your physician recommends that you continue on your current medications as directed. Please refer to the Current Medication list given to you today.  *If you need a refill on your cardiac medications before your next appointment, please call your pharmacy*   Lab Work: Please complete a BMET in our lab before you leave.  If you have labs (blood work) drawn today and your tests are completely normal, you will receive your results only by: MyChart Message (if you have MyChart) OR A paper copy in the mail If you have any lab test that is abnormal or we need to change your treatment, we will call you to review the results.   Testing/Procedures: None.    Follow-Up: At [facility], you and your health needs are our priority.  As part of our continuing mission to provide you with exceptional heart care, we have created designated Provider Care Teams.  These Care Teams include your primary Cardiologist (physician) and Advanced Practice Providers (APPs -  Physician Assistants and Nurse Practitioners) who all work together to provide you with the care you need, when you need it.  We recommend signing up for the patient portal called "MyChart".  Sign up information is provided on this After Visit Summary.  MyChart is used to connect with patients for Virtual Visits (Telemedicine).  Patients are able to view lab/test results, encounter notes, upcoming appointments, etc.  Non-urgent messages can be sent to your provider as well.   To learn more about what you can do with MyChart, go to [email or web address].    Your next appointment:   1 year(s)  Provider:   Armanda Magic, MD

## 2023-02-06 NOTE — Progress Notes (Signed)
Date:  02/06/2023   ID:  Kylie [name], DOB [birth year], MRN [id number] The patient was identified using 2 identifiers.  PCP:  Kirstie Peri, MD   [facility] Providers Cardiologist:  Armanda Magic, MD     Evaluation Performed:  Follow-Up Visit  Chief Complaint:  OSA, CAD, HTN, PHTN, SOB  History of Present Illness:    Kylie [name] is a 62 y.o. female with  a hx of Chronic diastolic CHF, pulmonary HTN, obesity lupus, OSA on PAP who has been having problems with DOE and LE edema over the past few months.  It did not respond to diuretics and she underwent 2D echo showing normal LVF with normal BNP.  It was felt that her SOB and LE edema were related to obesity and OSA.  Heart cath showed normal right heart pressures.  Her diuretics were stopped.  Lexiscan myoview showed no ischemia.  Later due to CP a coronary CTA was done 11/2021 showing a coronary Ca score of 4.  There is <25% RPDA, 25-49% mid to dLAD.  2D echo was also normal.  She had a Zio patch done for palpitations which showed nonsustained atrial tachycardia up to 9 beats in a row and rare PACs and PVCs.  She is here today for followup and is doing well.  She denies any chest pain or pressure, SOB, DOE, PND, orthopnea, LE edema, dizziness, palpitations or syncope. She is compliant with her meds and is tolerating meds with no SE.    She is doing well with her PAP device and thinks that she has gotten used to it.  She tolerates the mask and feels the pressure is adequate.  Since going on PAP she feels rested in the am and has no significant daytime sleepiness.  She denies any significant mouth or nasal dryness or nasal congestion.  She does not think that he snores.    Past Medical History:  Diagnosis Date   Ascending aorta dilatation (HCC)    39mm by echo 9/23   Atrial tachycardia (HCC) 09/18/2022   Monitor 08/2022: NSR, Avg HR 74; several short runs of ATach (9 beats, 141 bpm), Rare PVCs/PACs>>beta-blocker ?   CAD  (coronary artery disease), native coronary artery    Coronary CTA 11/2021  showed coronary Ca score of 4.  There is <25% RPDA, 25-49% mid to dLAD   Fibromyalgia    Hearing loss    Hypertension    Obesity (BMI 30-39.9) 04/14/2014   OSA (obstructive sleep apnea) 02/07/2015   Severe with AHI 36/hr now on CPAP at 10cm H2O   Pulmonary HTN (HCC) 04/14/2014   Resolved by 2D echo 2020   Sinus tachycardia 04/14/2014   SLE (systemic lupus erythematosus) (HCC)    Tobacco use    Past Surgical History:  Procedure Laterality Date   CESAREAN SECTION     CHOLECYSTECTOMY     COLONOSCOPY N/A 07/09/2016   Procedure: COLONOSCOPY;  Surgeon: West Bali, MD;  Location: [facility];  Service: Endoscopy;  Laterality: N/A;  9:00am   RIGHT HEART CATH N/A 07/26/2018   Procedure: RIGHT HEART CATH;  Surgeon: Dolores Patty, MD;  Location: [facility] INVASIVE CV LAB;  Service: Cardiovascular;  Laterality: N/A;   RIGHT HEART CATHETERIZATION N/A 05/31/2014   Procedure: RIGHT HEART CATH;  Surgeon: Laurey Morale, MD;  Location: [facility] CATH LAB;  Service: Cardiovascular;  Laterality: N/A;     Current Meds  Medication Sig   acetaminophen (TYLENOL) 500  MG tablet Take 500 mg by mouth as needed for moderate pain.    Ascorbic Acid (VITAMIN C PO) Take 1 tablet by mouth daily.   aspirin EC 81 MG tablet Take 1 tablet (81 mg total) by mouth daily. Swallow whole.   atorvastatin (LIPITOR) 10 MG tablet Take 1 tablet (10 mg total) by mouth daily.   chlorzoxazone (PARAFON) 500 MG tablet Take 500 mg by mouth 4 (four) times daily as needed for muscle spasms.   Cholecalciferol (VITAMIN D3 PO) Take 1 tablet by mouth daily.   hydroxychloroquine (PLAQUENIL) 200 MG tablet Take 400 mg by mouth daily.   lisinopril (ZESTRIL) 30 MG tablet TAKE ONE TABLET ONCE DAILY   metoprolol succinate (TOPROL-XL) 25 MG 24 hr tablet Take 1 tablet (25 mg total) by mouth daily.   mupirocin ointment (BACTROBAN) 2 % Apply topically 2 (two) times daily.    Omega-3 Fatty Acids (FISH OIL PO) Take 1 tablet by mouth daily.   omeprazole (PRILOSEC) 40 MG capsule Take 40 mg by mouth daily.   PARoxetine (PAXIL) 40 MG tablet Take 40 mg by mouth every evening.    torsemide (DEMADEX) 20 MG tablet Take 1 tablet (20 mg total) by mouth 2 (two) times daily.   traMADol (ULTRAM) 50 MG tablet Take 50 mg by mouth every 6 (six) hours as needed.   traZODone (DESYREL) 100 MG tablet Take 100 mg by mouth at bedtime.   VITAMIN E PO Take 1 tablet by mouth daily.     Allergies:   Ibuprofen, Amoxicillin, and Sulfa antibiotics   Social History   Tobacco Use   Smoking status: Former    Current packs/day: 0.25    Average packs/day: 0.3 packs/day for 5.0 years (1.3 ttl pk-yrs)    Types: Cigarettes   Smokeless tobacco: Never  Vaping Use   Vaping status: Never Used  Substance Use Topics   Alcohol use: No   Drug use: No     Family Hx: The patient's family history includes Coronary artery disease (age of onset: 41) in her father; Lupus in her mother.  ROS:   Please see the history of present illness.    She All other systems reviewed and are negative.   Prior Sleep studies:   The following studies were reviewed today:  PAP compliance download  Labs/Other Tests and Data Reviewed:     Recent Labs: 09/19/2022: NT-Pro BNP 105 10/10/2022: ALT 25 10/29/2022: BUN 12; Creatinine, Ser 0.95; Potassium 4.2; Sodium 137   Wt Readings from Last 3 Encounters:  02/06/23 257 lb (116.6 kg)  09/19/22 284 lb 3.2 oz (128.9 kg)  08/21/22 278 lb 9.6 oz (126.4 kg)    EKG Interpretation Date/Time:  Friday February 06 2023 09:37:52 EST Ventricular Rate:  57 PR Interval:  170 QRS Duration:  88 QT Interval:  466 QTC Calculation: 453 R Axis:   7  Text Interpretation: Sinus bradycardia Low voltage QRS Nonspecific T wave abnormality When compared with ECG of 25-Jul-2018 13:49, PR interval has decreased Nonspecific T wave abnormality now evident in Anterior leads Confirmed by  Armanda Magic ([id number]) on 02/06/2023 9:59:41 AM   Risk Assessment/Calculations:      Objective:    Vital Signs:  BP 108/80   Pulse (!) 57   Ht 5\' 5"  (1.651 m)   Wt 257 lb (116.6 kg)   BMI 42.77 kg/m   GEN: Well nourished, well developed in no acute distress HEENT: Normal NECK: No JVD; No carotid bruits LYMPHATICS: No lymphadenopathy CARDIAC:RRR, no  murmurs, rubs, gallops RESPIRATORY:  Clear to auscultation without rales, wheezing or rhonchi  ABDOMEN: Soft, non-tender, non-distended MUSCULOSKELETAL:  No edema; No deformity  SKIN: Warm and dry NEUROLOGIC:  Alert and oriented x 3 PSYCHIATRIC:  Normal affect  ASSESSMENT & PLAN:    OSA - The patient is tolerating PAP therapy well without any problems. The PAP download performed by his DME was personally reviewed and interpreted by me today and showed an AHI of 0.8/hr on 10 cm H2O with 87% compliance in using more than 4 hours nightly.  The patient has been using and benefiting from PAP use and will continue to benefit from therapy.   HTN -BP is adequately controlled on exam today -Continue prescription drug managed with lisinopril 30 mg daily, Toprol-XL 25 mg daily with as needed refills -I have personally reviewed and interpreted outside labs performed by patient's PCP which showed serum creatinine 0.93 and potassium 3.9 on 12/09/2022  Chronic Shortness of breath  - 2D echo 11/2021 showed normal LV function and diastolic function.   - RHC showed normal right heart pressures 2020 - It is felt that her shortness of breath is more related to underlying morbid obesity as well as obstructive sleep apnea and possibly GERD.  - Lexiscan Myoview showed no ischemia 2020   Pulmonary hypertension  -this was noted in the past on echo but  right heart cath showed normal right heart pressures. -2D echo 11/2021 showed EF 65 to 70% with normal diastolic function and no pulmonary hypertension.  CAD -Coronary CTA 11/2021 showing a coronary Ca score of  4 with <25% RPDA, 25-49% mid to dLAD -She denies any anginal symptoms since I saw her last -Continue drug managed with aspirin 81 mg daily, statin therapy and Toprol XL 25 mg daily with as needed refills  HLD -LDL goal < 70 -I have personally reviewed and interpreted outside labs performed by patient's PCP which showed LDL 50 and HDL 49 on 12/09/2022 with ALT 50 -Continue prescription drug management atorvastatin 10 mg daily with as needed refills  Palpitations -2-week Zio patch 09/04/2022 showed runs of SVT lasting as long as 9 beats consistent with paroxysmal atrial tachycardia and rare PVCs and PACs -Palpitations are well-controlled on beta-blocker therapy -Continue prescription drug management with Toprol-XL 25 mg daily with as needed refills  Chronic LE edema -this is controlled on diuretics -check BMET today as she has not been taking her Kdur for over 3 months because it irritates her stomach  -continue prescription drug management with Demadex 20mg  BID with PRN refills  Medication Adjustments/Labs and Tests Ordered: Current medicines are reviewed at length with the patient today.  Concerns regarding medicines are outlined above.   Tests Ordered: Orders Placed This Encounter  Procedures   EKG 12-Lead    Medication Changes: No orders of the defined types were placed in this encounter.   Follow Up:  In Person with extedner in 4 weeks and me in 6 months  Signed, Armanda Magic, MD  02/06/2023 9:52 AM    [facility]

## 2023-02-07 LAB — BASIC METABOLIC PANEL
BUN/Creatinine Ratio: 12 (ref 12–28)
BUN: 10 mg/dL (ref 8–27)
CO2: 27 mmol/L (ref 20–29)
Calcium: 9.1 mg/dL (ref 8.7–10.3)
Chloride: 100 mmol/L (ref 96–106)
Creatinine, Ser: 0.85 mg/dL (ref 0.57–1.00)
Glucose: 92 mg/dL (ref 70–99)
Potassium: 4.2 mmol/L (ref 3.5–5.2)
Sodium: 139 mmol/L (ref 134–144)
eGFR: 77 mL/min/{1.73_m2} (ref >59)

## 2023-02-09 ENCOUNTER — Telehealth: Payer: Self-pay | Admitting: Cardiology

## 2023-02-09 NOTE — Telephone Encounter (Signed)
  Pt is returning call to get blood result

## 2023-02-11 DIAGNOSIS — I10 Essential (primary) hypertension: Secondary | ICD-10-CM | POA: Diagnosis not present

## 2023-02-11 DIAGNOSIS — M79661 Pain in right lower leg: Secondary | ICD-10-CM | POA: Diagnosis not present

## 2023-02-11 DIAGNOSIS — I272 Pulmonary hypertension, unspecified: Secondary | ICD-10-CM | POA: Diagnosis not present

## 2023-02-11 DIAGNOSIS — Z299 Encounter for prophylactic measures, unspecified: Secondary | ICD-10-CM | POA: Diagnosis not present

## 2023-02-11 DIAGNOSIS — I5032 Chronic diastolic (congestive) heart failure: Secondary | ICD-10-CM | POA: Diagnosis not present

## 2023-02-16 DIAGNOSIS — G4733 Obstructive sleep apnea (adult) (pediatric): Secondary | ICD-10-CM | POA: Diagnosis not present

## 2023-02-17 DIAGNOSIS — M79604 Pain in right leg: Secondary | ICD-10-CM | POA: Diagnosis not present

## 2023-02-17 DIAGNOSIS — M7989 Other specified soft tissue disorders: Secondary | ICD-10-CM | POA: Diagnosis not present

## 2023-02-17 NOTE — Telephone Encounter (Signed)
Call to patient to advise that BMET was normal on 02/06/23. Patient verbalizes understanding.

## 2023-02-18 DIAGNOSIS — M329 Systemic lupus erythematosus, unspecified: Secondary | ICD-10-CM | POA: Diagnosis not present

## 2023-02-18 DIAGNOSIS — I10 Essential (primary) hypertension: Secondary | ICD-10-CM | POA: Diagnosis not present

## 2023-02-18 DIAGNOSIS — Z299 Encounter for prophylactic measures, unspecified: Secondary | ICD-10-CM | POA: Diagnosis not present

## 2023-02-18 DIAGNOSIS — I5032 Chronic diastolic (congestive) heart failure: Secondary | ICD-10-CM | POA: Diagnosis not present

## 2023-02-18 DIAGNOSIS — I8390 Asymptomatic varicose veins of unspecified lower extremity: Secondary | ICD-10-CM | POA: Diagnosis not present

## 2023-02-26 DIAGNOSIS — M797 Fibromyalgia: Secondary | ICD-10-CM | POA: Diagnosis not present

## 2023-02-26 DIAGNOSIS — M25511 Pain in right shoulder: Secondary | ICD-10-CM | POA: Diagnosis not present

## 2023-02-26 DIAGNOSIS — R21 Rash and other nonspecific skin eruption: Secondary | ICD-10-CM | POA: Diagnosis not present

## 2023-02-26 DIAGNOSIS — M329 Systemic lupus erythematosus, unspecified: Secondary | ICD-10-CM | POA: Diagnosis not present

## 2023-02-26 DIAGNOSIS — M170 Bilateral primary osteoarthritis of knee: Secondary | ICD-10-CM | POA: Diagnosis not present

## 2023-02-26 DIAGNOSIS — Z7952 Long term (current) use of systemic steroids: Secondary | ICD-10-CM | POA: Diagnosis not present

## 2023-03-03 DIAGNOSIS — Z1231 Encounter for screening mammogram for malignant neoplasm of breast: Secondary | ICD-10-CM | POA: Diagnosis not present

## 2023-03-20 DIAGNOSIS — M549 Dorsalgia, unspecified: Secondary | ICD-10-CM | POA: Diagnosis not present

## 2023-03-20 DIAGNOSIS — R519 Headache, unspecified: Secondary | ICD-10-CM | POA: Diagnosis not present

## 2023-03-20 DIAGNOSIS — I5032 Chronic diastolic (congestive) heart failure: Secondary | ICD-10-CM | POA: Diagnosis not present

## 2023-03-20 DIAGNOSIS — G4700 Insomnia, unspecified: Secondary | ICD-10-CM | POA: Diagnosis not present

## 2023-03-20 DIAGNOSIS — I10 Essential (primary) hypertension: Secondary | ICD-10-CM | POA: Diagnosis not present

## 2023-03-20 DIAGNOSIS — Z299 Encounter for prophylactic measures, unspecified: Secondary | ICD-10-CM | POA: Diagnosis not present

## 2023-04-16 ENCOUNTER — Other Ambulatory Visit: Payer: Self-pay | Admitting: *Deleted

## 2023-04-16 DIAGNOSIS — M7989 Other specified soft tissue disorders: Secondary | ICD-10-CM

## 2023-04-27 NOTE — Progress Notes (Signed)
Patient name: Kylie [name] MRN: [id number] DOB: [birth year] Sex: female  REASON FOR CONSULT: Varicose veins with leg swelling  HPI: Kylie [name] is a 63 y.o. female, with history of obesity BMI 42, pulmonary hypertension, lupus, coronary artery disease that presents for evaluation of varicose veins with leg swelling.  Patient's main complaint is in the right leg.  She also has discomfort around the ankle on the medial side where she has noticed some discolored skin changes.  States this area above right ankle often tender to touch.  No previous DVT.  No prior vascular interventions.  Does have compression stockings.  Past Medical History:  Diagnosis Date   Ascending aorta dilatation (HCC)    39mm by echo 9/23   Atrial tachycardia (HCC) 09/18/2022   Monitor 08/2022: NSR, Avg HR 74; several short runs of ATach (9 beats, 141 bpm), Rare PVCs/PACs>>beta-blocker ?   CAD (coronary artery disease), native coronary artery    Coronary CTA 11/2021  showed coronary Ca score of 4.  There is <25% RPDA, 25-49% mid to dLAD   Fibromyalgia    Hearing loss    Hypertension    Obesity (BMI 30-39.9) 04/14/2014   OSA (obstructive sleep apnea) 02/07/2015   Severe with AHI 36/hr now on CPAP at 10cm H2O   Pulmonary HTN (HCC) 04/14/2014   Resolved by 2D echo 2020   Sinus tachycardia 04/14/2014   SLE (systemic lupus erythematosus) (HCC)    Tobacco use     Past Surgical History:  Procedure Laterality Date   CESAREAN SECTION     CHOLECYSTECTOMY     COLONOSCOPY N/A 07/09/2016   Procedure: COLONOSCOPY;  Surgeon: West Bali, MD;  Location: [facility];  Service: Endoscopy;  Laterality: N/A;  9:00am   RIGHT HEART CATH N/A 07/26/2018   Procedure: RIGHT HEART CATH;  Surgeon: Dolores Patty, MD;  Location: [facility] INVASIVE CV LAB;  Service: Cardiovascular;  Laterality: N/A;   RIGHT HEART CATHETERIZATION N/A 05/31/2014   Procedure: RIGHT HEART CATH;  Surgeon: Laurey Morale, MD;  Location: [facility] CATH LAB;   Service: Cardiovascular;  Laterality: N/A;    Family History  Problem Relation Age of Onset   Coronary artery disease Father 21   Lupus Mother     SOCIAL HISTORY: Social History   Socioeconomic History   Marital status: Legally Separated    Spouse name: Not on file   Number of children: 1   Years of education: 12   Highest education level: Not on file  Occupational History   Occupation: Disabled  Tobacco Use   Smoking status: Former    Current packs/day: 0.25    Average packs/day: 0.3 packs/day for 5.0 years (1.3 ttl pk-yrs)    Types: Cigarettes   Smokeless tobacco: Never  Vaping Use   Vaping status: Never Used  Substance and Sexual Activity   Alcohol use: No   Drug use: No   Sexual activity: Not on file  Other Topics Concern   Not on file  Social History Narrative   Lives alone.  Four grand children and one living child.     Social Drivers of Corporate investment banker Strain: Not on file  Food Insecurity: Not on file  Transportation Needs: Not on file  Physical Activity: Not on file  Stress: Not on file  Social Connections: Not on file  Intimate Partner Violence: Not on file    Allergies  Allergen Reactions   Ibuprofen Swelling   Amoxicillin Other (  See Comments)    Patient doesn't recall   Sulfa Antibiotics Other (See Comments)    Patient doesn't recall    Current Outpatient Medications  Medication Sig Dispense Refill   acetaminophen (TYLENOL) 500 MG tablet Take 500 mg by mouth as needed for moderate pain.      Ascorbic Acid (VITAMIN C PO) Take 1 tablet by mouth daily.     aspirin EC 81 MG tablet Take 1 tablet (81 mg total) by mouth daily. Swallow whole. 90 tablet 3   atorvastatin (LIPITOR) 10 MG tablet Take 1 tablet (10 mg total) by mouth daily. 90 tablet 3   chlorzoxazone (PARAFON) 500 MG tablet Take 500 mg by mouth 4 (four) times daily as needed for muscle spasms.     Cholecalciferol (VITAMIN D3 PO) Take 1 tablet by mouth daily.      hydroxychloroquine (PLAQUENIL) 200 MG tablet Take 400 mg by mouth daily.     lisinopril (ZESTRIL) 30 MG tablet TAKE ONE TABLET ONCE DAILY 90 tablet 3   metoprolol succinate (TOPROL-XL) 25 MG 24 hr tablet Take 1 tablet (25 mg total) by mouth daily. 90 tablet 3   mupirocin ointment (BACTROBAN) 2 % Apply topically 2 (two) times daily.     Omega-3 Fatty Acids (FISH OIL PO) Take 1 tablet by mouth daily.     omeprazole (PRILOSEC) 40 MG capsule Take 40 mg by mouth daily.     PARoxetine (PAXIL) 40 MG tablet Take 40 mg by mouth every evening.      potassium chloride SA (KLOR-CON M) 20 MEQ tablet Take 1 tablet (20 mEq total) by mouth 2 (two) times daily. (Patient not taking: Reported on 02/06/2023) 60 tablet 6   torsemide (DEMADEX) 20 MG tablet Take 1 tablet (20 mg total) by mouth 2 (two) times daily. 180 tablet 3   traMADol (ULTRAM) 50 MG tablet Take 50 mg by mouth every 6 (six) hours as needed.     traZODone (DESYREL) 100 MG tablet Take 100 mg by mouth at bedtime.     VITAMIN E PO Take 1 tablet by mouth daily.     No current facility-administered medications for this visit.    REVIEW OF SYSTEMS:  [X]  denotes positive finding, [ ]  denotes negative finding Cardiac  Comments:  Chest pain or chest pressure:    Shortness of breath upon exertion:    Short of breath when lying flat:    Irregular heart rhythm:        Vascular    Pain in calf, thigh, or hip brought on by ambulation:    Pain in feet at night that wakes you up from your sleep:     Blood clot in your veins:    Leg swelling:  x       Pulmonary    Oxygen at home:    Productive cough:     Wheezing:         Neurologic    Sudden weakness in arms or legs:     Sudden numbness in arms or legs:     Sudden onset of difficulty speaking or slurred speech:    Temporary loss of vision in one eye:     Problems with dizziness:         Gastrointestinal    Blood in stool:     Vomited blood:         Genitourinary    Burning when urinating:      Blood in urine:  Psychiatric    Major depression:         Hematologic    Bleeding problems:    Problems with blood clotting too easily:        Skin    Rashes or ulcers:        Constitutional    Fever or chills:      PHYSICAL EXAM: There were no vitals filed for this visit.  GENERAL: The patient is a well-nourished female, in no acute distress. The vital signs are documented above. CARDIAC: There is a regular rate and rhythm.  VASCULAR:  Bilateral femoral pulses palpable Bilateral DP pulses palpable Right lower extremity skin thickening above the ankle on the medial side PULMONARY: No respiratory distress. ABDOMEN: Soft and non-tender. MUSCULOSKELETAL: There are no major deformities or cyanosis. NEUROLOGIC: No focal weakness or paresthesias are detected. SKIN: There are no ulcers or rashes noted. PSYCHIATRIC: The patient has a normal affect.  DATA:    Lower Venous Reflux Study   Patient Name:  Kylie [name] Arther  Date of Exam:   04/28/2023  Medical Rec #: [id number]         Accession #:    [id number]  Date of Birth: 1960-11-05          Patient Gender: F  Patient Age:   2 years  Exam Location:  [facility]  Procedure:      VAS [place] LOWER EXTREMITY VENOUS REFLUX  Referring Phys: Sherald Hess    ---------------------------------------------------------------------------  -----    Indications: Pain, Swelling, Edema, and varicosities.    Risk Factors: Venous Insufficiency.  Performing Technologist: Criss Rosales RVT     Examination Guidelines: A complete evaluation includes B-mode imaging,  spectral  Doppler, color Doppler, and power Doppler as needed of all accessible  portions  of each vessel. Bilateral testing is considered an integral part of a  complete  examination. Limited examinations for reoccurring indications may be  performed  as noted. The reflux portion of the exam is performed with the patient in  reverse Trendelenburg.   Significant venous reflux is defined as >500 ms in the superficial venous  system, and >1 second in the deep venous system.     Venous Reflux Times  +---------------------------+---------+------+-----------+------------+----  ----+  RIGHT                     Reflux NoRefluxReflux TimeDiameter  cmsComments                                      Yes                                    +---------------------------+---------+------+-----------+------------+----  ----+  CFV                       no                                               +---------------------------+---------+------+-----------+------------+----  ----+  FV prox                    no                                               +---------------------------+---------+------+-----------+------------+----  ----+  FV mid                     no                                               +---------------------------+---------+------+-----------+------------+----  ----+  FV dist                    no                                               +---------------------------+---------+------+-----------+------------+----  ----+  Popliteal                 no                                               +---------------------------+---------+------+-----------+------------+----  ----+  GSV at [facility]                           yes    >500 ms      0.70               +---------------------------+---------+------+-----------+------------+----  ----+  GSV prox thigh                       yes    >500 ms      0.95               +---------------------------+---------+------+-----------+------------+----  ----+  GSV mid thigh                        yes    >500 ms      0.50               +---------------------------+---------+------+-----------+------------+----  ----+  GSV dist thigh                       yes    >500 ms      0.90                +---------------------------+---------+------+-----------+------------+----  ----+  GSV at knee                          yes    >500 ms      0.50               +---------------------------+---------+------+-----------+------------+----  ----+  GSV prox calf                        yes    >500 ms      0.50               +---------------------------+---------+------+-----------+------------+----  ----+  GSV mid calf                         yes    >500 ms      0.40               +---------------------------+---------+------+-----------+------------+----  ----+  SSV Pop Fossa              no                            0.30               +---------------------------+---------+------+-----------+------------+----  ----+  SSV prox calf              no                            0.20               +---------------------------+---------+------+-----------+------------+----  ----+  SSV mid calf                         yes    >500 ms      0.30               +---------------------------+---------+------+-----------+------------+----  ----+  Medial Calf Varicositis              yes    >500 ms      0.37               +---------------------------+---------+------+-----------+------------+----  ----+  Posterior Calf Varicosities          yes    >500 ms      0.40               +---------------------------+---------+------+-----------+------------+----  ----+     Summary:  Right:  - No evidence of deep vein thrombosis seen in the right lower extremity,  from the common femoral through the popliteal veins.  - No evidence of superficial venous thrombosis in the right lower  extremity.  - Venous reflux is noted in the right sapheno-femoral junction.  - Venous reflux is noted in the right greater saphenous vein in the thigh.  - Venous reflux is noted in the right greater saphenous vein in the calf  and associated varicosities.  -  Venous reflux is noted in the right short saphenous vein and associated  varicosities.    *See table(s) above for measurements and observations.   Electronically signed by Sherald Hess MD on 04/28/2023 at 1:39:42 PM.      Assessment/Plan:  63 y.o. female, with history of obesity, pulmonary hypertension, lupus, coronary artery disease that presents for evaluation of varicose veins with leg swelling.   Her main complaint is the right leg.  Her exam is consistent with CEAP classification C4 given skin changes from chronic venous insufficiency in the right leg.  I have discussed the etiology for chronic venous insufficiency with valvular reflux.  I discussed leg elevation with exercise compression and weight loss for conservative therapy.  I will have her come back in 3 months and see one of my partners that does laser ablation.  She has a large refluxing great saphenous vein that may be amendable to laser ablation.   Cephus Shelling, MD [facility] [place] Office: [phone]

## 2023-04-28 ENCOUNTER — Ambulatory Visit (HOSPITAL_COMMUNITY)
Admission: RE | Admit: 2023-04-28 | Discharge: 2023-04-28 | Disposition: A | Discharge status: Home / Self Care | Payer: 59 | Source: Ambulatory Visit | Attending: Vascular Surgery | Admitting: Vascular Surgery

## 2023-04-28 ENCOUNTER — Encounter: Payer: Self-pay | Admitting: Vascular Surgery

## 2023-04-28 ENCOUNTER — Ambulatory Visit (INDEPENDENT_AMBULATORY_CARE_PROVIDER_SITE_OTHER): Payer: 59 | Admitting: Vascular Surgery

## 2023-04-28 VITALS — BP 117/56 | HR 63 | Temp 98.0°F | Resp 22 | Ht 65.0 in | Wt 255.6 lb

## 2023-04-28 DIAGNOSIS — M7989 Other specified soft tissue disorders: Secondary | ICD-10-CM | POA: Insufficient documentation

## 2023-04-28 DIAGNOSIS — I872 Venous insufficiency (chronic) (peripheral): Secondary | ICD-10-CM | POA: Insufficient documentation

## 2023-05-19 DIAGNOSIS — G4733 Obstructive sleep apnea (adult) (pediatric): Secondary | ICD-10-CM | POA: Diagnosis not present

## 2023-06-05 DIAGNOSIS — I272 Pulmonary hypertension, unspecified: Secondary | ICD-10-CM | POA: Diagnosis not present

## 2023-06-05 DIAGNOSIS — Z20822 Contact with and (suspected) exposure to covid-19: Secondary | ICD-10-CM | POA: Diagnosis not present

## 2023-06-05 DIAGNOSIS — Z299 Encounter for prophylactic measures, unspecified: Secondary | ICD-10-CM | POA: Diagnosis not present

## 2023-06-05 DIAGNOSIS — M329 Systemic lupus erythematosus, unspecified: Secondary | ICD-10-CM | POA: Diagnosis not present

## 2023-06-05 DIAGNOSIS — D84821 Immunodeficiency due to drugs: Secondary | ICD-10-CM | POA: Diagnosis not present

## 2023-06-05 DIAGNOSIS — J441 Chronic obstructive pulmonary disease with (acute) exacerbation: Secondary | ICD-10-CM | POA: Diagnosis not present

## 2023-06-22 DIAGNOSIS — R143 Flatulence: Secondary | ICD-10-CM | POA: Diagnosis not present

## 2023-06-22 DIAGNOSIS — M549 Dorsalgia, unspecified: Secondary | ICD-10-CM | POA: Diagnosis not present

## 2023-06-22 DIAGNOSIS — I10 Essential (primary) hypertension: Secondary | ICD-10-CM | POA: Diagnosis not present

## 2023-06-22 DIAGNOSIS — Z299 Encounter for prophylactic measures, unspecified: Secondary | ICD-10-CM | POA: Diagnosis not present

## 2023-06-22 DIAGNOSIS — I5032 Chronic diastolic (congestive) heart failure: Secondary | ICD-10-CM | POA: Diagnosis not present

## 2023-07-16 DIAGNOSIS — G4733 Obstructive sleep apnea (adult) (pediatric): Secondary | ICD-10-CM | POA: Diagnosis not present

## 2023-08-03 ENCOUNTER — Ambulatory Visit: Payer: 59 | Attending: Surgery | Admitting: Surgery

## 2023-08-03 ENCOUNTER — Encounter: Payer: Self-pay | Admitting: Surgery

## 2023-08-03 VITALS — BP 113/78 | HR 54 | Temp 97.8°F | Resp 20 | Ht 65.0 in | Wt 244.6 lb

## 2023-08-03 DIAGNOSIS — I83891 Varicose veins of right lower extremities with other complications: Secondary | ICD-10-CM | POA: Diagnosis not present

## 2023-08-03 NOTE — Progress Notes (Signed)
 [facility] [place]  Patient name: Kylie [name] MRN: [id number] DOB: Dec 15, 1960 Sex: female   REASON FOR VISIT:    Follow up varicose vein  HISOTRY OF PRESENT ILLNESS:    Kylie [name] is a 63 y.o. female who is here today for follow-up of her varicose veins.  She was originally seen by Dr. Fulton Job in February 2025.  Her biggest issue is on the right.  She has noticed some skin discolorations.  She thinks that she may have had some kind of ant or spider bite.  Because of her pain and swelling, she ended up getting a venous insufficiency ultrasound that showed significant saphenous vein reflux.  She was given 20-30 thigh-high compression stockings and encouraged to keep her legs elevated.  She is back today for follow-up.  The pain in her medial calf has improved but she continues to have persistent swelling  Patient suffers from morbid obesity.  She has a history of pulmonary hypertension, lupus, and coronary artery disease.  She is medically managed for hypertension with an ACE inhibitor.  She takes a statin for hypercholesterolemia.  She is a former smoker.   PAST MEDICAL HISTORY:   Past Medical History:  Diagnosis Date   Ascending aorta dilatation (HCC)    39mm by echo 9/23   Atrial tachycardia (HCC) 09/18/2022   Monitor 08/2022: NSR, Avg HR 74; several short runs of ATach (9 beats, 141 bpm), Rare PVCs/PACs>>beta-blocker ?   CAD (coronary artery disease), native coronary artery    Coronary CTA 11/2021  showed coronary Ca score of 4.  There is <25% RPDA, 25-49% mid to dLAD   Fibromyalgia    Hearing loss    Hyperlipidemia    Hypertension    Obesity (BMI 30-39.9) 04/14/2014   OSA (obstructive sleep apnea) 02/07/2015   Severe with AHI 36/hr now on CPAP at 10cm H2O   Pulmonary HTN (HCC) 04/14/2014   Resolved by 2D echo 2020   Sinus tachycardia 04/14/2014   SLE (systemic lupus erythematosus) (HCC)    Tobacco use       FAMILY HISTORY:   Family History  Problem Relation Age of Onset   Coronary artery disease Father 62   Lupus Mother     SOCIAL HISTORY:   Social History   Tobacco Use   Smoking status: Former    Current packs/day: 0.25    Average packs/day: 0.3 packs/day for 5.0 years (1.3 ttl pk-yrs)    Types: Cigarettes   Smokeless tobacco: Never  Substance Use Topics   Alcohol use: No     ALLERGIES:   Allergies  Allergen Reactions   Ibuprofen Swelling   Amoxicillin Other (See Comments)    Patient doesn't recall   Sulfa Antibiotics Other (See Comments)    Patient doesn't recall     CURRENT MEDICATIONS:   Current Outpatient Medications  Medication Sig Dispense Refill   acetaminophen  (TYLENOL ) 500 MG tablet Take 500 mg by mouth as needed for moderate pain.      Ascorbic Acid (VITAMIN C PO) Take 1 tablet by mouth daily.     aspirin  EC 81 MG tablet Take 1 tablet (81 mg total) by mouth daily. Swallow whole. 90 tablet 3   chlorzoxazone (PARAFON) 500 MG tablet Take 500 mg by mouth 4 (four) times daily as needed for muscle spasms.     Cholecalciferol  (VITAMIN D3 PO) Take 1 tablet by mouth daily.     hydroxychloroquine  (PLAQUENIL ) 200 MG tablet Take 400 mg  by mouth daily.     lisinopril  (ZESTRIL ) 30 MG tablet TAKE ONE TABLET ONCE DAILY 90 tablet 3   metoprolol  succinate (TOPROL -XL) 25 MG 24 hr tablet Take 1 tablet (25 mg total) by mouth daily. 90 tablet 3   Omega-3 Fatty Acids (FISH OIL PO) Take 1 tablet by mouth daily.     omeprazole (PRILOSEC) 40 MG capsule Take 40 mg by mouth daily.     PARoxetine  (PAXIL ) 40 MG tablet Take 40 mg by mouth every evening.      potassium chloride  SA (KLOR-CON  M) 20 MEQ tablet Take 1 tablet (20 mEq total) by mouth 2 (two) times daily. 60 tablet 6   torsemide  (DEMADEX ) 20 MG tablet Take 1 tablet (20 mg total) by mouth 2 (two) times daily. 180 tablet 3   traMADol (ULTRAM) 50 MG tablet Take 50 mg by mouth every 6 (six) hours as needed.     traZODone   (DESYREL ) 100 MG tablet Take 100 mg by mouth at bedtime.     VITAMIN E PO Take 1 tablet by mouth daily.     atorvastatin  (LIPITOR) 10 MG tablet Take 1 tablet (10 mg total) by mouth daily. 90 tablet 3   mupirocin ointment (BACTROBAN) 2 % Apply topically 2 (two) times daily. (Patient not taking: Reported on 08/03/2023)     No current facility-administered medications for this visit.    REVIEW OF SYSTEMS:   [X]  denotes positive finding, [ ]  denotes negative finding Cardiac  Comments:  Chest pain or chest pressure:    Shortness of breath upon exertion:    Short of breath when lying flat:    Irregular heart rhythm:        Vascular    Pain in calf, thigh, or hip brought on by ambulation:    Pain in feet at night that wakes you up from your sleep:     Blood clot in your veins:    Leg swelling:  xx       Pulmonary    Oxygen at home:    Productive cough:     Wheezing:         Neurologic    Sudden weakness in arms or legs:     Sudden numbness in arms or legs:     Sudden onset of difficulty speaking or slurred speech:    Temporary loss of vision in one eye:     Problems with dizziness:         Gastrointestinal    Blood in stool:     Vomited blood:         Genitourinary    Burning when urinating:     Blood in urine:        Psychiatric    Major depression:         Hematologic    Bleeding problems:    Problems with blood clotting too easily:        Skin    Rashes or ulcers:        Constitutional    Fever or chills:      PHYSICAL EXAM:   Vitals:   08/03/23 1119  BP: 113/78  Pulse: (!) 54  Resp: 20  Temp: 97.8 F (36.6 C)  TempSrc: Temporal  SpO2: 97%  Weight: 244 lb 9.6 oz (110.9 kg)  Height: 5\' 5"  (1.651 m)    GENERAL: The patient is a well-nourished female, in no acute distress. The vital signs are documented above. CARDIAC: There is a regular rate  and rhythm.  VASCULAR: SonoSite was used to evaluate the right great saphenous vein which is markedly dilated  from the knee up to the saphenofemoral junction.  It is straight with numerous branches.  She also has varicosity on the medial calf PULMONARY: Non-labored respirations MUSCULOSKELETAL: There are no major deformities or cyanosis. NEUROLOGIC: No focal weakness or paresthesias are detected. SKIN: There are no ulcers or rashes noted. PSYCHIATRIC: The patient has a normal affect.     STUDIES:   I have reviewed the following: RIGHT                      Reflux NoRefluxReflux TimeDiameter  cmsComments                                      Yes                                    +---------------------------+---------+------+-----------+------------+----  ----+  CFV                       no                                               +---------------------------+---------+------+-----------+------------+----  ----+  FV prox                    no                                               +---------------------------+---------+------+-----------+------------+----  ----+  FV mid                     no                                               +---------------------------+---------+------+-----------+------------+----  ----+  FV dist                    no                                               +---------------------------+---------+------+-----------+------------+----  ----+  Popliteal                 no                                               +---------------------------+---------+------+-----------+------------+----  ----+  GSV at [facility]                           yes    >500 ms      0.70               +---------------------------+---------+------+-----------+------------+----  ----+  GSV prox thigh                       yes    >500 ms      0.95               +---------------------------+---------+------+-----------+------------+----  ----+  GSV mid thigh                        yes    >500 ms       0.50               +---------------------------+---------+------+-----------+------------+----  ----+  GSV dist thigh                       yes    >500 ms      0.90               +---------------------------+---------+------+-----------+------------+----  ----+  GSV at knee                          yes    >500 ms      0.50               +---------------------------+---------+------+-----------+------------+----  ----+  GSV prox calf                        yes    >500 ms      0.50               +---------------------------+---------+------+-----------+------------+----  ----+  GSV mid calf                         yes    >500 ms      0.40               +---------------------------+---------+------+-----------+------------+----  ----+  SSV Pop Fossa              no                            0.30               +---------------------------+---------+------+-----------+------------+----  ----+  SSV prox calf              no                            0.20               +---------------------------+---------+------+-----------+------------+----  ----+  SSV mid calf                         yes    >500 ms      0.30               +---------------------------+---------+------+-----------+------------+----  ----+  Medial Calf Varicositis              yes    >500 ms      0.37               +---------------------------+---------+------+-----------+------------+----  ----+  Posterior Calf Varicosities          yes    >500 ms      0.40               +---------------------------+---------+------+-----------+------------+----  ----+  MEDICAL ISSUES:   CEAP class IV, right leg: The patient has tried conservative measures of leg elevation, compression and activity increase.  She continues to have pain and swelling in her right leg.  She has significant axial reflux in the great saphenous vein which I evaluated with the SonoSite  and it is markedly dilated.  There is also area on her calf with a large varix.  I discussed that I think she would benefit from laser ablation of the saphenous vein to help with her swelling.  I would also plan on removing the varicosity in her calf.  I told her that I could not guarantee this would alleviate her right calf pain as this may be residual effects of a spider bite.  She understands all this and wishes to proceed.    Marti Slates, MD, FACS [facility] [phone] Pager [phone]

## 2023-08-15 ENCOUNTER — Other Ambulatory Visit: Payer: Self-pay | Admitting: Cardiology

## 2023-08-15 DIAGNOSIS — I10 Essential (primary) hypertension: Secondary | ICD-10-CM

## 2023-08-17 DIAGNOSIS — G4733 Obstructive sleep apnea (adult) (pediatric): Secondary | ICD-10-CM | POA: Diagnosis not present

## 2023-08-19 DIAGNOSIS — G4733 Obstructive sleep apnea (adult) (pediatric): Secondary | ICD-10-CM | POA: Diagnosis not present

## 2023-08-27 DIAGNOSIS — M329 Systemic lupus erythematosus, unspecified: Secondary | ICD-10-CM | POA: Diagnosis not present

## 2023-08-27 DIAGNOSIS — M170 Bilateral primary osteoarthritis of knee: Secondary | ICD-10-CM | POA: Diagnosis not present

## 2023-08-27 DIAGNOSIS — M25511 Pain in right shoulder: Secondary | ICD-10-CM | POA: Diagnosis not present

## 2023-08-27 DIAGNOSIS — M5136 Other intervertebral disc degeneration, lumbar region with discogenic back pain only: Secondary | ICD-10-CM | POA: Diagnosis not present

## 2023-08-27 DIAGNOSIS — R21 Rash and other nonspecific skin eruption: Secondary | ICD-10-CM | POA: Diagnosis not present

## 2023-08-27 DIAGNOSIS — M797 Fibromyalgia: Secondary | ICD-10-CM | POA: Diagnosis not present

## 2023-08-27 DIAGNOSIS — Z7952 Long term (current) use of systemic steroids: Secondary | ICD-10-CM | POA: Diagnosis not present

## 2023-09-15 ENCOUNTER — Other Ambulatory Visit: Payer: Self-pay | Admitting: *Deleted

## 2023-09-15 DIAGNOSIS — I83891 Varicose veins of right lower extremities with other complications: Secondary | ICD-10-CM

## 2023-09-25 DIAGNOSIS — I5032 Chronic diastolic (congestive) heart failure: Secondary | ICD-10-CM | POA: Diagnosis not present

## 2023-09-25 DIAGNOSIS — I10 Essential (primary) hypertension: Secondary | ICD-10-CM | POA: Diagnosis not present

## 2023-09-25 DIAGNOSIS — Z299 Encounter for prophylactic measures, unspecified: Secondary | ICD-10-CM | POA: Diagnosis not present

## 2023-10-16 DIAGNOSIS — Z299 Encounter for prophylactic measures, unspecified: Secondary | ICD-10-CM | POA: Diagnosis not present

## 2023-10-16 DIAGNOSIS — Z713 Dietary counseling and surveillance: Secondary | ICD-10-CM | POA: Diagnosis not present

## 2023-10-16 DIAGNOSIS — R509 Fever, unspecified: Secondary | ICD-10-CM | POA: Diagnosis not present

## 2023-10-16 DIAGNOSIS — R6883 Chills (without fever): Secondary | ICD-10-CM | POA: Diagnosis not present

## 2023-10-16 DIAGNOSIS — G4733 Obstructive sleep apnea (adult) (pediatric): Secondary | ICD-10-CM | POA: Diagnosis not present

## 2023-10-16 DIAGNOSIS — I5032 Chronic diastolic (congestive) heart failure: Secondary | ICD-10-CM | POA: Diagnosis not present

## 2023-10-27 DIAGNOSIS — I5032 Chronic diastolic (congestive) heart failure: Secondary | ICD-10-CM | POA: Diagnosis not present

## 2023-10-27 DIAGNOSIS — I10 Essential (primary) hypertension: Secondary | ICD-10-CM | POA: Diagnosis not present

## 2023-10-27 DIAGNOSIS — Z299 Encounter for prophylactic measures, unspecified: Secondary | ICD-10-CM | POA: Diagnosis not present

## 2023-11-02 ENCOUNTER — Other Ambulatory Visit: Payer: Self-pay | Admitting: Cardiology

## 2023-11-02 DIAGNOSIS — I272 Pulmonary hypertension, unspecified: Secondary | ICD-10-CM

## 2023-11-02 DIAGNOSIS — I10 Essential (primary) hypertension: Secondary | ICD-10-CM

## 2023-11-12 ENCOUNTER — Other Ambulatory Visit: Payer: Self-pay | Admitting: Cardiology

## 2023-11-12 DIAGNOSIS — I10 Essential (primary) hypertension: Secondary | ICD-10-CM

## 2023-11-12 DIAGNOSIS — I272 Pulmonary hypertension, unspecified: Secondary | ICD-10-CM

## 2023-11-17 DIAGNOSIS — G4733 Obstructive sleep apnea (adult) (pediatric): Secondary | ICD-10-CM | POA: Diagnosis not present

## 2023-11-25 DIAGNOSIS — Z299 Encounter for prophylactic measures, unspecified: Secondary | ICD-10-CM | POA: Diagnosis not present

## 2023-11-25 DIAGNOSIS — I5032 Chronic diastolic (congestive) heart failure: Secondary | ICD-10-CM | POA: Diagnosis not present

## 2023-11-25 DIAGNOSIS — I10 Essential (primary) hypertension: Secondary | ICD-10-CM | POA: Diagnosis not present

## 2023-12-10 DIAGNOSIS — R0981 Nasal congestion: Secondary | ICD-10-CM | POA: Diagnosis not present

## 2023-12-10 DIAGNOSIS — J3489 Other specified disorders of nose and nasal sinuses: Secondary | ICD-10-CM | POA: Diagnosis not present

## 2023-12-10 DIAGNOSIS — J329 Chronic sinusitis, unspecified: Secondary | ICD-10-CM | POA: Diagnosis not present

## 2023-12-10 DIAGNOSIS — Z299 Encounter for prophylactic measures, unspecified: Secondary | ICD-10-CM | POA: Diagnosis not present

## 2023-12-10 DIAGNOSIS — M329 Systemic lupus erythematosus, unspecified: Secondary | ICD-10-CM | POA: Diagnosis not present

## 2023-12-10 DIAGNOSIS — J449 Chronic obstructive pulmonary disease, unspecified: Secondary | ICD-10-CM | POA: Diagnosis not present

## 2023-12-30 ENCOUNTER — Other Ambulatory Visit: Payer: Self-pay | Admitting: *Deleted

## 2023-12-30 DIAGNOSIS — Z7189 Other specified counseling: Secondary | ICD-10-CM | POA: Diagnosis not present

## 2023-12-30 DIAGNOSIS — Z79899 Other long term (current) drug therapy: Secondary | ICD-10-CM | POA: Diagnosis not present

## 2023-12-30 DIAGNOSIS — I5032 Chronic diastolic (congestive) heart failure: Secondary | ICD-10-CM | POA: Diagnosis not present

## 2023-12-30 DIAGNOSIS — E7800 Pure hypercholesterolemia, unspecified: Secondary | ICD-10-CM | POA: Diagnosis not present

## 2023-12-30 DIAGNOSIS — Z299 Encounter for prophylactic measures, unspecified: Secondary | ICD-10-CM | POA: Diagnosis not present

## 2023-12-30 DIAGNOSIS — I10 Essential (primary) hypertension: Secondary | ICD-10-CM | POA: Diagnosis not present

## 2023-12-30 DIAGNOSIS — R5383 Other fatigue: Secondary | ICD-10-CM | POA: Diagnosis not present

## 2023-12-30 DIAGNOSIS — Z0000 Encounter for general adult medical examination without abnormal findings: Secondary | ICD-10-CM | POA: Diagnosis not present

## 2023-12-30 MED ORDER — LORAZEPAM 1 MG PO TABS
ORAL_TABLET | ORAL | 0 refills | Status: DC
Start: 2023-12-30 — End: 2024-01-21

## 2024-01-07 ENCOUNTER — Ambulatory Visit: Attending: Surgery | Admitting: Surgery

## 2024-01-07 VITALS — BP 96/63 | HR 79 | Temp 97.7°F | Ht 66.0 in | Wt 233.0 lb

## 2024-01-07 DIAGNOSIS — I83891 Varicose veins of right lower extremities with other complications: Secondary | ICD-10-CM

## 2024-01-07 NOTE — Progress Notes (Signed)
     Laser Ablation Procedure    Date: 01/07/2024 Kylie [name] DOB:1960/04/29 Consent signed: Yes     Surgeon: Dr. Gaile New Procedure: Laser Ablation: right Greater Saphenous Vein  BP 96/63 (BP Location: Right Arm, Patient Position: Sitting, Cuff Size: Normal)   Pulse 79   Temp 97.7 F (36.5 C) (Temporal)   Ht 5' 6 (1.676 m)   Wt 233 lb (105.7 kg)   SpO2 98%   BMI 37.61 kg/m  Tumescent Anesthesia: 400 cc 0.9% NaCl with 50 cc Lidocaine  HCL 1%  and 15 cc 8.4% NaHCO3 Local Anesthesia: 5 cc Lidocaine  HCL and NaHCO3 (ratio 2:1) 7 watts continuous mode    Total energy: 1518.5 joules    Total time: 216 seconds Treatment Length 30 cm Laser Fiber Ref. # [id number]        Lot # [id number] Stab Phlebectomy: 10-20 Sites: Calf  Patient tolerated procedure well  Notes:  All staff members wore facial masks and facial shields/goggles. Pt had 1 mg of ativan at 09:00 am and another 1 mg at 10:35 am. Making a total of 2 mg of ativan prior to the surgical procedure   Description of Procedure:  After marking the course of the secondary varicosities, the patient was placed on the operating table in the supine position, and the right leg was prepped and draped in sterile fashion.   Local anesthetic was administered and under ultrasound guidance the saphenous vein was accessed with a micro needle and guide wire; then the mirco puncture sheath was placed.  A guide wire was inserted saphenofemoral junction , followed by a 5 french sheath.  The position of the sheath and then the laser fiber below the junction was confirmed using the ultrasound.  Tumescent anesthesia was administered along the course of the saphenous vein using ultrasound guidance. The patient was placed in Trendelenburg position and protective laser glasses were placed on patient and staff, and the laser was fired at 7 watts continuous mode for a total of 1518.5 joules.  For stab phlebectomies, local anesthetic was administered at the  previously marked varicosities, and tumescent anesthesia was administered around the vessels.  Ten to 20 stab wounds were made using the tip of an 11 blade. And using the vein hook, the phlebectomies were performed using a hemostat to avulse the varicosities.  Adequate hemostasis was achieved.   Steri strips were applied to the stab wounds and ABD pads and thigh high compression stockings were applied.  Ace wrap bandages were applied over the phlebectomy sites and at the top of the saphenofemoral junction. Blood loss was less than 15 cc.  Discharge instructions reviewed with patient and hardcopy of discharge instructions given to patient to take home. The patient was wheeled out of the operating room having tolerated the procedure well.

## 2024-01-17 DIAGNOSIS — G4733 Obstructive sleep apnea (adult) (pediatric): Secondary | ICD-10-CM | POA: Diagnosis not present

## 2024-01-19 ENCOUNTER — Telehealth: Payer: Self-pay | Admitting: *Deleted

## 2024-01-19 NOTE — Telephone Encounter (Signed)
 Spoke with emergency contact Kylie [name].  Was unable to speak to Kylie [name] but left voicemail on her home and mobile phones. Instructed Kylie [name] that Kylie [name]'s post laser ablation duplex and follow up appointments are for Nov.6 at 10:00 AM and 10:20 AM.  (With Dr. Serene).  [name] states she will let Kylie [name] know as well as a friend that will be her transportation.

## 2024-01-21 ENCOUNTER — Ambulatory Visit (INDEPENDENT_AMBULATORY_CARE_PROVIDER_SITE_OTHER): Admitting: Surgery

## 2024-01-21 ENCOUNTER — Encounter (HOSPITAL_COMMUNITY)

## 2024-01-21 ENCOUNTER — Encounter: Admitting: Surgery

## 2024-01-21 ENCOUNTER — Encounter: Payer: Self-pay | Admitting: Surgery

## 2024-01-21 ENCOUNTER — Ambulatory Visit (HOSPITAL_COMMUNITY)
Admission: RE | Admit: 2024-01-21 | Discharge: 2024-01-21 | Disposition: A | Discharge status: Home / Self Care | Source: Ambulatory Visit | Attending: Surgery | Admitting: Surgery

## 2024-01-21 VITALS — BP 96/62 | HR 68 | Temp 98.0°F | Ht 66.0 in | Wt 234.0 lb

## 2024-01-21 DIAGNOSIS — I83891 Varicose veins of right lower extremities with other complications: Secondary | ICD-10-CM | POA: Insufficient documentation

## 2024-01-21 NOTE — Progress Notes (Signed)
 Patient name: Kylie [name] MRN: [id number] DOB: 1960/09/21 Sex: female  REASON FOR VISIT:     Post op  HISTORY OF PRESENT ILLNESS:   Kylie [name] is a 63 y.o. female who is status post endovenous laser ablation and 10-20 stabs for class IV venous disease on the right leg on 01/07/2024.  She has some appropriate soreness but otherwise no complaints  CURRENT MEDICATIONS:    Current Outpatient Medications  Medication Sig Dispense Refill   acetaminophen  (TYLENOL ) 500 MG tablet Take 500 mg by mouth as needed for moderate pain.      Ascorbic Acid (VITAMIN C PO) Take 1 tablet by mouth daily.     aspirin  EC 81 MG tablet Take 1 tablet (81 mg total) by mouth daily. Swallow whole. 90 tablet 3   atorvastatin  (LIPITOR) 10 MG tablet TAKE ONE TABLET BY MOUTH DAILY 90 tablet 3   chlorzoxazone (PARAFON) 500 MG tablet Take 500 mg by mouth 4 (four) times daily as needed for muscle spasms.     Cholecalciferol  (VITAMIN D3 PO) Take 1 tablet by mouth daily.     hydroxychloroquine  (PLAQUENIL ) 200 MG tablet Take 400 mg by mouth daily.     lisinopril  (ZESTRIL ) 30 MG tablet TAKE ONE TABLET ONCE DAILY 90 tablet 0   metoprolol  succinate (TOPROL -XL) 25 MG 24 hr tablet TAKE ONE TABLET DAILY 90 tablet 0   mupirocin ointment (BACTROBAN) 2 % Apply topically 2 (two) times daily.     Omega-3 Fatty Acids (FISH OIL PO) Take 1 tablet by mouth daily.     omeprazole (PRILOSEC) 40 MG capsule Take 40 mg by mouth daily.     PARoxetine  (PAXIL ) 40 MG tablet Take 40 mg by mouth every evening.      potassium chloride  SA (KLOR-CON  M) 20 MEQ tablet Take 1 tablet (20 mEq total) by mouth 2 (two) times daily. 60 tablet 6   torsemide  (DEMADEX ) 20 MG tablet TAKE 1 TABLET 2 TIMES A DAY 180 tablet 3   traMADol (ULTRAM) 50 MG tablet Take 50 mg by mouth every 6 (six) hours as needed.     traZODone  (DESYREL ) 100 MG tablet Take 100 mg by mouth at bedtime.     VITAMIN E PO Take 1 tablet by mouth daily.      No current facility-administered medications for this visit.    REVIEW OF SYSTEMS:   [X]  denotes positive finding, [ ]  denotes negative finding Cardiac  Comments:  Chest pain or chest pressure:    Shortness of breath upon exertion:    Short of breath when lying flat:    Irregular heart rhythm:    Constitutional    Fever or chills:      PHYSICAL EXAM:   Vitals:   01/21/24 0958  BP: 96/62  Pulse: 68  Temp: 98 F (36.7 C)  SpO2: 96%  Weight: 234 lb (106.1 kg)  Height: 5' 6 (1.676 m)    GENERAL: The patient is a well-nourished female, in no acute distress. The vital signs are documented above. CARDIOVASCULAR: There is a regular rate and rhythm. PULMONARY: Non-labored respirations All incisions have healed nicely  STUDIES:   Post ablation u/s:  Right:  - No evidence of deep vein thrombosis from the common femoral through the  popliteal veins.  - Successful ablation of the right great saphenous vein from the distal  thigh up to approximately 1.91 cm from the SFJ.   MEDICAL ISSUES:   Status post laser ablation and stab phlebectomy  of the right saphenous vein and its tributaries.  She is doing very well at this time.  Her reflux ablation study shows successful ablation up to 1.9 cm from the saphenofemoral junction.  She is doing very well at this time.  She will follow-up as needed.  Malvina Serene CLORE, MD, FACS [facility] [phone] Pager [phone]

## 2024-01-28 ENCOUNTER — Telehealth: Payer: Self-pay

## 2024-02-02 ENCOUNTER — Other Ambulatory Visit: Payer: Self-pay | Admitting: Cardiology

## 2024-02-02 DIAGNOSIS — I10 Essential (primary) hypertension: Secondary | ICD-10-CM

## 2024-02-02 DIAGNOSIS — I272 Pulmonary hypertension, unspecified: Secondary | ICD-10-CM

## 2024-02-02 NOTE — Progress Notes (Unsigned)
 Date:  02/02/2024   ID:  Kylie [name], DOB 08-Aug-1960, MRN [id number] The patient was identified using 2 identifiers.  PCP:  Maree Isles, MD   [facility] Providers Cardiologist:  Wilbert Bihari, MD     Evaluation Performed:  Follow-Up Visit  Chief Complaint:  OSA, CAD, HTN, PHTN, SOB  History of Present Illness:    Kylie [name] is a 63 y.o. female with  a hx of Chronic diastolic CHF, pulmonary HTN, obesity lupus, OSA on PAP who has been having problems with DOE and LE edema over the past few months.  It did not respond to diuretics and she underwent 2D echo showing normal LVF with normal BNP.  It was felt that her SOB and LE edema were related to obesity and OSA.  Heart cath showed normal right heart pressures.  Her diuretics were stopped.  Lexiscan  myoview  showed no ischemia.  Later due to CP a coronary CTA was done 11/2021 showing a coronary Ca score of 4.  There is <25% RPDA, 25-49% mid to dLAD.  2D echo was also normal.  She had a Zio patch done for palpitations which showed nonsustained atrial tachycardia up to 9 beats in a row and rare PACs and PVCs.  She is here today for followup and is doing well.  She denies any CP or pressure, PND, orthopnea, dizziness, palpitations or syncope.  She has chronic DOE and LE edema which are controlled on diuretics.   She is doing well with her PAP device and thinks that she has gotten used to it.  SHe tolerates the mask and feels the pressure is adequate.  Since going on PAP she feels rested in the am and has no significant daytime sleepiness.  She denies any significant mouth or nasal dryness or nasal congestion.  SHe does not think that he snores. An Epworth Sleepiness Scale score was calculated the office today and this endorsed at ### arguing against residual daytime sleepiness. Patient denies any episodes of bruxism, restless legs, No gagging hallucinations or cataplectic events.   Past Medical History:  Diagnosis Date    Ascending aorta dilatation    39mm by echo 9/23   Atrial tachycardia 09/18/2022   Monitor 08/2022: NSR, Avg HR 74; several short runs of ATach (9 beats, 141 bpm), Rare PVCs/PACs>>beta-blocker ?   CAD (coronary artery disease), native coronary artery    Coronary CTA 11/2021  showed coronary Ca score of 4.  There is <25% RPDA, 25-49% mid to dLAD   Fibromyalgia    Hearing loss    Hyperlipidemia    Hypertension    Obesity (BMI 30-39.9) 04/14/2014   OSA (obstructive sleep apnea) 02/07/2015   Severe with AHI 36/hr now on CPAP at 10cm H2O   Pulmonary HTN (HCC) 04/14/2014   Resolved by 2D echo 2020   Sinus tachycardia 04/14/2014   SLE (systemic lupus erythematosus) (HCC)    Tobacco use    Past Surgical History:  Procedure Laterality Date   CESAREAN SECTION     CHOLECYSTECTOMY     COLONOSCOPY N/A 07/09/2016   Procedure: COLONOSCOPY;  Surgeon: Margo LITTIE Haddock, MD;  Location: [facility];  Service: Endoscopy;  Laterality: N/A;  9:00am   RIGHT HEART CATH N/A 07/26/2018   Procedure: RIGHT HEART CATH;  Surgeon: Cherrie Toribio SAUNDERS, MD;  Location: [facility] INVASIVE CV LAB;  Service: Cardiovascular;  Laterality: N/A;   RIGHT HEART CATHETERIZATION N/A 05/31/2014   Procedure: RIGHT HEART CATH;  Surgeon:  Ezra GORMAN Shuck, MD;  Location: [facility] CATH LAB;  Service: Cardiovascular;  Laterality: N/A;     No outpatient medications have been marked as taking for the 02/03/24 encounter (Appointment) with Shlomo Wilbert SAUNDERS, MD.     Allergies:   Ibuprofen, Amoxicillin, and Sulfa antibiotics   Social History   Tobacco Use   Smoking status: Former    Current packs/day: 0.25    Average packs/day: 0.3 packs/day for 5.0 years (1.3 ttl pk-yrs)    Types: Cigarettes   Smokeless tobacco: Never  Vaping Use   Vaping status: Never Used  Substance Use Topics   Alcohol use: No   Drug use: No     Family Hx: The patient's family history includes Coronary artery disease (age of onset: 71) in her father; Lupus in her  mother.  ROS:   Please see the history of present illness.    She All other systems reviewed and are negative.   Prior Sleep studies:   The following studies were reviewed today:  PAP compliance download  Labs/Other Tests and Data Reviewed:     Recent Labs: 02/06/2023: BUN 10; Creatinine, Ser 0.85; Potassium 4.2; Sodium 139   Wt Readings from Last 3 Encounters:  01/21/24 234 lb (106.1 kg)  01/07/24 233 lb (105.7 kg)  08/03/23 244 lb 9.6 oz (110.9 kg)        Risk Assessment/Calculations:      Objective:    Vital Signs:  There were no vitals taken for this visit.  GEN: Well nourished, well developed in no acute distress HEENT: Normal NECK: No JVD; No carotid bruits LYMPHATICS: No lymphadenopathy CARDIAC:RRR, no murmurs, rubs, gallops RESPIRATORY:  Clear to auscultation without rales, wheezing or rhonchi  ABDOMEN: Soft, non-tender, non-distended MUSCULOSKELETAL:  No edema; No deformity  SKIN: Warm and dry NEUROLOGIC:  Alert and oriented x 3 PSYCHIATRIC:  Normal affect  ASSESSMENT & PLAN:    OSA - The patient is tolerating PAP therapy well without any problems. The PAP download performed by his DME was personally reviewed and interpreted by me today and showed an AHI of 0.9/hr on 10 cm H2O with 77% compliance in using more than 4 hours nightly.  The patient has been using and benefiting from PAP use and will continue to benefit from therapy.   HTN -BP controlled -Continue Lisinopril  30mg  daily, Toprol  XL 25mg  daily with PRN refills -I have personally reviewed and interpreted outside labs performed by patient's PCP which showed ***  Chronic Shortness of breath  - 2D echo 11/2021 showed normal LV function and diastolic function.   - RHC showed normal right heart pressures 2020 - It is felt that her shortness of breath is more related to underlying morbid obesity as well as obstructive sleep apnea and possibly GERD.  - Lexiscan  Myoview  showed no ischemia 2020    Pulmonary hypertension  -this was noted in the past on echo but  right heart cath showed normal right heart pressures. -2D echo 11/2021 showed EF 65 to 70% with normal diastolic function and no pulmonary hypertension. -repeat 2D echo  CAD -Coronary CTA 11/2021 showing a coronary Ca score of 4 with <25% RPDA, 25-49% mid to dLAD [facility] has not had any angina since I saw her last. -Continue ASA 81mg  daily, statin and Toprol  XL 25mg  daily  HLD -LDL goal < 70 -I have personally reviewed and interpreted outside labs performed by patient's PCP which showed *** -Continue Atorvastatin  10mg  daily with PRN refills  Palpitations/Atrial tach -2-week Zio  patch 09/04/2022 showed runs of SVT lasting as long as 9 beats consistent with paroxysmal atrial tachycardia and rare PVCs and PACs -denies any recent palpitations -continue Toprol  XL 25mg  daily with PRN refills  Chronic LE edema -this is controlled on diuretics -Continue Demadex  20mg  BID with PRN refills  Medication Adjustments/Labs and Tests Ordered: Current medicines are reviewed at length with the patient today.  Concerns regarding medicines are outlined above.   Tests Ordered: No orders of the defined types were placed in this encounter.   Medication Changes: No orders of the defined types were placed in this encounter.   Follow Up:  In Person with extedner in 4 weeks and me in 6 months  Signed, Wilbert Bihari, MD  02/02/2024 7:08 PM    [facility]

## 2024-02-03 ENCOUNTER — Ambulatory Visit: Attending: Cardiology | Admitting: Cardiology

## 2024-02-03 ENCOUNTER — Encounter: Payer: Self-pay | Admitting: Cardiology

## 2024-02-03 VITALS — BP 100/68 | HR 75 | Ht 65.0 in | Wt 231.0 lb

## 2024-02-03 DIAGNOSIS — G4733 Obstructive sleep apnea (adult) (pediatric): Secondary | ICD-10-CM | POA: Diagnosis not present

## 2024-02-03 DIAGNOSIS — I2510 Atherosclerotic heart disease of native coronary artery without angina pectoris: Secondary | ICD-10-CM

## 2024-02-03 DIAGNOSIS — I272 Pulmonary hypertension, unspecified: Secondary | ICD-10-CM

## 2024-02-03 DIAGNOSIS — E785 Hyperlipidemia, unspecified: Secondary | ICD-10-CM

## 2024-02-03 DIAGNOSIS — M7989 Other specified soft tissue disorders: Secondary | ICD-10-CM

## 2024-02-03 DIAGNOSIS — R0602 Shortness of breath: Secondary | ICD-10-CM

## 2024-02-03 DIAGNOSIS — I10 Essential (primary) hypertension: Secondary | ICD-10-CM | POA: Diagnosis not present

## 2024-02-03 DIAGNOSIS — I4719 Other supraventricular tachycardia: Secondary | ICD-10-CM

## 2024-02-03 NOTE — Addendum Note (Signed)
 Addended by: MANDA BOTTCHER B on: 02/03/2024 09:56 AM   Modules accepted: Orders

## 2024-02-03 NOTE — Patient Instructions (Signed)
  Lab Work: Fasting lipid panel ALT  If you have labs (blood work) drawn today and your tests are completely normal, you will receive your results only by: MyChart Message (if you have MyChart) OR A paper copy in the mail If you have any lab test that is abnormal or we need to change your treatment, we will call you to review the results.  Testing/Procedures: ECHOCARDIOGRAM  Your physician has requested that you have an echocardiogram. Echocardiography is a painless test that uses sound waves to create images of your heart. It provides your doctor with information about the size and shape of your heart and how well your heart's chambers and valves are working. This procedure takes approximately one hour. There are no restrictions for this procedure. Please do NOT wear cologne, perfume, aftershave, or lotions (deodorant is allowed). Please arrive 15 minutes prior to your appointment time.  Please note: We ask at that you not bring children with you during ultrasound (echo/ vascular) testing. Due to room size and safety concerns, children are not allowed in the ultrasound rooms during exams. Our front office staff cannot provide observation of children in our lobby area while testing is being conducted. An adult accompanying a patient to their appointment will only be allowed in the ultrasound room at the discretion of the ultrasound technician under special circumstances. We apologize for any inconvenience.   Follow-Up: At [facility], you and your health needs are our priority.  As part of our continuing mission to provide you with exceptional heart care, our providers are all part of one team.  This team includes your primary Cardiologist (physician) and Advanced Practice Providers or APPs (Physician Assistants and Nurse Practitioners) who all work together to provide you with the care you need, when you need it.  Your next appointment:   1 year(s)  Provider:   Wilbert Bihari, MD

## 2024-02-04 ENCOUNTER — Ambulatory Visit: Payer: Self-pay | Admitting: Cardiology

## 2024-02-04 LAB — ALT: ALT: 19 IU/L (ref 0–32)

## 2024-02-04 LAB — LIPID PANEL
Chol/HDL Ratio: 2.5 ratio (ref 0.0–4.4)
Cholesterol, Total: 109 mg/dL (ref 100–199)
HDL: 43 mg/dL (ref >39)
LDL Chol Calc (NIH): 44 mg/dL (ref 0–99)
Triglycerides: 125 mg/dL (ref 0–149)
VLDL Cholesterol Cal: 22 mg/dL (ref 5–40)

## 2024-02-09 NOTE — Telephone Encounter (Signed)
 Call to patient to discuss results. No answer, left detailed message explaining labs were normal. Advised patient to continue medications and call our office if any questions.

## 2024-02-09 NOTE — Telephone Encounter (Signed)
-----   Message from Kylie [name] sent at 02/04/2024  6:34 AM EST ----- Lipids at goal continue current therapy and forward to PCP ----- Message ----- From: Interface, [facility] Lab Results In Sent: 02/04/2024  12:36 AM EST To: Kylie JONELLE Bihari, MD

## 2024-02-15 ENCOUNTER — Encounter: Admitting: Surgery

## 2024-02-15 ENCOUNTER — Ambulatory Visit (HOSPITAL_COMMUNITY)

## 2024-02-16 ENCOUNTER — Other Ambulatory Visit: Payer: Self-pay | Admitting: Cardiology

## 2024-02-16 DIAGNOSIS — I272 Pulmonary hypertension, unspecified: Secondary | ICD-10-CM

## 2024-02-16 DIAGNOSIS — I10 Essential (primary) hypertension: Secondary | ICD-10-CM

## 2024-03-15 ENCOUNTER — Ambulatory Visit (HOSPITAL_COMMUNITY)

## 2024-04-14 ENCOUNTER — Ambulatory Visit (HOSPITAL_COMMUNITY)

## 2024-05-05 ENCOUNTER — Ambulatory Visit (HOSPITAL_COMMUNITY)
